# Patient Record
Sex: Male | Born: 1960 | Race: White | Hispanic: No | Marital: Married | Smoking: Never smoker
Health system: Southern US, Community
[De-identification: ages and names within clinical notes are randomized; demographics above are authoritative.]

## PROBLEM LIST (undated history)

## (undated) DIAGNOSIS — E782 Mixed hyperlipidemia: Secondary | ICD-10-CM

## (undated) DIAGNOSIS — K5792 Diverticulitis of intestine, part unspecified, without perforation or abscess without bleeding: Secondary | ICD-10-CM

## (undated) DIAGNOSIS — I2510 Atherosclerotic heart disease of native coronary artery without angina pectoris: Secondary | ICD-10-CM

## (undated) DIAGNOSIS — D171 Benign lipomatous neoplasm of skin and subcutaneous tissue of trunk: Secondary | ICD-10-CM

## (undated) DIAGNOSIS — R0602 Shortness of breath: Secondary | ICD-10-CM

## (undated) DIAGNOSIS — C439 Malignant melanoma of skin, unspecified: Secondary | ICD-10-CM

## (undated) DIAGNOSIS — M1990 Unspecified osteoarthritis, unspecified site: Secondary | ICD-10-CM

## (undated) DIAGNOSIS — I351 Nonrheumatic aortic (valve) insufficiency: Secondary | ICD-10-CM

## (undated) DIAGNOSIS — E785 Hyperlipidemia, unspecified: Secondary | ICD-10-CM

## (undated) DIAGNOSIS — Z8719 Personal history of other diseases of the digestive system: Secondary | ICD-10-CM

## (undated) DIAGNOSIS — I340 Nonrheumatic mitral (valve) insufficiency: Secondary | ICD-10-CM

## (undated) DIAGNOSIS — Z973 Presence of spectacles and contact lenses: Secondary | ICD-10-CM

## (undated) DIAGNOSIS — E119 Type 2 diabetes mellitus without complications: Secondary | ICD-10-CM

## (undated) DIAGNOSIS — N289 Disorder of kidney and ureter, unspecified: Secondary | ICD-10-CM

## (undated) DIAGNOSIS — I10 Essential (primary) hypertension: Secondary | ICD-10-CM

## (undated) DIAGNOSIS — Z85828 Personal history of other malignant neoplasm of skin: Secondary | ICD-10-CM

## (undated) DIAGNOSIS — Z87442 Personal history of urinary calculi: Secondary | ICD-10-CM

## (undated) DIAGNOSIS — C779 Secondary and unspecified malignant neoplasm of lymph node, unspecified: Secondary | ICD-10-CM

## (undated) DIAGNOSIS — K5730 Diverticulosis of large intestine without perforation or abscess without bleeding: Secondary | ICD-10-CM

## (undated) HISTORY — PX: SEPTOPLASTY: SUR1290

## (undated) HISTORY — PX: OTHER SURGICAL HISTORY: SHX169

## (undated) HISTORY — PX: CORONARY ANGIOPLASTY WITH STENT PLACEMENT: SHX49

## (undated) HISTORY — PX: EYE SURGERY: SHX253

## (undated) HISTORY — DX: Malignant melanoma of skin, unspecified: C43.9

## (undated) HISTORY — PX: ULNAR TUNNEL RELEASE: SHX820

## (undated) HISTORY — DX: Atherosclerotic heart disease of native coronary artery without angina pectoris: I25.10

## (undated) HISTORY — DX: Secondary and unspecified malignant neoplasm of lymph node, unspecified: C77.9

## (undated) HISTORY — PX: TONSILLECTOMY: SUR1361

## (undated) HISTORY — DX: Essential (primary) hypertension: I10

## (undated) HISTORY — PX: KNEE ARTHROSCOPY: SUR90

## (undated) HISTORY — DX: Nonrheumatic mitral (valve) insufficiency: I34.0

## (undated) HISTORY — PX: ROTATOR CUFF REPAIR: SHX139

## (undated) HISTORY — DX: Hyperlipidemia, unspecified: E78.5

---

## 1983-07-22 HISTORY — PX: OTHER SURGICAL HISTORY: SHX169

## 2000-12-21 ENCOUNTER — Encounter: Admission: RE | Admit: 2000-12-21 | Discharge: 2001-02-17 | Payer: Self-pay | Admitting: Internal Medicine

## 2001-12-03 ENCOUNTER — Encounter: Payer: Self-pay | Admitting: Emergency Medicine

## 2001-12-03 ENCOUNTER — Observation Stay (HOSPITAL_COMMUNITY): Admission: EM | Admit: 2001-12-03 | Discharge: 2001-12-04 | Payer: Self-pay | Admitting: Emergency Medicine

## 2003-05-26 ENCOUNTER — Encounter: Admission: RE | Admit: 2003-05-26 | Discharge: 2003-05-26 | Payer: Self-pay | Admitting: Internal Medicine

## 2003-06-16 ENCOUNTER — Encounter: Admission: RE | Admit: 2003-06-16 | Discharge: 2003-06-16 | Payer: Self-pay | Admitting: Internal Medicine

## 2003-07-10 ENCOUNTER — Encounter: Admission: RE | Admit: 2003-07-10 | Discharge: 2003-10-08 | Payer: Self-pay | Admitting: Internal Medicine

## 2004-04-26 ENCOUNTER — Emergency Department: Payer: Self-pay | Admitting: Unknown Physician Specialty

## 2004-05-10 ENCOUNTER — Encounter: Admission: RE | Admit: 2004-05-10 | Discharge: 2004-05-10 | Payer: Self-pay | Admitting: Internal Medicine

## 2005-04-09 ENCOUNTER — Emergency Department (HOSPITAL_COMMUNITY): Admission: EM | Admit: 2005-04-09 | Discharge: 2005-04-09 | Payer: Self-pay | Admitting: Emergency Medicine

## 2005-05-15 ENCOUNTER — Encounter: Admission: RE | Admit: 2005-05-15 | Discharge: 2005-05-15 | Payer: Self-pay | Admitting: Family Medicine

## 2005-12-03 ENCOUNTER — Emergency Department (HOSPITAL_COMMUNITY): Admission: EM | Admit: 2005-12-03 | Discharge: 2005-12-04 | Payer: Self-pay | Admitting: Emergency Medicine

## 2006-03-26 ENCOUNTER — Emergency Department (HOSPITAL_COMMUNITY): Admission: EM | Admit: 2006-03-26 | Discharge: 2006-03-26 | Payer: Self-pay | Admitting: Emergency Medicine

## 2006-10-23 ENCOUNTER — Emergency Department (HOSPITAL_COMMUNITY): Admission: EM | Admit: 2006-10-23 | Discharge: 2006-10-23 | Payer: Self-pay | Admitting: Family Medicine

## 2007-06-16 ENCOUNTER — Ambulatory Visit: Payer: Self-pay | Admitting: Oncology

## 2007-07-27 LAB — COMPREHENSIVE METABOLIC PANEL
CO2: 24 mEq/L (ref 19–32)
Calcium: 9.9 mg/dL (ref 8.4–10.5)
Chloride: 104 mEq/L (ref 96–112)
Creatinine, Ser: 1 mg/dL (ref 0.40–1.50)
Glucose, Bld: 90 mg/dL (ref 70–99)
Total Bilirubin: 0.4 mg/dL (ref 0.3–1.2)
Total Protein: 7.6 g/dL (ref 6.0–8.3)

## 2007-07-27 LAB — CBC WITH DIFFERENTIAL/PLATELET
Eosinophils Absolute: 0.1 10*3/uL (ref 0.0–0.5)
HCT: 40.6 % (ref 38.7–49.9)
HGB: 13.9 g/dL (ref 13.0–17.1)
LYMPH%: 29.5 % (ref 14.0–48.0)
MONO#: 0.6 10*3/uL (ref 0.1–0.9)
NEUT#: 2.5 10*3/uL (ref 1.5–6.5)
NEUT%: 53.8 % (ref 40.0–75.0)
Platelets: 150 10*3/uL (ref 145–400)
WBC: 4.6 10*3/uL (ref 4.0–10.0)
lymph#: 1.4 10*3/uL (ref 0.9–3.3)

## 2007-07-27 LAB — LACTATE DEHYDROGENASE: LDH: 159 U/L (ref 94–250)

## 2007-11-25 ENCOUNTER — Encounter: Admission: RE | Admit: 2007-11-25 | Discharge: 2007-11-25 | Payer: Self-pay | Admitting: Family Medicine

## 2007-12-20 ENCOUNTER — Ambulatory Visit: Payer: Self-pay | Admitting: Oncology

## 2008-06-21 ENCOUNTER — Ambulatory Visit: Payer: Self-pay | Admitting: Oncology

## 2008-07-03 ENCOUNTER — Ambulatory Visit (HOSPITAL_COMMUNITY): Admission: RE | Admit: 2008-07-03 | Discharge: 2008-07-03 | Payer: Self-pay | Admitting: Oncology

## 2008-07-10 ENCOUNTER — Encounter (INDEPENDENT_AMBULATORY_CARE_PROVIDER_SITE_OTHER): Payer: Self-pay | Admitting: Diagnostic Radiology

## 2008-07-10 ENCOUNTER — Ambulatory Visit (HOSPITAL_COMMUNITY): Admission: RE | Admit: 2008-07-10 | Discharge: 2008-07-10 | Payer: Self-pay | Admitting: Oncology

## 2008-10-16 ENCOUNTER — Emergency Department (HOSPITAL_COMMUNITY): Admission: EM | Admit: 2008-10-16 | Discharge: 2008-10-16 | Payer: Self-pay | Admitting: Emergency Medicine

## 2008-12-14 ENCOUNTER — Ambulatory Visit: Payer: Self-pay | Admitting: Oncology

## 2008-12-19 LAB — COMPREHENSIVE METABOLIC PANEL
Alkaline Phosphatase: 52 U/L (ref 39–117)
BUN: 10 mg/dL (ref 6–23)
Creatinine, Ser: 0.95 mg/dL (ref 0.40–1.50)
Glucose, Bld: 149 mg/dL — ABNORMAL HIGH (ref 70–99)
Sodium: 140 mEq/L (ref 135–145)
Total Bilirubin: 0.7 mg/dL (ref 0.3–1.2)
Total Protein: 7 g/dL (ref 6.0–8.3)

## 2008-12-19 LAB — CBC WITH DIFFERENTIAL/PLATELET
Eosinophils Absolute: 0.1 10*3/uL (ref 0.0–0.5)
LYMPH%: 34 % (ref 14.0–49.0)
MCV: 88.7 fL (ref 79.3–98.0)
MONO%: 12.2 % (ref 0.0–14.0)
NEUT#: 2 10*3/uL (ref 1.5–6.5)
NEUT%: 50.4 % (ref 39.0–75.0)
Platelets: 115 10*3/uL — ABNORMAL LOW (ref 140–400)
RBC: 4.15 10*6/uL — ABNORMAL LOW (ref 4.20–5.82)

## 2009-02-12 ENCOUNTER — Encounter: Admission: RE | Admit: 2009-02-12 | Discharge: 2009-02-12 | Payer: Self-pay | Admitting: Podiatry

## 2009-06-19 ENCOUNTER — Ambulatory Visit: Payer: Self-pay | Admitting: Oncology

## 2009-06-21 LAB — COMPREHENSIVE METABOLIC PANEL
Albumin: 4.4 g/dL (ref 3.5–5.2)
BUN: 18 mg/dL (ref 6–23)
CO2: 31 mEq/L (ref 19–32)
Glucose, Bld: 188 mg/dL — ABNORMAL HIGH (ref 70–99)
Sodium: 138 mEq/L (ref 135–145)
Total Bilirubin: 0.7 mg/dL (ref 0.3–1.2)
Total Protein: 7.6 g/dL (ref 6.0–8.3)

## 2009-06-21 LAB — CBC WITH DIFFERENTIAL/PLATELET
Basophils Absolute: 0 10*3/uL (ref 0.0–0.1)
Eosinophils Absolute: 0.1 10*3/uL (ref 0.0–0.5)
HCT: 42 % (ref 38.4–49.9)
HGB: 14.6 g/dL (ref 13.0–17.1)
LYMPH%: 29.3 % (ref 14.0–49.0)
MCV: 87.5 fL (ref 79.3–98.0)
MONO#: 0.5 10*3/uL (ref 0.1–0.9)
MONO%: 9.8 % (ref 0.0–14.0)
NEUT#: 3 10*3/uL (ref 1.5–6.5)
Platelets: 122 10*3/uL — ABNORMAL LOW (ref 140–400)
RBC: 4.8 10*6/uL (ref 4.20–5.82)
WBC: 5.2 10*3/uL (ref 4.0–10.3)

## 2009-06-21 LAB — LACTATE DEHYDROGENASE: LDH: 149 U/L (ref 94–250)

## 2009-12-21 ENCOUNTER — Ambulatory Visit: Payer: Self-pay | Admitting: Oncology

## 2009-12-25 LAB — CBC WITH DIFFERENTIAL/PLATELET
Basophils Absolute: 0 10*3/uL (ref 0.0–0.1)
EOS%: 2.1 % (ref 0.0–7.0)
Eosinophils Absolute: 0.1 10*3/uL (ref 0.0–0.5)
HGB: 13.9 g/dL (ref 13.0–17.1)
NEUT#: 2.4 10*3/uL (ref 1.5–6.5)
RBC: 4.55 10*6/uL (ref 4.20–5.82)
RDW: 12.9 % (ref 11.0–14.6)
lymph#: 1.3 10*3/uL (ref 0.9–3.3)

## 2009-12-25 LAB — COMPREHENSIVE METABOLIC PANEL
AST: 24 U/L (ref 0–37)
Albumin: 4.6 g/dL (ref 3.5–5.2)
Alkaline Phosphatase: 70 U/L (ref 39–117)
BUN: 14 mg/dL (ref 6–23)
Calcium: 9.4 mg/dL (ref 8.4–10.5)
Chloride: 102 mEq/L (ref 96–112)
Glucose, Bld: 367 mg/dL — ABNORMAL HIGH (ref 70–99)
Potassium: 4.1 mEq/L (ref 3.5–5.3)
Sodium: 136 mEq/L (ref 135–145)
Total Protein: 7.5 g/dL (ref 6.0–8.3)

## 2009-12-26 ENCOUNTER — Emergency Department (HOSPITAL_COMMUNITY): Admission: EM | Admit: 2009-12-26 | Discharge: 2009-12-26 | Payer: Self-pay | Admitting: Emergency Medicine

## 2009-12-27 ENCOUNTER — Emergency Department (HOSPITAL_COMMUNITY): Admission: EM | Admit: 2009-12-27 | Discharge: 2009-12-27 | Payer: Self-pay | Admitting: Emergency Medicine

## 2010-02-20 ENCOUNTER — Encounter: Admission: RE | Admit: 2010-02-20 | Discharge: 2010-02-20 | Payer: Self-pay | Admitting: Orthopedic Surgery

## 2010-04-13 ENCOUNTER — Emergency Department (HOSPITAL_COMMUNITY): Admission: EM | Admit: 2010-04-13 | Discharge: 2010-04-13 | Payer: Self-pay | Admitting: Emergency Medicine

## 2010-05-08 ENCOUNTER — Encounter: Admission: RE | Admit: 2010-05-08 | Discharge: 2010-05-08 | Payer: Self-pay | Admitting: Family Medicine

## 2010-07-03 ENCOUNTER — Ambulatory Visit: Payer: Self-pay | Admitting: Oncology

## 2010-07-05 LAB — CBC WITH DIFFERENTIAL/PLATELET
BASO%: 0.4 % (ref 0.0–2.0)
EOS%: 2.3 % (ref 0.0–7.0)
Eosinophils Absolute: 0.1 10*3/uL (ref 0.0–0.5)
LYMPH%: 31.2 % (ref 14.0–49.0)
MCH: 30.7 pg (ref 27.2–33.4)
MCHC: 34.5 g/dL (ref 32.0–36.0)
MCV: 89 fL (ref 79.3–98.0)
MONO%: 11.8 % (ref 0.0–14.0)
NEUT#: 2.3 10*3/uL (ref 1.5–6.5)
Platelets: 110 10*3/uL — ABNORMAL LOW (ref 140–400)
RBC: 4.33 10*6/uL (ref 4.20–5.82)
RDW: 13.3 % (ref 11.0–14.6)

## 2010-07-05 LAB — COMPREHENSIVE METABOLIC PANEL
ALT: 41 U/L (ref 0–53)
AST: 25 U/L (ref 0–37)
Albumin: 4.1 g/dL (ref 3.5–5.2)
Alkaline Phosphatase: 57 U/L (ref 39–117)
Glucose, Bld: 291 mg/dL — ABNORMAL HIGH (ref 70–99)
Potassium: 4.4 mEq/L (ref 3.5–5.3)
Sodium: 138 mEq/L (ref 135–145)
Total Bilirubin: 0.5 mg/dL (ref 0.3–1.2)
Total Protein: 7.1 g/dL (ref 6.0–8.3)

## 2010-08-11 ENCOUNTER — Encounter: Payer: Self-pay | Admitting: Oncology

## 2010-08-12 ENCOUNTER — Encounter: Payer: Self-pay | Admitting: Oncology

## 2010-09-18 ENCOUNTER — Emergency Department (HOSPITAL_COMMUNITY): Payer: BC Managed Care – PPO

## 2010-09-18 ENCOUNTER — Emergency Department (HOSPITAL_COMMUNITY)
Admission: EM | Admit: 2010-09-18 | Discharge: 2010-09-18 | Disposition: A | Discharge status: Home / Self Care | Payer: BC Managed Care – PPO | Attending: Emergency Medicine | Admitting: Emergency Medicine

## 2010-09-18 DIAGNOSIS — E119 Type 2 diabetes mellitus without complications: Secondary | ICD-10-CM | POA: Insufficient documentation

## 2010-09-18 DIAGNOSIS — N201 Calculus of ureter: Secondary | ICD-10-CM | POA: Insufficient documentation

## 2010-09-18 DIAGNOSIS — N1330 Unspecified hydronephrosis: Secondary | ICD-10-CM | POA: Insufficient documentation

## 2010-09-18 DIAGNOSIS — R109 Unspecified abdominal pain: Secondary | ICD-10-CM | POA: Insufficient documentation

## 2010-09-18 DIAGNOSIS — E7800 Pure hypercholesterolemia, unspecified: Secondary | ICD-10-CM | POA: Insufficient documentation

## 2010-09-18 DIAGNOSIS — M549 Dorsalgia, unspecified: Secondary | ICD-10-CM | POA: Insufficient documentation

## 2010-09-18 DIAGNOSIS — Z79899 Other long term (current) drug therapy: Secondary | ICD-10-CM | POA: Insufficient documentation

## 2010-09-18 LAB — URINALYSIS, ROUTINE W REFLEX MICROSCOPIC
Bilirubin Urine: NEGATIVE
Protein, ur: NEGATIVE mg/dL
Urine Glucose, Fasting: NEGATIVE mg/dL
Urobilinogen, UA: 0.2 mg/dL (ref 0.0–1.0)

## 2010-09-18 LAB — POCT I-STAT, CHEM 8
Calcium, Ion: 1.17 mmol/L (ref 1.12–1.32)
Glucose, Bld: 128 mg/dL — ABNORMAL HIGH (ref 70–99)
HCT: 43 % (ref 39.0–52.0)
Hemoglobin: 14.6 g/dL (ref 13.0–17.0)

## 2010-09-18 LAB — URINE MICROSCOPIC-ADD ON

## 2010-09-19 HISTORY — PX: EXTRACORPOREAL SHOCK WAVE LITHOTRIPSY: SHX1557

## 2010-09-26 ENCOUNTER — Ambulatory Visit (HOSPITAL_COMMUNITY)
Admission: RE | Admit: 2010-09-26 | Discharge: 2010-09-26 | Disposition: A | Discharge status: Home / Self Care | Payer: BC Managed Care – PPO | Source: Ambulatory Visit | Attending: Urology | Admitting: Urology

## 2010-09-26 DIAGNOSIS — E119 Type 2 diabetes mellitus without complications: Secondary | ICD-10-CM | POA: Insufficient documentation

## 2010-09-26 DIAGNOSIS — N201 Calculus of ureter: Secondary | ICD-10-CM | POA: Insufficient documentation

## 2010-09-26 LAB — GLUCOSE, CAPILLARY: Glucose-Capillary: 122 mg/dL — ABNORMAL HIGH (ref 70–99)

## 2010-10-03 LAB — URINALYSIS, ROUTINE W REFLEX MICROSCOPIC
Bilirubin Urine: NEGATIVE
Protein, ur: NEGATIVE mg/dL
Specific Gravity, Urine: 1.022 (ref 1.005–1.030)
Urobilinogen, UA: 0.2 mg/dL (ref 0.0–1.0)

## 2010-10-03 LAB — URINE CULTURE: Culture  Setup Time: 201109241749

## 2010-10-03 LAB — POCT I-STAT, CHEM 8
Creatinine, Ser: 1.1 mg/dL (ref 0.4–1.5)
Glucose, Bld: 179 mg/dL — ABNORMAL HIGH (ref 70–99)
Hemoglobin: 15.3 g/dL (ref 13.0–17.0)
TCO2: 30 mmol/L (ref 0–100)

## 2010-10-03 LAB — URINE MICROSCOPIC-ADD ON

## 2010-10-07 LAB — URINALYSIS, ROUTINE W REFLEX MICROSCOPIC
Bilirubin Urine: NEGATIVE
Glucose, UA: 250 mg/dL — AB
Glucose, UA: 500 mg/dL — AB
Hgb urine dipstick: NEGATIVE
Ketones, ur: NEGATIVE mg/dL
Leukocytes, UA: NEGATIVE
Nitrite: NEGATIVE
Nitrite: NEGATIVE
Specific Gravity, Urine: 1.026 (ref 1.005–1.030)
pH: 5 (ref 5.0–8.0)
pH: 5 (ref 5.0–8.0)

## 2010-10-07 LAB — URINE MICROSCOPIC-ADD ON

## 2010-10-07 LAB — POCT I-STAT, CHEM 8
BUN: 15 mg/dL (ref 6–23)
Chloride: 106 mEq/L (ref 96–112)
HCT: 42 % (ref 39.0–52.0)
Potassium: 4.6 mEq/L (ref 3.5–5.1)
Sodium: 138 mEq/L (ref 135–145)

## 2010-10-07 LAB — GLUCOSE, CAPILLARY

## 2010-10-31 LAB — POCT I-STAT, CHEM 8
BUN: 16 mg/dL (ref 6–23)
Chloride: 104 mEq/L (ref 96–112)
Creatinine, Ser: 1 mg/dL (ref 0.4–1.5)
Potassium: 3.9 mEq/L (ref 3.5–5.1)
Sodium: 138 mEq/L (ref 135–145)
TCO2: 25 mmol/L (ref 0–100)

## 2010-10-31 LAB — URINALYSIS, ROUTINE W REFLEX MICROSCOPIC
Bilirubin Urine: NEGATIVE
Hgb urine dipstick: NEGATIVE
Ketones, ur: 15 mg/dL — AB
Nitrite: NEGATIVE
Urobilinogen, UA: 1 mg/dL (ref 0.0–1.0)

## 2010-10-31 LAB — COMPREHENSIVE METABOLIC PANEL
ALT: 26 U/L (ref 0–53)
CO2: 27 mEq/L (ref 19–32)
Calcium: 9.3 mg/dL (ref 8.4–10.5)
Creatinine, Ser: 1 mg/dL (ref 0.4–1.5)
GFR calc Af Amer: >60 mL/min (ref >60)
GFR calc non Af Amer: >60 mL/min (ref >60)
Glucose, Bld: 233 mg/dL — ABNORMAL HIGH (ref 70–99)
Sodium: 138 mEq/L (ref 135–145)
Total Protein: 7 g/dL (ref 6.0–8.3)

## 2010-10-31 LAB — DIFFERENTIAL
Eosinophils Absolute: 0.2 10*3/uL (ref 0.0–0.7)
Lymphocytes Relative: 35 % (ref 12–46)
Lymphs Abs: 1.8 10*3/uL (ref 0.7–4.0)
Monocytes Relative: 10 % (ref 3–12)
Neutro Abs: 2.6 10*3/uL (ref 1.7–7.7)
Neutrophils Relative %: 51 % (ref 43–77)

## 2010-10-31 LAB — CBC
Hemoglobin: 13.8 g/dL (ref 13.0–17.0)
MCHC: 34.2 g/dL (ref 30.0–36.0)
MCV: 90.9 fL (ref 78.0–100.0)
RBC: 4.42 MIL/uL (ref 4.22–5.81)
RDW: 12.9 % (ref 11.5–15.5)

## 2010-10-31 LAB — LIPASE, BLOOD: Lipase: 37 U/L (ref 11–59)

## 2010-12-06 NOTE — H&P (Signed)
[place]  Patient:    Roy [name], Roy [name] Visit Number: [id number] MRN: [id number]          Service Type: MED Location: [phone] Attending Physician:  Marcelino Duster Md Dictated by:   Arturo Morton Riley Kill, M.D. [facility] Admit Date:  12/03/2001 Discharge Date: 12/04/2001   CC:         [facility]   History and Physical  CHIEF COMPLAINT:  Squeezing chest pain.  HISTORY OF PRESENT ILLNESS:  The patient is a 50 year old white male, father of three, with hypercholesterolemia and a positive family history.  The patient was upset at work because of a job change and developed some squeezing chest pain.  He has been exercising on the elliptical bike without any symptoms for 10 to 15 minutes and jumping rope without any difficulty.  This has been occurring for about two to three weeks and it was also brought on by an episode with a coach of his sons little league team.  He now feels fine for the past 36 hours.  He denies exertional chest pain.  This was described as a squeezing in the right parasternal area.  PAST MEDICAL HISTORY:  The patient hashad prior septoplasty.  He has had arm surgery.  He has had hypercholesterolemia.  The patient has had radial keratotomy to both eyes.  FAMILY HISTORY:  Father had PCI at age 1.  Mother has hypertension and lung cancer.  Paternal grandfather had CAD and died of an MI at 10.  He has two brothers who are alive and well.  REVIEW OF SYSTEMS:  Noncontributory.  He does have a history of migraines. His weight has been stable.  ALLERGIES:  ALCOHOL with hives.  CARDIAC RISK FACTORS:  The patient does not smoke.  There is no diabetes or hypertension.  PHYSICAL EXAMINATION:  GENERAL:  The patient is alert and oriented.  VITAL SIGNS:  The blood pressure is 155/92.  The pulse is 60 and regular.  He is afebrile.  Respiratory rate is 14 and unlabored.  NECK:  Carotid upstrokes are brisk.  There are no  bruits.  LUNGS:  Lung fields are clear to auscultation and percussion.  CARDIAC:  Thereis a normal first and second heart sound.  There is questionable S4.  ABDOMEN:  No hepatosplenomegaly.  EXTREMITIES:  No edema.  NEUROLOGIC:  Exam is nonfocal.  LABORATORY AND ACCESSORY DATA:  Review of the serial electrocardiograms demonstrates normal sinus rhythm with no acute changes.  There is borderline nonspecific T wave flattening in the final two EKGs which is nonspecific.  Laboratory data reveals normal CKs and troponins.  IMPRESSION: 1. Chest discomfort, ? cardiac in etiology or anxiety related. 2. Positive family history for coronary artery disease. 3. History of hypercholesterolemia.  PLAN:  I have discussed the plan with Dr. Shirlee Limerick.  I would recommend the patient have an in-hospital Cardiolite study but the patient is not willingto stay and wants to have this done as an outpatient.  He was informed of the potential issues and he understands them.  I would add an aspirin to his daily regimen.  I also think he should have lipid levels measured by his primary care physician, and initiation of statin therapy, if in fact he mets criteria.  I have passed this on to Dr. Letitia Libra to pass for his primary care physician, should he have problems in the ______ .  We will tryto arrange an outpatient Cardiolite as soon as it  can possibly be arranged.  ADDENDUM:  He was informed that arranging the outpatient study from a timing standpoint was uncertain. Dictated by:   Arturo Morton Riley Kill, M.D. [facility] Attending Physician:  Marcelino Duster Md DD:  12/04/01 TD:  12/05/01 Job: [id number]

## 2011-04-25 LAB — CBC
HCT: 38.2 % — ABNORMAL LOW (ref 39.0–52.0)
Hemoglobin: 12.8 g/dL — ABNORMAL LOW (ref 13.0–17.0)
MCV: 91.6 fL (ref 78.0–100.0)
WBC: 3.9 10*3/uL — ABNORMAL LOW (ref 4.0–10.5)

## 2011-04-25 LAB — CREATININE, SERUM: GFR calc non Af Amer: >60 mL/min (ref >60)

## 2011-04-25 LAB — BUN: BUN: 17 mg/dL (ref 6–23)

## 2011-04-25 LAB — GLUCOSE, CAPILLARY
Glucose-Capillary: 155 mg/dL — ABNORMAL HIGH (ref 70–99)
Glucose-Capillary: 197 mg/dL — ABNORMAL HIGH (ref 70–99)

## 2011-07-02 ENCOUNTER — Encounter: Payer: Self-pay | Admitting: *Deleted

## 2011-07-04 ENCOUNTER — Other Ambulatory Visit: Payer: Self-pay | Admitting: Oncology

## 2011-07-04 ENCOUNTER — Ambulatory Visit (HOSPITAL_BASED_OUTPATIENT_CLINIC_OR_DEPARTMENT_OTHER): Payer: BC Managed Care – PPO | Admitting: Oncology

## 2011-07-04 ENCOUNTER — Telehealth: Payer: Self-pay | Admitting: Oncology

## 2011-07-04 ENCOUNTER — Other Ambulatory Visit (HOSPITAL_BASED_OUTPATIENT_CLINIC_OR_DEPARTMENT_OTHER): Payer: BC Managed Care – PPO | Admitting: Lab

## 2011-07-04 VITALS — BP 138/88 | HR 95 | Temp 97.2°F | Ht 69.5 in | Wt 194.6 lb

## 2011-07-04 DIAGNOSIS — C8589 Other specified types of non-Hodgkin lymphoma, extranodal and solid organ sites: Secondary | ICD-10-CM

## 2011-07-04 DIAGNOSIS — C8299 Follicular lymphoma, unspecified, extranodal and solid organ sites: Secondary | ICD-10-CM

## 2011-07-04 DIAGNOSIS — C8590 Non-Hodgkin lymphoma, unspecified, unspecified site: Secondary | ICD-10-CM

## 2011-07-04 LAB — COMPREHENSIVE METABOLIC PANEL
AST: 17 U/L (ref 0–37)
Albumin: 4.3 g/dL (ref 3.5–5.2)
BUN: 10 mg/dL (ref 6–23)
CO2: 30 mEq/L (ref 19–32)
Calcium: 9.9 mg/dL (ref 8.4–10.5)
Chloride: 99 mEq/L (ref 96–112)
Creatinine, Ser: 0.92 mg/dL (ref 0.50–1.35)
Glucose, Bld: 306 mg/dL — ABNORMAL HIGH (ref 70–99)
Potassium: 4.1 mEq/L (ref 3.5–5.3)

## 2011-07-04 LAB — CBC WITH DIFFERENTIAL/PLATELET
Basophils Absolute: 0 10*3/uL (ref 0.0–0.1)
EOS%: 1.4 % (ref 0.0–7.0)
Eosinophils Absolute: 0.1 10*3/uL (ref 0.0–0.5)
HCT: 40.2 % (ref 38.4–49.9)
HGB: 13.9 g/dL (ref 13.0–17.1)
MONO#: 0.4 10*3/uL (ref 0.1–0.9)
NEUT#: 2.3 10*3/uL (ref 1.5–6.5)
NEUT%: 56.3 % (ref 39.0–75.0)
RDW: 12.8 % (ref 11.0–14.6)
WBC: 4 10*3/uL (ref 4.0–10.3)
lymph#: 1.3 10*3/uL (ref 0.9–3.3)

## 2011-07-04 LAB — LACTATE DEHYDROGENASE: LDH: 155 U/L (ref 94–250)

## 2011-07-04 NOTE — Progress Notes (Signed)
Hematology and Oncology Follow Up Visit  Roy [name] [id number] [birth year] 50 y.o. 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  He has been on active surveillance since 2009, due to asymptomatic lymphadenopathy.    Current therapy: Observation and surveillance.   Interim History: Roy [name] presents today for a follow-up visit.  We continued to follow Roy [name] for a low-grade follicular lymphoma that he had since 2007, according to our surveillance, but probably even further that according to what he was telling me that he has had lymphadenopathy dating back for the last 10 years.  He otherwise has not reported any symptoms of abdominal pain or distention.  He has not reported any early satiety.  He did not report any constitutional symptoms of fevers, chills, sweats or difficulty performing activity of daily living.  He has continued to work full time and really be completely asymptomatic at this time. He did have multiple kidney stones since 08/2010. Last CT scan  At that time showed no progression of his lymph nodes.   Medications: I have reviewed the patient's current medications. Current outpatient prescriptions:benazepril (LOTENSIN) 10 MG tablet, Take 10 mg by mouth daily.  , Disp: , Rfl: ;  metFORMIN (GLUCOPHAGE) 500 MG tablet, Take 500 mg by mouth 2 (two) times daily with a meal.  , Disp: , Rfl: ;  rosuvastatin (CRESTOR) 20 MG tablet, Take 10 mg by mouth daily. , Disp: , Rfl: ;  sitaGLIPtin (JANUVIA) 100 MG tablet, Take 100 mg by mouth daily.  , Disp: , Rfl:  glimepiride (AMARYL) 2 MG tablet, Take 2 mg by mouth daily before breakfast.  , Disp: , Rfl:   Allergies: Allergies no known allergies  Past Medical History, Surgical history, Social history, and Family History were reviewed and updated.  Review of  Systems: Constitutional:  Negative for fever, chills, night sweats, anorexia, weight loss, pain. Cardiovascular: no chest pain or dyspnea on exertion Respiratory: no cough, shortness of breath, or wheezing Neurological: no TIA or stroke symptoms Dermatological: negative ENT: negative Skin: Negative. Gastrointestinal: no abdominal pain, change in bowel habits, or black or bloody stools Genito-Urinary: no dysuria, trouble voiding, or hematuria Hematological and Lymphatic: negative Breast: negative Musculoskeletal: negative Remaining ROS negative. Physical Exam: Blood pressure 138/88, pulse 95, temperature 97.2 F (36.2 C), temperature source Oral, height 5' 9.5" (1.765 m), weight 194 lb 9.6 oz (88.27 kg). ECOG: 0 General appearance: alert Head: Normocephalic, without obvious abnormality, atraumatic Neck: no adenopathy, no carotid bruit, no JVD, supple, symmetrical, trachea midline and thyroid not enlarged, symmetric, no tenderness/mass/nodules Lymph nodes: Cervical, supraclavicular, and axillary nodes normal. Heart:regular rate and rhythm, S1, S2 normal, no murmur, click, rub or gallop Lung:chest clear, no wheezing, rales, normal symmetric air entry Abdomin: soft, non-tender, without masses or organomegaly EXT:no erythema, induration, or nodules   Lab Results: Lab Results  Component Value Date   WBC 4.0 07/04/2011   HGB 13.9 07/04/2011   HCT 40.2 07/04/2011   MCV 88.4 07/04/2011   PLT 120* 07/04/2011     Chemistry      Component Value Date/Time   NA 141 09/18/2010 0203   K 3.7 09/18/2010 0203   CL 101 09/18/2010 0203   CO2 30 07/05/2010 1529   BUN 17 09/18/2010 0203   CREATININE 1.2 09/18/2010 0203      Component  Value Date/Time   CALCIUM 9.5 07/05/2010 1529   ALKPHOS 57 07/05/2010 1529   AST 25 07/05/2010 1529   ALT 41 07/05/2010 1529   BILITOT 0.5 07/05/2010 1529         Impression and Plan:   A 50 year old gentleman with the following issues: 1.  [name].  He has continued to be asymptomatic from that standpoint.  I continued to educate Roy [name] about the signs and symptoms of progressive lymphoma, B symptoms and transformation that would include, but not limited to fevers, chills, unexpected weight loss, unexpected fatigue, progressive cytopenias, bleeding and shortness of breath.  All of these were educated and reiterated to Roy [name].  He is to report to me any symptoms between visits. 2.  Diabetes under reasonable control with Roy [name]. 3.  Followup will be in 9 months or sooner if needed to.    [facility], MD 12/14/20124:17 PM

## 2011-07-04 NOTE — Telephone Encounter (Signed)
gve the pt his sept 2013 appt calendar along with the ct scan appt

## 2011-07-22 DIAGNOSIS — I252 Old myocardial infarction: Secondary | ICD-10-CM

## 2011-07-22 HISTORY — DX: Old myocardial infarction: I25.2

## 2011-12-20 ENCOUNTER — Inpatient Hospital Stay: Payer: Self-pay | Admitting: Internal Medicine

## 2011-12-20 LAB — CBC
HGB: 14.2 g/dL (ref 13.0–18.0)
MCHC: 33.7 g/dL (ref 32.0–36.0)
MCV: 88 fL (ref 80–100)
RBC: 4.78 10*6/uL (ref 4.40–5.90)
RDW: 13.6 % (ref 11.5–14.5)
WBC: 4 10*3/uL (ref 3.8–10.6)

## 2011-12-20 LAB — COMPREHENSIVE METABOLIC PANEL
Albumin: 4.4 g/dL (ref 3.4–5.0)
Anion Gap: 10 (ref 7–16)
BUN: 15 mg/dL (ref 7–18)
Bilirubin,Total: 0.5 mg/dL (ref 0.2–1.0)
Calcium, Total: 9.6 mg/dL (ref 8.5–10.1)
Chloride: 102 mmol/L (ref 98–107)
Creatinine: 1.16 mg/dL (ref 0.60–1.30)
EGFR (African American): >60
Osmolality: 290 (ref 275–301)
Potassium: 4.9 mmol/L (ref 3.5–5.1)
SGOT(AST): 20 U/L (ref 15–37)
SGPT (ALT): 34 U/L
Sodium: 139 mmol/L (ref 136–145)
Total Protein: 8.2 g/dL (ref 6.4–8.2)

## 2011-12-20 LAB — PROTIME-INR: INR: 1

## 2011-12-21 DIAGNOSIS — R079 Chest pain, unspecified: Secondary | ICD-10-CM

## 2011-12-21 LAB — CBC WITH DIFFERENTIAL/PLATELET
Basophil #: 0 10*3/uL (ref 0.0–0.1)
Basophil %: 0.4 %
Eosinophil #: 0.1 10*3/uL (ref 0.0–0.7)
Eosinophil %: 2.6 %
HCT: 35.4 % — ABNORMAL LOW (ref 40.0–52.0)
HGB: 12.1 g/dL — ABNORMAL LOW (ref 13.0–18.0)
Lymphocyte #: 1.9 10*3/uL (ref 1.0–3.6)
Lymphocyte %: 44.7 %
MCH: 29.8 pg (ref 26.0–34.0)
MCHC: 34.2 g/dL (ref 32.0–36.0)
MCV: 87 fL (ref 80–100)
Monocyte #: 0.4 x10 3/mm (ref 0.2–1.0)
Monocyte %: 10.5 %
Neutrophil %: 41.8 %
Platelet: 101 10*3/uL — ABNORMAL LOW (ref 150–440)
RBC: 4.06 10*6/uL — ABNORMAL LOW (ref 4.40–5.90)
WBC: 4.2 10*3/uL (ref 3.8–10.6)

## 2011-12-21 LAB — LIPID PANEL
HDL Cholesterol: 37 mg/dL — ABNORMAL LOW (ref 40–60)
Triglycerides: 64 mg/dL (ref 0–200)
VLDL Cholesterol, Calc: 13 mg/dL (ref 5–40)

## 2011-12-21 LAB — BASIC METABOLIC PANEL
Anion Gap: 10 (ref 7–16)
BUN: 11 mg/dL (ref 7–18)
Chloride: 107 mmol/L (ref 98–107)
Co2: 25 mmol/L (ref 21–32)
Creatinine: 0.95 mg/dL (ref 0.60–1.30)
EGFR (African American): >60
EGFR (Non-African Amer.): >60
Glucose: 191 mg/dL — ABNORMAL HIGH (ref 65–99)
Osmolality: 288 (ref 275–301)

## 2011-12-21 LAB — TROPONIN I: Troponin-I: 1.77 ng/mL — ABNORMAL HIGH

## 2011-12-21 LAB — HEMOGLOBIN A1C: Hemoglobin A1C: 10.7 % — ABNORMAL HIGH (ref 4.2–6.3)

## 2011-12-21 LAB — PROTIME-INR
INR: 1
Prothrombin Time: 13.3 secs (ref 11.5–14.7)

## 2011-12-21 LAB — CK TOTAL AND CKMB (NOT AT ARMC)
CK, Total: 146 U/L (ref 35–232)
CK-MB: 7.5 ng/mL — ABNORMAL HIGH (ref 0.5–3.6)
CK-MB: 6.9 ng/mL — ABNORMAL HIGH (ref 0.5–3.6)

## 2011-12-21 LAB — APTT: Activated PTT: 128.3 secs — ABNORMAL HIGH (ref 23.6–35.9)

## 2011-12-22 DIAGNOSIS — I2510 Atherosclerotic heart disease of native coronary artery without angina pectoris: Secondary | ICD-10-CM

## 2011-12-22 HISTORY — PX: CARDIAC CATHETERIZATION: SHX172

## 2011-12-22 LAB — APTT
Activated PTT: >160 secs (ref 23.6–35.9)
Activated PTT: 72.5 secs — ABNORMAL HIGH (ref 23.6–35.9)

## 2011-12-23 ENCOUNTER — Encounter: Payer: Self-pay | Admitting: Cardiovascular Disease

## 2011-12-23 DIAGNOSIS — I214 Non-ST elevation (NSTEMI) myocardial infarction: Secondary | ICD-10-CM

## 2011-12-23 LAB — BASIC METABOLIC PANEL
Anion Gap: 9 (ref 7–16)
BUN: 15 mg/dL (ref 7–18)
Calcium, Total: 8.1 mg/dL — ABNORMAL LOW (ref 8.5–10.1)
Co2: 25 mmol/L (ref 21–32)
Creatinine: 0.94 mg/dL (ref 0.60–1.30)
EGFR (Non-African Amer.): >60
Osmolality: 283 (ref 275–301)
Potassium: 3.9 mmol/L (ref 3.5–5.1)
Sodium: 137 mmol/L (ref 136–145)

## 2011-12-23 LAB — CK TOTAL AND CKMB (NOT AT ARMC): CK, Total: 64 U/L (ref 35–232)

## 2011-12-23 LAB — PLATELET COUNT: Platelet: 130 10*3/uL — ABNORMAL LOW (ref 150–440)

## 2012-01-08 ENCOUNTER — Other Ambulatory Visit: Payer: BC Managed Care – PPO

## 2012-01-08 ENCOUNTER — Ambulatory Visit (INDEPENDENT_AMBULATORY_CARE_PROVIDER_SITE_OTHER): Payer: BC Managed Care – PPO | Admitting: Cardiovascular Disease

## 2012-01-08 ENCOUNTER — Encounter: Payer: Self-pay | Admitting: Cardiovascular Disease

## 2012-01-08 VITALS — BP 130/80 | HR 83 | Ht 69.5 in | Wt 183.0 lb

## 2012-01-08 DIAGNOSIS — R079 Chest pain, unspecified: Secondary | ICD-10-CM

## 2012-01-08 DIAGNOSIS — I2510 Atherosclerotic heart disease of native coronary artery without angina pectoris: Secondary | ICD-10-CM | POA: Insufficient documentation

## 2012-01-08 DIAGNOSIS — I10 Essential (primary) hypertension: Secondary | ICD-10-CM | POA: Insufficient documentation

## 2012-01-08 DIAGNOSIS — E119 Type 2 diabetes mellitus without complications: Secondary | ICD-10-CM

## 2012-01-08 DIAGNOSIS — E785 Hyperlipidemia, unspecified: Secondary | ICD-10-CM | POA: Insufficient documentation

## 2012-01-08 DIAGNOSIS — Z9861 Coronary angioplasty status: Secondary | ICD-10-CM

## 2012-01-08 MED ORDER — ATORVASTATIN CALCIUM 20 MG PO TABS: 20.0000 mg | ORAL_TABLET | Freq: Every day | ORAL | Status: DC

## 2012-01-08 NOTE — Assessment & Plan Note (Signed)
Recent drug-eluting stent to the OM1, 95% lesion. Residual 60-70% mid to distal left circumflex disease. We will discuss the residual disease with interventional cardiology to determine if intervention needed. He is currently asymptomatic.

## 2012-01-08 NOTE — Patient Instructions (Addendum)
You are doing well. When you run out of crestor, please start lipitor one a day (20 mg) Cholesterol check with Dr. Cliffton Asters in three months  At the beginning of Sept. decrease the aspirin to 81 mg x 2 with plavix  Please call us if you have new issues that need to be addressed before your next appt.  Your physician wants you to follow-up in: 6 months.  You will receive a reminder letter in the mail two months in advance. If you don't receive a letter, please call our office to schedule the follow-up appointment.

## 2012-01-08 NOTE — Progress Notes (Signed)
Patient ID: Roy [name], male    DOB: July 23, 1960, 51 y.o.   MRN: [id number]  HPI Comments: Roy [name] is a pleasant 51 year old gentleman with poorly controlled diabetes, hyperlipidemia, hypertension who presented to the emergency room 12/20/2011 with chest pain, elevated cardiac enzymes greater than 2, cardiac catheterization showing severe OM1 disease as well as moderate to severe , 60-70% mid left circumflex disease. Promus stance 3.0 x 8 mm was placed to the OM1. He was discharged with followup today.  Cholesterol in the hospital was 125, LDL 75, HDL 37, hemoglobin A1c 10.7  He reports that his diabetes is slowly improving. He was previously on prednisone for a neck infection. Hemoglobin A1c at that time was 13. Subsequently it has dropped to 10, now 9.8. He is trying to watch his diet. His weight has decreased from 215 pounds to 182 pounds on a diet supplement.  He reports having vague short-lived chest pain on the left and on the right since his discharge. Not associated with exertion. Otherwise he feels well with no complaints. Previous symptoms were jaw pain and left arm pain. This resolved after the catheterization and stent placement.  EKG shows normal sinus rhythm with rate 83 beats per minute with no significant ST or T wave changes   Outpatient Encounter Prescriptions as of 01/08/2012  Medication Sig Dispense Refill  . aspirin 325 MG tablet Take 325 mg by mouth daily.      . benazepril (LOTENSIN) 10 MG tablet Take 10 mg by mouth daily.        . clopidogrel (PLAVIX) 75 MG tablet Take 75 mg by mouth daily.      Marland Kitchen ibuprofen (ADVIL,MOTRIN) 200 MG tablet Take 200 mg by mouth every 6 (six) hours as needed.      . metoprolol succinate (TOPROL-XL) 25 MG 24 hr tablet Take 1 tablet by mouth Daily.      Marland Kitchen NITROSTAT 0.4 MG SL tablet Place 0.4 mg under the tongue every 5 (five) minutes as needed.       . pioglitazone (ACTOS) 15 MG tablet Take 15 mg by mouth daily.      . sitaGLIPtan-metformin  (JANUMET) 50-1000 MG per tablet Take 2 tablets by mouth 2 (two) times daily with a meal.      . tetrahydrozoline-zinc (VISINE-AC) 0.05-0.25 % ophthalmic solution Place 2 drops into both eyes 3 (three) times daily as needed.      .  rosuvastatin (CRESTOR) 20 MG tablet Take 10 mg by mouth daily.        Review of Systems  Constitutional: Negative.   HENT: Negative.   Eyes: Negative.   Respiratory: Negative.   Cardiovascular: Negative.   Gastrointestinal: Negative.   Musculoskeletal: Negative.   Skin: Negative.   Neurological: Negative.   Hematological: Negative.   Psychiatric/Behavioral: Negative.   All other systems reviewed and are negative.    BP 130/80  Pulse 83  Ht 5' 9.5" (1.765 m)  Wt 183 lb (83.008 kg)  BMI 26.64 kg/m2  Physical Exam  Nursing note and vitals reviewed. Constitutional: He is oriented to person, place, and time. He appears well-developed and well-nourished.  HENT:  Head: Normocephalic.  Nose: Nose normal.  Mouth/Throat: Oropharynx is clear and moist.  Eyes: Conjunctivae are normal. Pupils are equal, round, and reactive to light.  Neck: Normal range of motion. Neck supple. No JVD present.  Cardiovascular: Normal rate, regular rhythm, S1 normal, S2 normal, normal heart sounds and intact distal pulses.  Exam reveals no  gallop and no friction rub.   No murmur heard. Pulmonary/Chest: Effort normal and breath sounds normal. No respiratory distress. He has no wheezes. He has no rales. He exhibits no tenderness.  Abdominal: Soft. Bowel sounds are normal. He exhibits no distension. There is no tenderness.  Musculoskeletal: Normal range of motion. He exhibits no edema and no tenderness.  Lymphadenopathy:    He has no cervical adenopathy.  Neurological: He is alert and oriented to person, place, and time. Coordination normal.  Skin: Skin is warm and dry. No rash noted. No erythema.  Psychiatric: He has a normal mood and affect. His behavior is normal. Judgment and  thought content normal.           Assessment and Plan

## 2012-01-08 NOTE — Assessment & Plan Note (Signed)
Blood pressure is well controlled on today's visit. No changes made to the medications. 

## 2012-01-08 NOTE — Assessment & Plan Note (Signed)
He is working on his diabetes as aggressively as he can. We have recommended goal hemoglobin A1c less than 7.

## 2012-01-08 NOTE — Assessment & Plan Note (Signed)
Chest pain is atypical in nature. We have asked him to contact our office if he has any further jaw pain or left arm pain as this was his previous anginal symptoms

## 2012-01-08 NOTE — Assessment & Plan Note (Signed)
He has never tried Lipitor before. An effort to cut the cost of his medications down, we will change him to Lipitor 20 mg daily. Cholesterol should improve with weight loss and better diabetes control. Goal LDL less than 70.

## 2012-02-12 ENCOUNTER — Telehealth: Payer: Self-pay | Admitting: Cardiovascular Disease

## 2012-02-12 NOTE — Telephone Encounter (Signed)
Pt says he has been experiencing dizziness daily for the last few days.  He says it is unrelated to position changes. He has a BP cuff at home but has not been checking this He also says his Blood sugar in mornings, prior to taking PO DM meds, are usually around 100. He says these are much lower than what they used to be d/t weight loss and better diet control I advised him to contact PCP to discuss as well since this may be r/t too much DM meds/may need to be altered. He says he called PCP first and they advised him to call us I told him we could bring him in for BP check today/tomm.  He says he works in [employer] and works long hours.  He would prefer to check BP at home first then call us with results.  He will do this tonight and in am and call me back.  If BP is low, we will address. If it is w/in normal limits, I would suggest he f/u with PCP to discuss diabetes management.  Understanding verb. He will call us tomm with BP readings.

## 2012-02-12 NOTE — Telephone Encounter (Signed)
Pt has concerns about his medication and lightheadness. Thinks that his medications are interfering with each other.

## 2012-02-13 NOTE — Telephone Encounter (Signed)
Pt calling back to speak to nurse concerning meds and BP

## 2012-02-13 NOTE — Telephone Encounter (Signed)
Pt called back to say he checked his BP last night when he got home and it was 118/74 He did not check it this am He still reports occasional dizziness I reassured him this BP reading is good, although we would want more readings than this to see if BP is atying consistent He understands but also says he has lost a large amount of weight and has changed his diet quite a bhit for his Diabetes management. He feels DM meds may be "too much". He has had to come off of the oral DM aganets for 2 years at a time in the past for same reason. I told him I would send note to Dr. Cliffton Asters to make her aware and he will call their office Monday if no response by then.

## 2012-03-08 ENCOUNTER — Telehealth: Payer: Self-pay | Admitting: Oncology

## 2012-03-08 NOTE — Telephone Encounter (Signed)
S/w the pt and he is aware of his lab appt time before the scan in aug

## 2012-03-15 ENCOUNTER — Telehealth: Payer: Self-pay | Admitting: Cardiovascular Disease

## 2012-03-15 ENCOUNTER — Ambulatory Visit (INDEPENDENT_AMBULATORY_CARE_PROVIDER_SITE_OTHER): Payer: BC Managed Care – PPO

## 2012-03-15 VITALS — BP 130/80 | HR 80 | Ht 69.0 in | Wt 183.0 lb

## 2012-03-15 DIAGNOSIS — S40029A Contusion of unspecified upper arm, initial encounter: Secondary | ICD-10-CM

## 2012-03-15 DIAGNOSIS — T148XXA Other injury of unspecified body region, initial encounter: Secondary | ICD-10-CM

## 2012-03-15 NOTE — Telephone Encounter (Signed)
Pt describes bruising and tenderness that covers entire forearm as well as upper arm. He also c/o fingers of same extremity "turning blue". He says arm is tender to touch and does not remember hitting arm on anything.  He confirms compliance with both ASA and Plavix.  He will come in to office today at 4 pm for nurse visit to assess.

## 2012-03-15 NOTE — Progress Notes (Signed)
Pt here for assessment of spontaneous bruise on left arm. There is a very large bruise on inner aspect of left upper extremity associated with tenderness and raised area.  Pt does not recall recent trauma to area.  He is concerned b/c he is taking both ASA and Plavix and has had a recent bleed (last week) for which PCP is managing.  He says he had some rectal bleeding last week and went to PCP. Stool card/sample was provided and he is awaiting results.   CBC was obtained today and we will try to get results from PCP from stool as well. We will call pt with results. He also c/o worsening joint aches and pains since starting atorvastatin and wonders if this is related.  I told him I would let MD know. No other complaints.  Will await CBC results.

## 2012-03-15 NOTE — Patient Instructions (Addendum)
Await CBC results 

## 2012-03-15 NOTE — Telephone Encounter (Signed)
Pt states that he has a huge bruise on his arm. Wonders if it may be coming from his ASA. Also wants to know if he needs to stop plaviz or ASA for a colonoscopy

## 2012-03-16 LAB — CBC WITH DIFFERENTIAL/PLATELET
Basophils Absolute: 0 10*3/uL (ref 0.0–0.2)
Basos: 0 % (ref 0–3)
Eosinophils Absolute: 0.1 10*3/uL (ref 0.0–0.4)
Immature Grans (Abs): 0 10*3/uL (ref 0.0–0.1)
Lymphs: 29 % (ref 14–46)
MCH: 29.7 pg (ref 26.6–33.0)
MCHC: 32.7 g/dL (ref 31.5–35.7)
Monocytes: 12 % (ref 4–13)
Neutrophils Relative %: 55 % (ref 40–74)
RBC: 4.04 x10E6/uL — ABNORMAL LOW (ref 4.14–5.80)

## 2012-03-18 ENCOUNTER — Other Ambulatory Visit: Payer: Self-pay | Admitting: Cardiovascular Disease

## 2012-03-18 ENCOUNTER — Telehealth: Payer: Self-pay | Admitting: Cardiovascular Disease

## 2012-03-18 NOTE — Telephone Encounter (Signed)
See below and advise

## 2012-03-18 NOTE — Telephone Encounter (Signed)
Pt is to have a colonoscopy and they wants to know if pt is ok to hold plavix for a few days to have this or does he need to wait. Phone [phone] fax [phone]

## 2012-03-19 ENCOUNTER — Other Ambulatory Visit: Payer: Self-pay

## 2012-03-19 MED ORDER — CLOPIDOGREL BISULFATE 75 MG PO TABS: 75.0000 mg | ORAL_TABLET | Freq: Every day | ORAL | Status: DC

## 2012-03-19 NOTE — Telephone Encounter (Signed)
Refill sent for plavix  

## 2012-03-22 NOTE — Telephone Encounter (Signed)
DES stent Would not hold plavix until June 2014

## 2012-03-23 NOTE — Telephone Encounter (Signed)
Error

## 2012-03-23 NOTE — Telephone Encounter (Signed)
Roy [name] w/[facility] made aware.  Understanding verb.  She will inform pt.

## 2012-03-24 ENCOUNTER — Other Ambulatory Visit: Payer: Self-pay

## 2012-03-24 DIAGNOSIS — I25810 Atherosclerosis of coronary artery bypass graft(s) without angina pectoris: Secondary | ICD-10-CM

## 2012-03-25 ENCOUNTER — Ambulatory Visit (INDEPENDENT_AMBULATORY_CARE_PROVIDER_SITE_OTHER): Payer: BC Managed Care – PPO

## 2012-03-25 DIAGNOSIS — C8590 Non-Hodgkin lymphoma, unspecified, unspecified site: Secondary | ICD-10-CM

## 2012-03-25 DIAGNOSIS — I25810 Atherosclerosis of coronary artery bypass graft(s) without angina pectoris: Secondary | ICD-10-CM

## 2012-03-26 LAB — CBC WITH DIFFERENTIAL
Basos: 0 % (ref 0–3)
Eos: 2 % (ref 0–7)
Hemoglobin: 12.8 g/dL (ref 12.6–17.7)
Immature Grans (Abs): 0 10*3/uL (ref 0.0–0.1)
Immature Granulocytes: 0 % (ref 0–2)
Lymphocytes Absolute: 1.2 10*3/uL (ref 0.7–4.5)
MCH: 30.8 pg (ref 26.6–33.0)
MCV: 91 fL (ref 79–97)
Monocytes Absolute: 0.5 10*3/uL (ref 0.1–1.0)
Monocytes: 13 % (ref 4–13)
Neutrophils Relative %: 54 % (ref 40–74)
RBC: 4.15 x10E6/uL (ref 4.14–5.80)
WBC: 3.7 10*3/uL — ABNORMAL LOW (ref 4.0–10.5)

## 2012-03-30 ENCOUNTER — Other Ambulatory Visit: Payer: Self-pay | Admitting: Oncology

## 2012-03-30 ENCOUNTER — Ambulatory Visit (HOSPITAL_BASED_OUTPATIENT_CLINIC_OR_DEPARTMENT_OTHER): Payer: BC Managed Care – PPO | Admitting: Oncology

## 2012-03-30 ENCOUNTER — Ambulatory Visit (HOSPITAL_COMMUNITY)
Admission: RE | Admit: 2012-03-30 | Discharge: 2012-03-30 | Disposition: A | Discharge status: Home / Self Care | Payer: BC Managed Care – PPO | Source: Ambulatory Visit | Attending: Oncology | Admitting: Oncology

## 2012-03-30 ENCOUNTER — Telehealth: Payer: Self-pay | Admitting: Oncology

## 2012-03-30 ENCOUNTER — Other Ambulatory Visit (HOSPITAL_BASED_OUTPATIENT_CLINIC_OR_DEPARTMENT_OTHER): Payer: BC Managed Care – PPO | Admitting: Lab

## 2012-03-30 ENCOUNTER — Ambulatory Visit (HOSPITAL_COMMUNITY): Payer: BC Managed Care – PPO

## 2012-03-30 VITALS — BP 126/85 | HR 100 | Temp 96.2°F | Wt 194.4 lb

## 2012-03-30 DIAGNOSIS — C8589 Other specified types of non-Hodgkin lymphoma, extranodal and solid organ sites: Secondary | ICD-10-CM | POA: Insufficient documentation

## 2012-03-30 DIAGNOSIS — C8590 Non-Hodgkin lymphoma, unspecified, unspecified site: Secondary | ICD-10-CM

## 2012-03-30 DIAGNOSIS — K5730 Diverticulosis of large intestine without perforation or abscess without bleeding: Secondary | ICD-10-CM | POA: Insufficient documentation

## 2012-03-30 DIAGNOSIS — C8299 Follicular lymphoma, unspecified, extranodal and solid organ sites: Secondary | ICD-10-CM

## 2012-03-30 DIAGNOSIS — R911 Solitary pulmonary nodule: Secondary | ICD-10-CM | POA: Insufficient documentation

## 2012-03-30 DIAGNOSIS — I2510 Atherosclerotic heart disease of native coronary artery without angina pectoris: Secondary | ICD-10-CM | POA: Insufficient documentation

## 2012-03-30 DIAGNOSIS — N200 Calculus of kidney: Secondary | ICD-10-CM | POA: Insufficient documentation

## 2012-03-30 DIAGNOSIS — R599 Enlarged lymph nodes, unspecified: Secondary | ICD-10-CM | POA: Insufficient documentation

## 2012-03-30 LAB — CBC WITH DIFFERENTIAL/PLATELET
Basophils Absolute: 0 10*3/uL (ref 0.0–0.1)
EOS%: 2.7 % (ref 0.0–7.0)
Eosinophils Absolute: 0.1 10*3/uL (ref 0.0–0.5)
HCT: 39.1 % (ref 38.4–49.9)
HGB: 13.4 g/dL (ref 13.0–17.1)
MCH: 31.2 pg (ref 27.2–33.4)
NEUT#: 2 10*3/uL (ref 1.5–6.5)
NEUT%: 55 % (ref 39.0–75.0)
RDW: 13.6 % (ref 11.0–14.6)
lymph#: 1.1 10*3/uL (ref 0.9–3.3)

## 2012-03-30 LAB — COMPREHENSIVE METABOLIC PANEL (CC13)
Albumin: 4.1 g/dL (ref 3.5–5.0)
BUN: 16 mg/dL (ref 7.0–26.0)
CO2: 24 mEq/L (ref 22–29)
Calcium: 9.4 mg/dL (ref 8.4–10.4)
Chloride: 105 mEq/L (ref 98–107)
Creatinine: 1.1 mg/dL (ref 0.7–1.3)
Glucose: 116 mg/dl — ABNORMAL HIGH (ref 70–99)
Potassium: 4.5 mEq/L (ref 3.5–5.1)

## 2012-03-30 NOTE — Telephone Encounter (Signed)
Pt came by today and wants to be seen before Friday, talked to Dr. Clelia Croft, pt scheduled for today @ 1:30pm

## 2012-03-30 NOTE — Progress Notes (Signed)
Hematology and Oncology Follow Up Visit  Roy [name] [id number] 1961-03-18 51 y.o. 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  He has been on active surveillance since 2009, due to asymptomatic lymphadenopathy.  Current therapy: Observation and surveillance.  Interim History: Roy [name] presents today for a follow-up visit.  We continued to follow Roy [name] for a low-grade follicular lymphoma that he had since 2007, according to our surveillance, but probably even further that according to what he was telling me that he has had lymphadenopathy dating back for the last 10 years.  He otherwise has not reported any symptoms of abdominal pain or distention.  He has not reported any early satiety.  He did not report any constitutional symptoms of fevers, chills, sweats or difficulty performing activity of daily living.  He has continued to work full time and really be completely asymptomatic at this time. He did have an MI 2 months ago and he is fully recovered.    Medications: I have reviewed the patient's current medications. Current outpatient prescriptions:aspirin 325 MG tablet, Take 325 mg by mouth daily., Disp: , Rfl: ;  atorvastatin (LIPITOR) 20 MG tablet, Take 1 tablet (20 mg total) by mouth daily., Disp: 90 tablet, Rfl: 3;  benazepril (LOTENSIN) 10 MG tablet, Take 10 mg by mouth daily.  , Disp: , Rfl: ;  clopidogrel (PLAVIX) 75 MG tablet, Take 1 tablet (75 mg total) by mouth daily., Disp: 90 tablet, Rfl: 3 ibuprofen (ADVIL,MOTRIN) 200 MG tablet, Take 200 mg by mouth every 6 (six) hours as needed., Disp: , Rfl: ;  metoprolol succinate (TOPROL-XL) 25 MG 24 hr tablet, Take 1 tablet by mouth Daily., Disp: , Rfl: ;  NITROSTAT 0.4 MG SL tablet, Place 0.4 mg under the tongue every 5 (five) minutes as needed. , Disp: , Rfl:  ;  pioglitazone (ACTOS) 15 MG tablet, Take 15 mg by mouth daily., Disp: , Rfl:  sitaGLIPtan-metformin (JANUMET) 50-1000 MG per tablet, Take 2 tablets by mouth 2 (two) times daily with a meal., Disp: , Rfl: ;  tetrahydrozoline-zinc (VISINE-AC) 0.05-0.25 % ophthalmic solution, Place 2 drops into both eyes 3 (three) times daily as needed., Disp: , Rfl:   Allergies:  Allergies  Allergen Reactions  . Contrast Media (Iodinated Diagnostic Agents)     Past Medical History, Surgical history, Social history, and Family History were reviewed and updated.  Review of Systems: Constitutional:  Negative for fever, chills, night sweats, anorexia, weight loss, pain. Cardiovascular: no chest pain or dyspnea on exertion Respiratory: no cough, shortness of breath, or wheezing Neurological: no TIA or stroke symptoms Dermatological: negative ENT: negative Skin: Negative. Gastrointestinal: no abdominal pain, change in bowel habits, or black or bloody stools Genito-Urinary: no dysuria, trouble voiding, or hematuria Hematological and Lymphatic: negative Breast: negative Musculoskeletal: negative Remaining ROS negative. Physical Exam: Blood pressure 126/85, pulse 100, temperature 96.2 F (35.7 C), temperature source Oral, weight 194 lb 7 oz (88.196 kg). ECOG: 0 General appearance: alert Head: Normocephalic, without obvious abnormality, atraumatic Neck: no adenopathy, no carotid bruit, no JVD, supple, symmetrical, trachea midline and thyroid not enlarged, symmetric, no tenderness/mass/nodules Lymph nodes: Cervical, supraclavicular, and axillary nodes normal. Heart:regular rate and rhythm, S1, S2 normal, no murmur, click, rub or gallop Lung:chest clear, no wheezing, rales, normal symmetric air entry Abdomin: soft, non-tender, without masses or organomegaly  EXT:no erythema, induration, or nodules   Lab Results: Lab Results  Component Value Date   WBC 3.7* 03/30/2012   HGB 13.4 03/30/2012   HCT 39.1  03/30/2012   MCV 90.6 03/30/2012   PLT 100* 03/30/2012     Chemistry      Component Value Date/Time   NA 139 03/30/2012 0742   NA 136 07/04/2011 1531   K 4.5 03/30/2012 0742   K 4.1 07/04/2011 1531   CL 105 03/30/2012 0742   CL 99 07/04/2011 1531   CO2 24 03/30/2012 0742   CO2 30 07/04/2011 1531   BUN 16.0 03/30/2012 0742   BUN 10 07/04/2011 1531   CREATININE 1.1 03/30/2012 0742   CREATININE 0.92 07/04/2011 1531      Component Value Date/Time   CALCIUM 9.4 03/30/2012 0742   CALCIUM 9.9 07/04/2011 1531   ALKPHOS 57 03/30/2012 0742   ALKPHOS 72 07/04/2011 1531   AST 19 03/30/2012 0742   AST 17 07/04/2011 1531   ALT 19 03/30/2012 0742   ALT 32 07/04/2011 1531   BILITOT 0.60 03/30/2012 0742   BILITOT 0.3 07/04/2011 1531     CT CHEST, ABDOMEN AND PELVIS WITHOUT CONTRAST  Technique: Contiguous axial images of the chest abdomen and pelvis  were obtained without IV contrast administration.  Comparison: [facility] study of 09/18/2010. Most recent chest CT of  07/03/2008.  CT CHEST  Findings: Lung windows demonstrate a lingular nodule which measures  1.1 cm on image 30 and is unchanged back to 2009, consistent with a  benign etiology.  No new or enlarging nodules.  Soft tissue windows demonstrate mildly prominent left  supraclavicular nodes. Index node measures 6 mm on image 8 versus  8 mm on 07/03/2008.  No axillary adenopathy. A bovine arch. Normal heart size without  pericardial or pleural effusion. There is left circumflex coronary  artery stent. Age advanced LAD coronary artery atherosclerosis. 7  mm right paratracheal node on image 12 is unchanged.  A subcarinal node measures 1.1 cm on image 25 and is unchanged  (when remeasured).  Hilar regions are poorly evaluated secondary to unenhanced  technique.  IMPRESSION:  1. Prominent left supraclavicular and mediastinal nodes. These  are similar to decreased in size since 2009. Technically  indeterminate. Favored to be reactive.  2. A  lingular nodule which is similar to 2009, consistent with  benignity.  3. Age advanced coronary artery atherosclerosis with a left  circumflex coronary artery stent.  CT ABDOMEN AND PELVIS  Findings: Normal uninfused appearance of the liver. A splenule.  Normal stomach, pancreas, gallbladder, biliary tract, adrenal  glands. Numerous bilateral renal collecting system calculi. No  hydroureter or ureteric calculi.  Increased number and size of retroperitoneal nodes. An index low  left periaortic node measures 1.6 x 1.1 cm on image 82 versus 2.1 x  1.4 cm on the prior. A node positioned just inferior the left renal  vein measures 8 mm on image 66 versus 1.2 cm on the prior.  Scattered colonic diverticula. Normal terminal ileum. Normal small  bowel without abdominal ascites.  Improvement in mesenteric adenopathy. Index node measures 1.3 x  1.1 cm on image 77 versus 1.9 x 1.2 cm on 09/18/2010.  Mild extension of adenopathy along the left common iliac station.  1.3 cm node on image 88 measured 1.4 cm on the prior. Normal  urinary bladder and prostate. No significant free fluid. Partial  degenerative fusion of the right sacroiliac joint. Right rotator  cuff repair.  IMPRESSION:  1. Since 09/18/2010, improvement in abdominal pelvic adenopathy.  2. Renal calculi without urinary tract obstruction.     Impression and Plan:   A 51 year old gentleman with the following issues: 1.  Low-grade follicular lymphoma.  He has continued to be asymptomatic from that standpoint. CT scan showed improvement for the most part.  I continued to educate Mr. Gude about the signs and symptoms of progressive lymphoma, B symptoms and transformation that would include, but not limited to fevers, chills, unexpected weight loss, unexpected fatigue, progressive cytopenias, bleeding and shortness of breath.  All of these were educated and reiterated to Mr. Santa.  He is to report to me any symptoms between visits. 2.   Diabetes under reasonable control with Dr. Clarene Duke. 3.  Followup will be in 6 months or sooner if needed to.    Eli Hose, MD 9/10/20131:54 PM

## 2012-03-30 NOTE — Telephone Encounter (Signed)
S/w pt re appt for 09/28/2012.

## 2012-04-02 ENCOUNTER — Ambulatory Visit: Payer: BC Managed Care – PPO | Admitting: Oncology

## 2012-05-17 ENCOUNTER — Other Ambulatory Visit: Payer: Self-pay | Admitting: Orthopedic Surgery

## 2012-05-17 DIAGNOSIS — M25562 Pain in left knee: Secondary | ICD-10-CM

## 2012-05-19 ENCOUNTER — Ambulatory Visit
Admission: RE | Admit: 2012-05-19 | Discharge: 2012-05-19 | Disposition: A | Discharge status: Home / Self Care | Payer: BC Managed Care – PPO | Source: Ambulatory Visit | Attending: Orthopedic Surgery | Admitting: Orthopedic Surgery

## 2012-05-19 DIAGNOSIS — M25562 Pain in left knee: Secondary | ICD-10-CM

## 2012-05-20 ENCOUNTER — Other Ambulatory Visit: Payer: BC Managed Care – PPO

## 2012-05-31 ENCOUNTER — Ambulatory Visit: Payer: BC Managed Care – PPO | Admitting: Cardiovascular Disease

## 2012-06-01 ENCOUNTER — Telehealth: Payer: Self-pay | Admitting: Cardiovascular Disease

## 2012-06-01 NOTE — Telephone Encounter (Signed)
I spoke with Dr. Mariah Milling who says he did speak with anesthesia yesterday about pt. He explained pt should remain on plavix/asa for at least 1 year post stent before holding med for any procedure.  For this reason, surgery that was scheduled for today was cancelled. Pt is very upset. 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 He is unable to do these things d/t knee pain/issues. He also has issue of his insurance deductible./ Will have to wait a whole other year to have anything done b/c of $/insurance deductible I explained the reason behind Dr. Windell Hummingbird decision, that he will be at increased risk for stent restenosis/clot formation if we hold this sooner than 1 year of being on Plavix.  He understands this but needs this procedure done ASAP I told him I could discuss further with Dr. Mariah Milling to see if there are any alternatives (i.e. Heparinization, etc). We will also contact Dr. Valentina Gu (orthopedic MD) if needed.  I will call pt back once I speak with Dr. Mariah Milling.  Understanding verb.

## 2012-06-01 NOTE — Telephone Encounter (Signed)
Pt states he was supposed to have a knee surgery tomorrow.  However, pt states Dr. Mariah Milling had told the anesthesiologist not to do the procedure since he had a stint put in back in June.  Pt states he really needs to have this procedure for work and also for insurance reasons.  Please call pt back to discuss options.

## 2012-06-01 NOTE — Telephone Encounter (Signed)
Recent data presented at TCT showed that stopping dual antiplatelet therapy 6 months after third generation drug-eluting stents seems to be safe. However, the official FDA recommendation is still to continue 12 months but that might change in the near future.  The patient had a Promus Drug-eluting stent placed on 12/22/2011.  If the surgery is absolutely needed, Plavix can be stopped after December 3 of 2013. Aspirin 81 mg once daily should be continued. Plavix should be resumed after the surgery.

## 2012-06-01 NOTE — Telephone Encounter (Signed)
Discussed with Dr. Mariah Milling who suggests to have he and Dr. Kirke Corin review closer to evaluate risk post DES and holding Plavix for this surgery that is "absolutely necessary" (per pt) Stent was placed by Dr. Juliann Pares Can we hold 6 months post DES?

## 2012-06-02 NOTE — Telephone Encounter (Signed)
Pt informed. Understanding verb I will call Dr. Rogue Jury office to get fax number and will fax this info to them Pt will await their call

## 2012-06-02 NOTE — Telephone Encounter (Signed)
lmtcb

## 2012-06-11 ENCOUNTER — Encounter: Payer: Self-pay | Admitting: Cardiovascular Disease

## 2012-06-20 DIAGNOSIS — I5030 Unspecified diastolic (congestive) heart failure: Secondary | ICD-10-CM

## 2012-06-20 HISTORY — DX: Unspecified diastolic (congestive) heart failure: I50.30

## 2012-06-21 ENCOUNTER — Encounter (HOSPITAL_COMMUNITY): Payer: Self-pay

## 2012-06-22 ENCOUNTER — Other Ambulatory Visit: Payer: Self-pay | Admitting: Orthopedic Surgery

## 2012-06-23 ENCOUNTER — Encounter (HOSPITAL_COMMUNITY): Payer: Self-pay

## 2012-06-23 ENCOUNTER — Encounter (HOSPITAL_COMMUNITY)
Admission: RE | Admit: 2012-06-23 | Discharge: 2012-06-23 | Disposition: A | Discharge status: Home / Self Care | Payer: BC Managed Care – PPO | Source: Ambulatory Visit | Attending: Anesthesiology | Admitting: Anesthesiology

## 2012-06-23 ENCOUNTER — Encounter (HOSPITAL_COMMUNITY)
Admission: RE | Admit: 2012-06-23 | Discharge: 2012-06-23 | Disposition: A | Discharge status: Home / Self Care | Payer: BC Managed Care – PPO | Source: Ambulatory Visit | Attending: Orthopedic Surgery | Admitting: Orthopedic Surgery

## 2012-06-23 LAB — BASIC METABOLIC PANEL
BUN: 13 mg/dL (ref 6–23)
CO2: 25 mEq/L (ref 19–32)
Chloride: 102 mEq/L (ref 96–112)
Creatinine, Ser: 0.91 mg/dL (ref 0.50–1.35)

## 2012-06-23 LAB — CBC
HCT: 38.3 % — ABNORMAL LOW (ref 39.0–52.0)
Hemoglobin: 13.4 g/dL (ref 13.0–17.0)
MCHC: 35 g/dL (ref 30.0–36.0)
MCV: 86.5 fL (ref 78.0–100.0)
RDW: 12.7 % (ref 11.5–15.5)

## 2012-06-23 NOTE — Pre-Procedure Instructions (Signed)
21 Roy [name]  06/23/2012   Your procedure is scheduled on:  December 9  Report to [facility] at 08:30 AM.  Call this number if you have problems the morning of surgery: [phone]   Remember:   Do not eat or drink:After Midnight.  Take these medicines the morning of surgery with A SIP OF WATER: Metoprolol, Eye drops, Follow instructions from cardiology regarding Aspirin and Plavix   Do not wear jewelry, make-up or nail polish.  Do not wear lotions, powders, or perfumes. You may wear deodorant.  Do not shave 48 hours prior to surgery. Men may shave face and neck.  Do not bring valuables to the hospital.  Contacts, dentures or bridgework may not be worn into surgery.  Leave suitcase in the car. After surgery it may be brought to your room.  For patients admitted to the hospital, checkout time is 11:00 AM the day of discharge.   Patients discharged the day of surgery will not be allowed to drive home.  Name and phone number of your driver: Spouse  Special Instructions: Incentive Spirometry - Practice and bring it with you on the day of surgery. Shower using CHG 2 nights before surgery and the night before surgery.  If you shower the day of surgery use CHG.  Use special wash - you have one bottle of CHG for all showers.  You should use approximately 1/3 of the bottle for each shower.   Please read over the following fact sheets that you were given: Pain Booklet, Coughing and Deep Breathing and Surgical Site Infection Prevention

## 2012-06-23 NOTE — Progress Notes (Signed)
Requested Last OV and any cardiac test from [facility]

## 2012-06-24 ENCOUNTER — Encounter (HOSPITAL_COMMUNITY): Payer: Self-pay | Admitting: Vascular Surgery

## 2012-06-24 NOTE — Consult Note (Signed)
Anesthesia chart review: Patient is a 51 year old male scheduled for left knee arthroscopy for a medial meniscal tear on 06/28/2012 by Dr. Valentina Gu. History includes nonsmoker, diabetes mellitus type 2, hyperlipidemia, hypertension, CAD/NSTEMI 12/2011 s/p OM1 Promus stent, melanoma.    Cardiologist Dr. Lorine Bears was notified of planned procedure.  His recommendations included, "Recent data presented at TCT showed that stopping dual antiplatelet therapy 6 months after third-generation drug-eluting stents seems to be safe. However, the official FDA recommendation is still to continue 12 months but that might change in the near future. The patient had a Promus Drug-eluting stent placed on 12/22/2011. If the surgery is absolutely needed, Plavix can be stopped after December 3 of  2013. Aspirin 81 mg once daily should be continued. Plavix should be resumed after the surgery. "  EKG on 01/08/2012 showed normal sinus rhythm.  Cardiac cath at [facility] on 12/22/11 showed severe OM1 disease, moderate mid to distal left circumflex disease, mild mid LAD disease, normal ejection fraction, estimated at greater than 55%. No significant MR or AS.  Successful PCI stent to OM1 3.0-8 mm Promus stent.  Chest x-ray on 06/23/2012 showed normal heart size, lungs clear, no pleural effusion or pneumothorax, stable mild asymmetric prominence of the right first costal junction. No active cardiopulmonary process.  Preoperative labs noted.  If Dr. Sherlean Foot feels this procedure is "absolutely needed" and patient has no acute CV symptoms then would anticipate he can proceed as planned.  Shonna Chock, PA-C 06/24/12 1200

## 2012-06-28 ENCOUNTER — Encounter (HOSPITAL_COMMUNITY): Payer: Self-pay | Admitting: *Deleted

## 2012-06-28 ENCOUNTER — Ambulatory Visit (HOSPITAL_COMMUNITY)
Admission: RE | Admit: 2012-06-28 | Discharge: 2012-06-28 | Disposition: A | Discharge status: Home / Self Care | Payer: BC Managed Care – PPO | Source: Ambulatory Visit | Attending: Orthopedic Surgery | Admitting: Orthopedic Surgery

## 2012-06-28 ENCOUNTER — Ambulatory Visit (HOSPITAL_COMMUNITY): Payer: BC Managed Care – PPO | Admitting: Vascular Surgery

## 2012-06-28 ENCOUNTER — Encounter (HOSPITAL_COMMUNITY): Payer: Self-pay | Admitting: Vascular Surgery

## 2012-06-28 ENCOUNTER — Encounter (HOSPITAL_COMMUNITY)
Admission: RE | Disposition: A | Discharge status: Home / Self Care | Payer: Self-pay | Source: Ambulatory Visit | Attending: Orthopedic Surgery

## 2012-06-28 ENCOUNTER — Encounter (HOSPITAL_COMMUNITY): Payer: Self-pay | Admitting: Anesthesiology

## 2012-06-28 DIAGNOSIS — Z85820 Personal history of malignant melanoma of skin: Secondary | ICD-10-CM | POA: Insufficient documentation

## 2012-06-28 DIAGNOSIS — I252 Old myocardial infarction: Secondary | ICD-10-CM | POA: Insufficient documentation

## 2012-06-28 DIAGNOSIS — Z808 Family history of malignant neoplasm of other organs or systems: Secondary | ICD-10-CM | POA: Insufficient documentation

## 2012-06-28 DIAGNOSIS — Z801 Family history of malignant neoplasm of trachea, bronchus and lung: Secondary | ICD-10-CM | POA: Insufficient documentation

## 2012-06-28 DIAGNOSIS — S83242A Other tear of medial meniscus, current injury, left knee, initial encounter: Secondary | ICD-10-CM

## 2012-06-28 DIAGNOSIS — I2510 Atherosclerotic heart disease of native coronary artery without angina pectoris: Secondary | ICD-10-CM | POA: Insufficient documentation

## 2012-06-28 DIAGNOSIS — M2240 Chondromalacia patellae, unspecified knee: Secondary | ICD-10-CM | POA: Insufficient documentation

## 2012-06-28 DIAGNOSIS — M23305 Other meniscus derangements, unspecified medial meniscus, unspecified knee: Secondary | ICD-10-CM | POA: Insufficient documentation

## 2012-06-28 DIAGNOSIS — E785 Hyperlipidemia, unspecified: Secondary | ICD-10-CM | POA: Insufficient documentation

## 2012-06-28 DIAGNOSIS — Z01812 Encounter for preprocedural laboratory examination: Secondary | ICD-10-CM | POA: Insufficient documentation

## 2012-06-28 DIAGNOSIS — E119 Type 2 diabetes mellitus without complications: Secondary | ICD-10-CM | POA: Insufficient documentation

## 2012-06-28 DIAGNOSIS — Z8249 Family history of ischemic heart disease and other diseases of the circulatory system: Secondary | ICD-10-CM | POA: Insufficient documentation

## 2012-06-28 DIAGNOSIS — I10 Essential (primary) hypertension: Secondary | ICD-10-CM | POA: Insufficient documentation

## 2012-06-28 DIAGNOSIS — Z01818 Encounter for other preprocedural examination: Secondary | ICD-10-CM | POA: Insufficient documentation

## 2012-06-28 HISTORY — PX: KNEE ARTHROSCOPY: SHX127

## 2012-06-28 LAB — GLUCOSE, CAPILLARY: Glucose-Capillary: 146 mg/dL — ABNORMAL HIGH (ref 70–99)

## 2012-06-28 SURGERY — ARTHROSCOPY, KNEE
Anesthesia: General | Site: Knee | Laterality: Left | Wound class: Clean

## 2012-06-28 MED ORDER — ACETAMINOPHEN 10 MG/ML IV SOLN
1000.0000 mg | Freq: Four times a day (QID) | INTRAVENOUS | Status: DC
  Administered 2012-06-28: 1000 mg via INTRAVENOUS

## 2012-06-28 MED ORDER — CEFAZOLIN SODIUM-DEXTROSE 2-3 GM-% IV SOLR
INTRAVENOUS | Status: AC
  Filled 2012-06-28: qty 50

## 2012-06-28 MED ORDER — LACTATED RINGERS IV SOLN
INTRAVENOUS | Status: DC | PRN
  Administered 2012-06-28: 11:00:00 via INTRAVENOUS

## 2012-06-28 MED ORDER — OXYCODONE HCL 5 MG PO TABS: 5.0000 mg | ORAL_TABLET | Freq: Once | ORAL | Status: DC | PRN

## 2012-06-28 MED ORDER — BUPIVACAINE-EPINEPHRINE (PF) 0.5% -1:200000 IJ SOLN
INTRAMUSCULAR | Status: AC
  Filled 2012-06-28: qty 10

## 2012-06-28 MED ORDER — LACTATED RINGERS IV SOLN
INTRAVENOUS | Status: DC
  Administered 2012-06-28: 10:00:00 via INTRAVENOUS

## 2012-06-28 MED ORDER — SODIUM CHLORIDE 0.9 % IR SOLN
Status: DC | PRN
  Administered 2012-06-28 (×2): 3000 mL

## 2012-06-28 MED ORDER — HYDROCODONE-ACETAMINOPHEN 5-325 MG PO TABS: 1.0000 | ORAL_TABLET | ORAL | Status: DC | PRN

## 2012-06-28 MED ORDER — LIDOCAINE HCL (CARDIAC) 20 MG/ML IV SOLN
INTRAVENOUS | Status: DC | PRN
  Administered 2012-06-28: 100 mg via INTRAVENOUS

## 2012-06-28 MED ORDER — BUPIVACAINE-EPINEPHRINE 0.5% -1:200000 IJ SOLN
INTRAMUSCULAR | Status: DC | PRN
  Administered 2012-06-28: 10 mL

## 2012-06-28 MED ORDER — MIDAZOLAM HCL 5 MG/5ML IJ SOLN
INTRAMUSCULAR | Status: DC | PRN
  Administered 2012-06-28: 2 mg via INTRAVENOUS

## 2012-06-28 MED ORDER — OXYCODONE HCL 5 MG/5ML PO SOLN: 5.0000 mg | Freq: Once | ORAL | Status: DC | PRN

## 2012-06-28 MED ORDER — ONDANSETRON HCL 4 MG/2ML IJ SOLN
INTRAMUSCULAR | Status: DC | PRN
  Administered 2012-06-28: 4 mg via INTRAVENOUS

## 2012-06-28 MED ORDER — ONDANSETRON HCL 4 MG/2ML IJ SOLN: 4.0000 mg | Freq: Once | INTRAMUSCULAR | Status: DC | PRN

## 2012-06-28 MED ORDER — FENTANYL CITRATE 0.05 MG/ML IJ SOLN
INTRAMUSCULAR | Status: DC | PRN
  Administered 2012-06-28 (×2): 25 ug via INTRAVENOUS
  Administered 2012-06-28: 100 ug via INTRAVENOUS
  Administered 2012-06-28 (×2): 50 ug via INTRAVENOUS

## 2012-06-28 MED ORDER — HYDROMORPHONE HCL PF 1 MG/ML IJ SOLN: 0.2500 mg | INTRAMUSCULAR | Status: DC | PRN

## 2012-06-28 MED ORDER — ARTIFICIAL TEARS OP OINT
TOPICAL_OINTMENT | OPHTHALMIC | Status: DC | PRN
  Administered 2012-06-28: 1 via OPHTHALMIC

## 2012-06-28 MED ORDER — CHLORHEXIDINE GLUCONATE 4 % EX LIQD: 60.0000 mL | Freq: Once | CUTANEOUS | Status: DC

## 2012-06-28 MED ORDER — PROPOFOL 10 MG/ML IV BOLUS
INTRAVENOUS | Status: DC | PRN
  Administered 2012-06-28: 150 mg via INTRAVENOUS

## 2012-06-28 MED ORDER — MEPERIDINE HCL 25 MG/ML IJ SOLN: 6.2500 mg | INTRAMUSCULAR | Status: DC | PRN

## 2012-06-28 MED ORDER — CEFAZOLIN SODIUM-DEXTROSE 2-3 GM-% IV SOLR
INTRAVENOUS | Status: DC | PRN
  Administered 2012-06-28: 2 g via INTRAVENOUS

## 2012-06-28 MED ORDER — ACETAMINOPHEN 10 MG/ML IV SOLN
INTRAVENOUS | Status: AC
  Filled 2012-06-28: qty 100

## 2012-06-28 SURGICAL SUPPLY — 50 items
BANDAGE ELASTIC 6 VELCRO ST LF (GAUZE/BANDAGES/DRESSINGS) ×2 IMPLANT
BANDAGE ESMARK 6X9 LF (GAUZE/BANDAGES/DRESSINGS) IMPLANT
BLADE CUDA 5.5 (BLADE) IMPLANT
BLADE CUTTER GATOR 3.5 (BLADE) ×2 IMPLANT
BLADE GREAT WHITE 4.2 (BLADE) ×2 IMPLANT
BNDG CMPR 9X6 STRL LF SNTH (GAUZE/BANDAGES/DRESSINGS)
BNDG ESMARK 6X9 LF (GAUZE/BANDAGES/DRESSINGS)
BUR OVAL 6.0 (BURR) IMPLANT
CLOTH BEACON ORANGE TIMEOUT ST (SAFETY) ×2 IMPLANT
CUFF TOURNIQUET SINGLE 34IN LL (TOURNIQUET CUFF) ×2 IMPLANT
DRAPE ARTHROSCOPY W/POUCH 114 (DRAPES) ×2 IMPLANT
DRAPE U-SHAPE 47X51 STRL (DRAPES) ×2 IMPLANT
DRSG PAD ABDOMINAL 8X10 ST (GAUZE/BANDAGES/DRESSINGS) ×4 IMPLANT
ELECT CAUTERY BLADE 6.4 (BLADE) IMPLANT
ELECT MENISCUS 165MM 90D (ELECTRODE) IMPLANT
ELECT REM PT RETURN 9FT ADLT (ELECTROSURGICAL) ×2
ELECTRODE REM PT RTRN 9FT ADLT (ELECTROSURGICAL) ×1 IMPLANT
GAUZE XEROFORM 1X8 LF (GAUZE/BANDAGES/DRESSINGS) ×2 IMPLANT
GLOVE BIOGEL PI IND STRL 7.5 (GLOVE) IMPLANT
GLOVE BIOGEL PI IND STRL 8 (GLOVE) IMPLANT
GLOVE BIOGEL PI IND STRL 8.5 (GLOVE) ×3 IMPLANT
GLOVE BIOGEL PI INDICATOR 7.5 (GLOVE)
GLOVE BIOGEL PI INDICATOR 8 (GLOVE)
GLOVE BIOGEL PI INDICATOR 8.5 (GLOVE) ×3
GLOVE SURG ORTHO 7.0 STRL STRW (GLOVE) IMPLANT
GLOVE SURG ORTHO 8.0 STRL STRW (GLOVE) ×4 IMPLANT
GLOVE SURG SS PI 8.0 STRL IVOR (GLOVE) ×2 IMPLANT
GLOVE SURG SS PI 8.5 STRL IVOR (GLOVE) ×1
GLOVE SURG SS PI 8.5 STRL STRW (GLOVE) ×1 IMPLANT
GOWN BRE IMP SLV AUR XL STRL (GOWN DISPOSABLE) ×4 IMPLANT
GOWN STRL NON-REIN LRG LVL3 (GOWN DISPOSABLE) IMPLANT
GOWN STRL REIN 2XL XLG LVL4 (GOWN DISPOSABLE) ×4 IMPLANT
KIT ROOM TURNOVER OR (KITS) ×2 IMPLANT
MANIFOLD NEPTUNE II (INSTRUMENTS) ×2 IMPLANT
NEEDLE 18GX1X1/2 (RX/OR ONLY) (NEEDLE) IMPLANT
PACK ARTHROSCOPY DSU (CUSTOM PROCEDURE TRAY) ×2 IMPLANT
PAD ARMBOARD 7.5X6 YLW CONV (MISCELLANEOUS) ×2 IMPLANT
PENCIL BUTTON HOLSTER BLD 10FT (ELECTRODE) IMPLANT
SET ARTHROSCOPY TUBING (MISCELLANEOUS) ×2
SET ARTHROSCOPY TUBING LN (MISCELLANEOUS) ×1 IMPLANT
SPONGE GAUZE 4X4 12PLY (GAUZE/BANDAGES/DRESSINGS) ×2 IMPLANT
SPONGE LAP 4X18 X RAY DECT (DISPOSABLE) ×2 IMPLANT
SUT ETHILON 4 0 PS 2 18 (SUTURE) ×2 IMPLANT
SYR 30ML LL (SYRINGE) IMPLANT
SYR CONTROL 10ML LL (SYRINGE) ×2 IMPLANT
TOWEL OR 17X24 6PK STRL BLUE (TOWEL DISPOSABLE) ×2 IMPLANT
TOWEL OR 17X26 10 PK STRL BLUE (TOWEL DISPOSABLE) ×2 IMPLANT
TUBE CONNECTING 12X1/4 (SUCTIONS) IMPLANT
WAND 90 DEG TURBOVAC W/CORD (SURGICAL WAND) IMPLANT
WATER STERILE IRR 1000ML POUR (IV SOLUTION) ×2 IMPLANT

## 2012-06-28 NOTE — Anesthesia Postprocedure Evaluation (Signed)
Anesthesia Post Note  Patient: Roy [name]  Procedure(s) Performed: Procedure(s) (LRB): ARTHROSCOPY KNEE (Left)  Anesthesia type: general  Patient location: PACU  Post pain: Pain level controlled  Post assessment: Patient's Cardiovascular Status Stable  Last Vitals:  Filed Vitals:   06/28/12 1430  BP:   Pulse: 67  Temp: 36.6 C  Resp: 15    Post vital signs: Reviewed and stable  Level of consciousness: sedated  Complications: No apparent anesthesia complications

## 2012-06-28 NOTE — Transfer of Care (Signed)
Immediate Anesthesia Transfer of Care Note  Patient: Roy [name]  Procedure(s) Performed: Procedure(s) (LRB) with comments: ARTHROSCOPY KNEE (Left)  Patient Location: PACU  Anesthesia Type:General  Level of Consciousness: awake, alert  and oriented  Airway & Oxygen Therapy: Patient Spontanous Breathing and Patient connected to nasal cannula oxygen  Post-op Assessment: Report given to PACU RN  Post vital signs: Reviewed and stable  Complications: No apparent anesthesia complications

## 2012-06-28 NOTE — H&P (Signed)
  Roy [name] MRN:  [id number] DOB/SEX:  Jan 26, 1961/male  CHIEF COMPLAINT:  Painful left Knee  HISTORY: Patient is a 51 y.o. male presented with a history of pain in the left knee. Onset of symptoms was abrupt starting several months ago with gradually worsening course since that time. The patient noted no past surgery on the left knee. Prior procedures on the knee include none. Patient has been treated conservatively with over-the-counter NSAIDs and activity modification. Patient currently rates pain in the knee at 10 out of 10 with activity. There is pain at night.  PAST MEDICAL HISTORY: Patient Active Problem List   Diagnosis Date Noted  . HTN (hypertension) 01/08/2012  . Chest pain 01/08/2012  . CAD S/P percutaneous coronary angioplasty 01/08/2012  . Diabetes mellitus 01/08/2012  . Hyperlipidemia 01/08/2012   Past Medical History  Diagnosis Date  . 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 Diabetes mellitus     type II  . Hyperlipidemia   . Hypertension   . Melanoma   . Heart attack 12-2011    [facility]   Past Surgical History  Procedure Date  . Rotator cuff repair     RIGHT  . Knee arthoscopy     LEFT KNEE  . Ulnar tunnel release     RIGHT  . Keratotomies     BILATERAL  . Septoplasty   . Tonsillectomy   . Cardiac catheterization 12-22-11    with OM1 Promus stent 12/22/11     MEDICATIONS:   No prescriptions prior to admission    ALLERGIES:   Allergies  Allergen Reactions  . Contrast Media (Iodinated Diagnostic Agents) Other (See Comments)    Pt'shad streaks up and down his arms    REVIEW OF SYSTEMS:  Pertinent items are noted in HPI.   FAMILY HISTORY:   Family History  Problem Relation Age of Onset  . Coronary artery disease Mother   . Heart disease Father   . Heart attack Paternal Grandfather   . Lung cancer Mother   . Diabetes Paternal Grandmother   . Hyperlipidemia Brother   .  Hyperlipidemia      father's side  . Melanoma      fh    SOCIAL HISTORY:   History  Substance Use Topics  . Smoking status: Never Smoker   . Smokeless tobacco: Never Used  . Alcohol Use: No     EXAMINATION:  Vital signs in last 24 hours:    General appearance: alert, cooperative and no distress Lungs: clear to auscultation bilaterally Heart: regular rate and rhythm, S1, S2 normal, no murmur, click, rub or gallop Abdomen: soft, non-tender; bowel sounds normal; no masses,  no organomegaly Extremities: extremities normal, atraumatic, no cyanosis or edema and Homans sign is negative, no sign of DVT Pulses: 2+ and symmetric Skin: Skin color, texture, turgor normal. No rashes or lesions Neurologic: Alert and oriented X 3, normal strength and tone. Normal symmetric reflexes. 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 The overall alignment is neutral. The bone quality appears to be excellent for age and reported activity level.  MRI: medial mensical tear  Assessment/Plan: Mensical tear, left knee   Left knee scope  Sargent Mankey 06/28/2012, 7:26 AM

## 2012-06-28 NOTE — Anesthesia Preprocedure Evaluation (Signed)
Anesthesia Evaluation  Patient identified by MRN, date of birth, ID band Patient awake    Reviewed: Allergy & Precautions, H&P , NPO status , Patient's Chart, lab work & pertinent test results  Airway Mallampati: I TM Distance: >3 FB Neck ROM: Full    Dental   Pulmonary          Cardiovascular hypertension, Pt. on medications + CAD and + Past MI     Neuro/Psych    GI/Hepatic   Endo/Other  diabetes, Poorly Controlled, Type 2, Oral Hypoglycemic Agents  Renal/GU      Musculoskeletal   Abdominal   Peds  Hematology  (+) Blood dyscrasia, ,   Anesthesia Other Findings   Reproductive/Obstetrics                           Anesthesia Physical Anesthesia Plan  ASA: III  Anesthesia Plan: General   Post-op Pain Management:    Induction: Intravenous  Airway Management Planned: LMA  Additional Equipment:   Intra-op Plan:   Post-operative Plan: Extubation in OR  Informed Consent: I have reviewed the patients History and Physical, chart, labs and discussed the procedure including the risks, benefits and alternatives for the proposed anesthesia with the patient or authorized representative who has indicated his/her understanding and acceptance.     Plan Discussed with: CRNA and Surgeon  Anesthesia Plan Comments:         Anesthesia Quick Evaluation

## 2012-06-28 NOTE — Preoperative (Signed)
Beta Blockers   Reason not to administer Beta Blockers:B blocker taken 06/28/12 in am

## 2012-06-29 ENCOUNTER — Encounter (HOSPITAL_COMMUNITY): Payer: Self-pay | Admitting: Orthopedic Surgery

## 2012-06-29 LAB — GLUCOSE, CAPILLARY: Glucose-Capillary: 102 mg/dL — ABNORMAL HIGH (ref 70–99)

## 2012-06-29 NOTE — Op Note (Signed)
Dictation Number: [id number]

## 2012-06-30 ENCOUNTER — Telehealth: Payer: Self-pay | Admitting: Cardiovascular Disease

## 2012-06-30 ENCOUNTER — Inpatient Hospital Stay: Payer: Self-pay | Admitting: Internal Medicine

## 2012-06-30 LAB — CBC
HCT: 39.7 % — ABNORMAL LOW (ref 40.0–52.0)
HGB: 13.5 g/dL (ref 13.0–18.0)
MCH: 30.1 pg (ref 26.0–34.0)
MCHC: 33.9 g/dL (ref 32.0–36.0)
MCV: 89 fL (ref 80–100)
RDW: 13 % (ref 11.5–14.5)

## 2012-06-30 LAB — BASIC METABOLIC PANEL
Anion Gap: 9 (ref 7–16)
Calcium, Total: 8.9 mg/dL (ref 8.5–10.1)
Chloride: 107 mmol/L (ref 98–107)
Co2: 23 mmol/L (ref 21–32)
Glucose: 263 mg/dL — ABNORMAL HIGH (ref 65–99)
Osmolality: 286 (ref 275–301)
Sodium: 139 mmol/L (ref 136–145)

## 2012-06-30 LAB — TROPONIN I: Troponin-I: 4.97 ng/mL — ABNORMAL HIGH

## 2012-06-30 LAB — CK TOTAL AND CKMB (NOT AT ARMC): CK-MB: 15.2 ng/mL — ABNORMAL HIGH (ref 0.5–3.6)

## 2012-06-30 NOTE — Telephone Encounter (Signed)
Pt says he had knee replacement surg. 06/28/12 He had been holding plavix prior to surg He has developed left arm numbness and chest heaviness and says he is "very uncomfortable" He asks if he should come in or go to ER I advised, because of most recent surgery, holding plavix and hx of MI, to go to ER now Understanding verb He has appt with [place] 07/02/12 He will keep this appt to f/u

## 2012-06-30 NOTE — Op Note (Signed)
NAMEBELMONT, VALLI                ACCOUNT NO.:  [id number]  MEDICAL RECORD NO.:  [id number]  LOCATION:  [facility]                         FACILITY:  [facility]  PHYSICIAN:  Mila Homer. Sherlean Foot, M.D. DATE OF BIRTH:  April 11, 1961  DATE OF PROCEDURE: DATE OF DISCHARGE:  06/28/2012                              OPERATIVE REPORT   SURGEON:  Mila Homer. Sherlean Foot, M.D.  ASSISTANT:  __________.  ANESTHESIA:  General.  PREOPERATIVE DIAGNOSIS:  Left knee medial meniscus tear.  POSTOPERATIVE DIAGNOSIS:  Left knee medial meniscus tear.  PROCEDURE:  Left knee arthroscopy with partial medial meniscectomy.  INDICATION FOR PROCEDURE:  The patient is a 51 year old white male with mechanical symptoms and MRI evidence of meniscus tearing.  Informed consent was obtained.  DESCRIPTION OF PROCEDURE:  The patient was laid supine and administered general anesthesia.  The left leg was prepped and draped in usual sterile fashion.  Inferolateral and inferomedial portals were created with #11 blade, blunt trocar and cannula.  Diagnostic arthroscopy revealed minimal chondromalacia in the __________ chondromalacia __________ carried out with straight basket forceps and great White shaver used for partial medial meniscectomy.  The ACL and PCL were normal.  __________ Cassandria Santee with Xeroform, dressing sponges, sterile Webril, and Ace wrap.  COMPLICATIONS:  None.  DRAINS:  None.          ______________________________ Mila Homer. Sherlean Foot, M.D.     SDL/MEDQ  D:  06/29/2012  T:  06/30/2012  Job:  [id number]

## 2012-06-30 NOTE — Telephone Encounter (Signed)
Patient has a question for the RN before his appointment on Friday to see Dr. Mariah Milling.

## 2012-06-30 NOTE — Telephone Encounter (Signed)
Pt called back stating that he is having a lot of CP and wants to know if the medication is causing it.

## 2012-07-01 DIAGNOSIS — I214 Non-ST elevation (NSTEMI) myocardial infarction: Secondary | ICD-10-CM

## 2012-07-01 DIAGNOSIS — I2510 Atherosclerotic heart disease of native coronary artery without angina pectoris: Secondary | ICD-10-CM

## 2012-07-01 DIAGNOSIS — I059 Rheumatic mitral valve disease, unspecified: Secondary | ICD-10-CM

## 2012-07-01 LAB — CK TOTAL AND CKMB (NOT AT ARMC)
CK, Total: 353 U/L — ABNORMAL HIGH (ref 35–232)
CK, Total: 374 U/L — ABNORMAL HIGH (ref 35–232)
CK, Total: 430 U/L — ABNORMAL HIGH (ref 35–232)
CK-MB: 13.1 ng/mL — ABNORMAL HIGH (ref 0.5–3.6)
CK-MB: 12.5 ng/mL — ABNORMAL HIGH (ref 0.5–3.6)

## 2012-07-01 LAB — CBC WITH DIFFERENTIAL/PLATELET
Basophil #: 0 10*3/uL (ref 0.0–0.1)
Eosinophil #: 0.1 10*3/uL (ref 0.0–0.7)
Eosinophil %: 1.7 %
HCT: 34.3 % — ABNORMAL LOW (ref 40.0–52.0)
HGB: 11.8 g/dL — ABNORMAL LOW (ref 13.0–18.0)
Lymphocyte %: 25.6 %
MCH: 30.1 pg (ref 26.0–34.0)
MCHC: 34.5 g/dL (ref 32.0–36.0)
Monocyte %: 13.1 %
Neutrophil #: 3.8 10*3/uL (ref 1.4–6.5)
Platelet: 120 10*3/uL — ABNORMAL LOW (ref 150–440)

## 2012-07-01 LAB — APTT
Activated PTT: 121.1 secs — ABNORMAL HIGH (ref 23.6–35.9)
Activated PTT: 67.2 secs — ABNORMAL HIGH (ref 23.6–35.9)

## 2012-07-01 LAB — LIPID PANEL
Cholesterol: 105 mg/dL (ref 0–200)
HDL Cholesterol: 35 mg/dL — ABNORMAL LOW (ref 40–60)
Ldl Cholesterol, Calc: 61 mg/dL (ref 0–100)
Triglycerides: 47 mg/dL (ref 0–200)
VLDL Cholesterol, Calc: 9 mg/dL (ref 5–40)

## 2012-07-01 LAB — TROPONIN I
Troponin-I: 14 ng/mL — ABNORMAL HIGH
Troponin-I: 9.6 ng/mL — ABNORMAL HIGH
Troponin-I: 17.72 ng/mL — ABNORMAL HIGH

## 2012-07-01 LAB — BASIC METABOLIC PANEL
BUN: 11 mg/dL (ref 7–18)
Creatinine: 0.96 mg/dL (ref 0.60–1.30)
EGFR (African American): >60
EGFR (Non-African Amer.): >60
Glucose: 151 mg/dL — ABNORMAL HIGH (ref 65–99)
Potassium: 3.9 mmol/L (ref 3.5–5.1)
Sodium: 140 mmol/L (ref 136–145)

## 2012-07-01 LAB — HEMOGLOBIN A1C: Hemoglobin A1C: 8.2 % — ABNORMAL HIGH (ref 4.2–6.3)

## 2012-07-02 ENCOUNTER — Ambulatory Visit: Payer: BC Managed Care – PPO | Admitting: Cardiovascular Disease

## 2012-07-02 ENCOUNTER — Encounter: Payer: Self-pay | Admitting: Cardiovascular Disease

## 2012-07-02 ENCOUNTER — Other Ambulatory Visit: Payer: Self-pay | Admitting: Cardiovascular Disease

## 2012-07-02 DIAGNOSIS — I214 Non-ST elevation (NSTEMI) myocardial infarction: Secondary | ICD-10-CM

## 2012-07-02 LAB — CK TOTAL AND CKMB (NOT AT ARMC)
CK, Total: 460 U/L — ABNORMAL HIGH (ref 35–232)
CK-MB: 12.1 ng/mL — ABNORMAL HIGH (ref 0.5–3.6)
CK-MB: 14.1 ng/mL — ABNORMAL HIGH (ref 0.5–3.6)

## 2012-07-02 LAB — CBC WITH DIFFERENTIAL/PLATELET
Basophil %: 0.1 %
Eosinophil #: 0 10*3/uL (ref 0.0–0.7)
Eosinophil %: 0 %
HCT: 34.8 % — ABNORMAL LOW (ref 40.0–52.0)
Lymphocyte #: 0.6 10*3/uL — ABNORMAL LOW (ref 1.0–3.6)
Lymphocyte %: 6.1 %
MCH: 30.8 pg (ref 26.0–34.0)
MCHC: 35.3 g/dL (ref 32.0–36.0)
Monocyte #: 0.8 x10 3/mm (ref 0.2–1.0)
Monocyte %: 8.1 %
Neutrophil #: 8.6 10*3/uL — ABNORMAL HIGH (ref 1.4–6.5)
Neutrophil %: 85.7 %
Platelet: 131 10*3/uL — ABNORMAL LOW (ref 150–440)
RDW: 12.9 % (ref 11.5–14.5)
WBC: 10.1 10*3/uL (ref 3.8–10.6)

## 2012-07-02 LAB — BASIC METABOLIC PANEL
BUN: 30 mg/dL — ABNORMAL HIGH (ref 7–18)
Chloride: 103 mmol/L (ref 98–107)
Creatinine: 1.46 mg/dL — ABNORMAL HIGH (ref 0.60–1.30)
EGFR (Non-African Amer.): 55 — ABNORMAL LOW
Glucose: 378 mg/dL — ABNORMAL HIGH (ref 65–99)
Osmolality: 294 (ref 275–301)
Potassium: 4.3 mmol/L (ref 3.5–5.1)
Sodium: 136 mmol/L (ref 136–145)

## 2012-07-02 LAB — TROPONIN I: Troponin-I: 12 ng/mL — ABNORMAL HIGH

## 2012-07-03 LAB — BASIC METABOLIC PANEL
Anion Gap: 8 (ref 7–16)
Calcium, Total: 8.5 mg/dL (ref 8.5–10.1)
Co2: 25 mmol/L (ref 21–32)
Creatinine: 1.3 mg/dL (ref 0.60–1.30)
EGFR (African American): >60
Potassium: 4.3 mmol/L (ref 3.5–5.1)
Sodium: 140 mmol/L (ref 136–145)

## 2012-07-05 ENCOUNTER — Other Ambulatory Visit: Payer: Self-pay

## 2012-07-05 ENCOUNTER — Telehealth: Payer: Self-pay

## 2012-07-05 MED ORDER — ALBUTEROL SULFATE HFA 108 (90 BASE) MCG/ACT IN AERS: 2.0000 | INHALATION_SPRAY | RESPIRATORY_TRACT | Status: DC | PRN

## 2012-07-05 NOTE — Telephone Encounter (Signed)
Discussed with Dr. Mariah Milling, who suggests pt continue taking lasix BID as prescribed. He als gave orders for "albuterol 1-2 puffs Q4 hours PRN sob" VO Dr. Alvis Lemmings, RN Dr. Mariah Milling suggest we try prednisone if albuterol does not help symptoms.  We will have pt call back in a few days to report.

## 2012-07-05 NOTE — Telephone Encounter (Signed)
Patient needs a letter stating he should remain out of work until his follow up with Dr. Mariah Milling on Dec. 26, 2013. The patient has started on the effient and is taking an aspirin 81 mg with having nosebleeds and difficulty breathing. Please contact patient and advise what to do.

## 2012-07-05 NOTE — Telephone Encounter (Signed)
TCM call Pt says he was d/c from hospital Saturday night s/p NSTEMI and DES to circumflex Denies chest pressure or left arm discomfort since d/c Main complaint is orthopnea Says the only way he can rest is to "sit up" Also c/o nosebleeds since starting ASA and Effient Denies edema Confirms compliance with lasix. Says he took this yesterday I told him I would discuss with Dr. Mariah Milling and call him back Understanding verb Has appt with [place] next week 07/15/12 I will place work note at [facility] for him to pick up at his convenience

## 2012-07-05 NOTE — Telephone Encounter (Signed)
Pt informed Understanding verb I will call him back in 2 days to reassess New RX sent to pharm

## 2012-07-08 LAB — CULTURE, BLOOD (SINGLE)

## 2012-07-15 ENCOUNTER — Telehealth: Payer: Self-pay

## 2012-07-15 ENCOUNTER — Ambulatory Visit (INDEPENDENT_AMBULATORY_CARE_PROVIDER_SITE_OTHER): Payer: BC Managed Care – PPO | Admitting: Cardiovascular Disease

## 2012-07-15 ENCOUNTER — Encounter: Payer: Self-pay | Admitting: Cardiovascular Disease

## 2012-07-15 VITALS — BP 108/58 | HR 83 | Ht 69.0 in | Wt 200.0 lb

## 2012-07-15 DIAGNOSIS — Z9861 Coronary angioplasty status: Secondary | ICD-10-CM

## 2012-07-15 DIAGNOSIS — I2510 Atherosclerotic heart disease of native coronary artery without angina pectoris: Secondary | ICD-10-CM

## 2012-07-15 DIAGNOSIS — R05 Cough: Secondary | ICD-10-CM | POA: Insufficient documentation

## 2012-07-15 DIAGNOSIS — E119 Type 2 diabetes mellitus without complications: Secondary | ICD-10-CM

## 2012-07-15 DIAGNOSIS — I10 Essential (primary) hypertension: Secondary | ICD-10-CM

## 2012-07-15 DIAGNOSIS — E785 Hyperlipidemia, unspecified: Secondary | ICD-10-CM

## 2012-07-15 MED ORDER — LEVOFLOXACIN 500 MG PO TABS: 500.0000 mg | ORAL_TABLET | Freq: Every day | ORAL | Status: DC

## 2012-07-15 MED ORDER — ROSUVASTATIN CALCIUM 10 MG PO TABS: 10.0000 mg | ORAL_TABLET | Freq: Every day | ORAL | Status: DC

## 2012-07-15 MED ORDER — ROSUVASTATIN CALCIUM 20 MG PO TABS: 20.0000 mg | ORAL_TABLET | Freq: Every day | ORAL | Status: DC

## 2012-07-15 MED ORDER — METOPROLOL SUCCINATE ER 25 MG PO TB24: 50.0000 mg | ORAL_TABLET | Freq: Every day | ORAL | Status: DC

## 2012-07-15 MED ORDER — METOPROLOL SUCCINATE ER 50 MG PO TB24: 50.0000 mg | ORAL_TABLET | Freq: Three times a day (TID) | ORAL | Status: DC

## 2012-07-15 MED ORDER — FUROSEMIDE 20 MG PO TABS: 20.0000 mg | ORAL_TABLET | Freq: Two times a day (BID) | ORAL | Status: DC

## 2012-07-15 MED ORDER — PRASUGREL HCL 10 MG PO TABS: 10.0000 mg | ORAL_TABLET | Freq: Every day | ORAL | Status: DC

## 2012-07-15 NOTE — Progress Notes (Signed)
Patient ID: Roy [name], male    DOB: [birth year], 51 y.o.   MRN: [id number]  HPI Comments: 51 year old gentleman with history of coronary artery disease, stent in the past to the OM vessel in June 2013, also history of hypertension, diabetes, admitted for non-STEMI December 14 with chest pain.  He had a significant decline in his cardiac enzymes with peak troponin of 14 bare-metal stent x2 placed to his mid circumflex which was occluded with large thrombus. He had thrombectomy and placement of the stents. He was started on effient, Plavix was held, aspirin was continued. During the case, he had profound shortness of breath and coughing. He was treated with aggressive diuretics for suspected flash pulmonary edema. He had mild improvement of his symptoms though this took 2 days' time, requiring nebulizer treatments and supplemental oxygen. Chest x-ray concerning for possible infiltrate and he was treated with a Z-Pak.   He presents today and reports having continued cough particularly when he lies flat at nighttime. He has not been taking Lasix daily. He has had significant by mouth fluid intake over the past 2 weeks. He denies any edema, abdominal swelling, no productive sputum. He is relatively asymptomatic when upright, very symptomatic when flat. Last night he reports he is unable to sleep very well. He took several Lasix pills, had frequent urination for several hours and then felt better and was able to sleep. His wife did notice that he was clammy and sweaty last night.  Of note, aggressive diuresis in the hospital following his stent placement did not provide rapid improvement of his respiratory status.  Most recent cholesterol September 2013 shows total cholesterol 151, LDL 93  EKG shows normal sinus rhythm with rate 83 beats a minute, T wave abnormality in lead 3, 2, aVF   Outpatient Encounter Prescriptions as of 07/15/2012  Medication Sig Dispense Refill  . acetaminophen (TYLENOL) 500 MG  tablet Take 500 mg by mouth every 6 (six) hours as needed. For pain      . albuterol (PROVENTIL HFA;VENTOLIN HFA) 108 (90 BASE) MCG/ACT inhaler Inhale 2 puffs into the lungs every 4 (four) hours as needed for wheezing.  1 Inhaler  3  . aspirin 81 MG tablet Take 81 mg by mouth daily.      . clopidogrel (PLAVIX) 75 MG tablet Take 1 tablet (75 mg total) by mouth daily.  90 tablet  3  . CRESTOR 20 MG tablet Take 20 mg by mouth daily.       . furosemide (LASIX) 20 MG tablet Take 20 mg by mouth 2 (two) times daily.      . metoprolol succinate (TOPROL-XL) 25 MG 24 hr tablet Take 1 tablet by mouth 3 (three) times daily.       Marland Kitchen NITROSTAT 0.4 MG SL tablet Place 0.4 mg under the tongue every 5 (five) minutes as needed.       . pioglitazone (ACTOS) 15 MG tablet Take 15 mg by mouth daily.      . sitaGLIPtan-metformin (JANUMET) 50-1000 MG per tablet Take 1 tablet by mouth daily.       . [DISCONTINUED] aspirin 325 MG tablet Take 325 mg by mouth daily.      . [DISCONTINUED] benazepril (LOTENSIN) 10 MG tablet Take 5 mg by mouth daily.       . [DISCONTINUED] HYDROcodone-acetaminophen (NORCO) 5-325 MG per tablet Take 1-2 tablets by mouth every 4 (four) hours as needed for pain.  60 tablet  0  . [DISCONTINUED] tetrahydrozoline-zinc (  VISINE-AC) 0.05-0.25 % ophthalmic solution Place 2 drops into both eyes 3 (three) times daily as needed.         Review of Systems  Constitutional: Negative.   HENT: Negative.   Eyes: Negative.   Respiratory: Positive for cough and shortness of breath.   Cardiovascular: Negative.   Gastrointestinal: Negative.   Musculoskeletal: Negative.   Skin: Negative.   Neurological: Negative.   Hematological: Negative.   Psychiatric/Behavioral: Negative.   All other systems reviewed and are negative.    BP 108/58  Pulse 83  Ht 5\' 9"  (1.753 m)  Wt 200 lb (90.719 kg)  BMI 29.53 kg/m2  Physical Exam  Nursing note and vitals reviewed. Constitutional: He is oriented to person,  place, and time. He appears well-developed and well-nourished.  HENT:  Head: Normocephalic.  Nose: Nose normal.  Mouth/Throat: Oropharynx is clear and moist.  Eyes: Conjunctivae normal are normal. Pupils are equal, round, and reactive to light.  Neck: Normal range of motion. Neck supple. No JVD present.  Cardiovascular: Normal rate, regular rhythm, S1 normal, S2 normal, normal heart sounds and intact distal pulses.  Exam reveals no gallop and no friction rub.   No murmur heard. Pulmonary/Chest: Effort normal and breath sounds normal. No respiratory distress. He has no wheezes. He has no rales. He exhibits no tenderness.  Abdominal: Soft. Bowel sounds are normal. He exhibits no distension. There is no tenderness.  Musculoskeletal: Normal range of motion. He exhibits no edema and no tenderness.  Lymphadenopathy:    He has no cervical adenopathy.  Neurological: He is alert and oriented to person, place, and time. Coordination normal.  Skin: Skin is warm and dry. No rash noted. No erythema.  Psychiatric: He has a normal mood and affect. His behavior is normal. Judgment and thought content normal.           Assessment and Plan

## 2012-07-15 NOTE — Assessment & Plan Note (Addendum)
ACE inhibitor held previously for cough. Continued cough since discharge. Minimal improvement on diuretics in the hospital, better improvement on nebulizer treatments. Symptoms concerning for infection the symptoms were severe last night. He has completed Z-Pak already after leaving the hospital. Possible improvement after Lasix last night. We have suggested he continue on Lasix 20 mg daily with extra Lasix in the afternoon for any coughing or shortness of breath. If symptoms get worse, we have suggested he start Levaquin. If no improvement, he may need referral to pulmonary. He does have an inhaler and has minimal improvement on albuterol. Uncertain if he needs a steroid inhaler. We have suggested he try to decrease his fluid intake. He has been drinking significant water and iced tea.

## 2012-07-15 NOTE — Patient Instructions (Addendum)
Please take lasix daily, extra lasix in the afternoon for cough/shortness of breath If cough persists, start antibiotic for 14 days  Decrease metoprolol to 25 mg x 2 daily  Please call us if you have new issues that need to be addressed before your next appt.  Your physician wants you to follow-up in: 1 week with Dr. Kirke Corin

## 2012-07-15 NOTE — Assessment & Plan Note (Signed)
Blood pressure is well controlled on today's visit. He was given a prescription for metoprolol succinate and told to take this 3 times a day. We have suggested he take metoprolol succinate 50 mg daily with close monitoring of his heart rate.

## 2012-07-15 NOTE — Assessment & Plan Note (Signed)
Recent stent placement to the mid left circumflex, prior stent placement to the OM vessel. Statin aspirin and effient. Aggressive cholesterol management.

## 2012-07-15 NOTE — Assessment & Plan Note (Signed)
Stay on Crestor 20 mg daily. Prior lab work from September 2013 showing high LDL. Goal LDL is less than 70.

## 2012-07-15 NOTE — Telephone Encounter (Signed)
Pt's wife called to see if we could see pt any sooner than 2:45 this afternoon. Says since d/c pt has had dyspnea and coughing "a lot". Says he is unable to sleep at night d/t cough and sob. Says they thought this would "get better" so they didn't call. Has been going on x 1 week +.  Denies chest pain. She is unsure of pt's meds he is taking, unsure if he is taking a diuretic or not ( this is unclear by d/c instructions in epic). I tried paging Dr. Mariah Milling but he is scrubbed in for cath at [facility]. I explained to wife i am unable to reach MD to get permission to work pt in this am. She verb understanding and states, "we have waited this long. 2:30 today is ok". I advised her to have pt go to ER should symptoms get worse/change before we see him today. Understanding verb. She asks that I call pt at home to confirm meds he is taking.  I was able to speak with pt. He is in no acute distress. He explained he was prescribed lasix 20 mg BID at d/c. He admits he has not been taking this daily since it makes him urinate "alot". He tells me he DID take 1 lasix tablet last night and got "some relief" from coughing and dyspnea. He has not taken any this am. I advised ok to take this 1 tablet this am until we can see him this afternoon. Understanding verb. He will go to ER should he begin to feel worse/etc.

## 2012-07-15 NOTE — Assessment & Plan Note (Signed)
He does report sugars have been elevated recently. We have encouraged continued exercise, careful diet management in an effort to lose weight.

## 2012-07-26 ENCOUNTER — Ambulatory Visit (INDEPENDENT_AMBULATORY_CARE_PROVIDER_SITE_OTHER): Payer: BC Managed Care – PPO | Admitting: Cardiovascular Disease

## 2012-07-26 ENCOUNTER — Encounter: Payer: Self-pay | Admitting: Cardiovascular Disease

## 2012-07-26 VITALS — BP 108/58 | HR 87 | Ht 69.0 in | Wt 201.5 lb

## 2012-07-26 DIAGNOSIS — I2510 Atherosclerotic heart disease of native coronary artery without angina pectoris: Secondary | ICD-10-CM

## 2012-07-26 DIAGNOSIS — R011 Cardiac murmur, unspecified: Secondary | ICD-10-CM

## 2012-07-26 DIAGNOSIS — R079 Chest pain, unspecified: Secondary | ICD-10-CM

## 2012-07-26 DIAGNOSIS — R0609 Other forms of dyspnea: Secondary | ICD-10-CM

## 2012-07-26 DIAGNOSIS — R0600 Dyspnea, unspecified: Secondary | ICD-10-CM

## 2012-07-26 DIAGNOSIS — E785 Hyperlipidemia, unspecified: Secondary | ICD-10-CM

## 2012-07-26 DIAGNOSIS — Z9861 Coronary angioplasty status: Secondary | ICD-10-CM

## 2012-07-26 DIAGNOSIS — I10 Essential (primary) hypertension: Secondary | ICD-10-CM

## 2012-07-26 DIAGNOSIS — R000 Tachycardia, unspecified: Secondary | ICD-10-CM

## 2012-07-26 DIAGNOSIS — R0602 Shortness of breath: Secondary | ICD-10-CM

## 2012-07-26 NOTE — Progress Notes (Signed)
HPI  This is 52 year old gentleman who is a patient of Dr. Mariah Milling who requested a followup and evaluation with me regarding continued symptoms of heart failure. The patient has a history of coronary artery disease, stent in the past to the OM vessel in June 2013, also history of hypertension, diabetes, admitted for non-STEMI December 14 , 2013 He had a significant increase in his cardiac enzymes with peak troponin of 14 bare-metal stent x2 placed to his mid circumflex which was occluded with large thrombus. He had thrombectomy and placement of the stents. He was started on effient, Plavix was held, aspirin was continued. He went into flash pulmonary edema after diagnostic cath and before the PCI. He was treated with aggressive diuretics for suspected flash pulmonary edema. He had mild improvement of his symptoms though this took 2 days' time, requiring nebulizer treatments and supplemental oxygen. Chest x-ray concerning for possible infiltrate and he was treated with a Z-Pak.  Since hospital discharge, he reports gradual improvement in his symptoms. However, he continues to use a diuretic. He continues to have poor sleep with significant orthopnea and PND. He denies any chest pain but does not feel right.     Allergies  Allergen Reactions  . Contrast Media (Iodinated Diagnostic Agents) Other (See Comments)    Pt'shad streaks up and down his arms     Current Outpatient Prescriptions on File Prior to Visit  Medication Sig Dispense Refill  . acetaminophen (TYLENOL) 500 MG tablet Take 500 mg by mouth every 6 (six) hours as needed. For pain      . albuterol (PROVENTIL HFA;VENTOLIN HFA) 108 (90 BASE) MCG/ACT inhaler Inhale 2 puffs into the lungs every 4 (four) hours as needed for wheezing.  1 Inhaler  3  . aspirin 81 MG tablet Take 81 mg by mouth daily.      . furosemide (LASIX) 20 MG tablet Take 1 tablet (20 mg total) by mouth 2 (two) times daily.  60 tablet  11  . levofloxacin (LEVAQUIN) 500 MG  tablet Take 1 tablet (500 mg total) by mouth daily.  14 tablet  0  . metoprolol succinate (TOPROL XL) 25 MG 24 hr tablet Take 2 tablets (50 mg total) by mouth daily.  60 tablet  3  . NITROSTAT 0.4 MG SL tablet Place 0.4 mg under the tongue every 5 (five) minutes as needed.       . pioglitazone (ACTOS) 15 MG tablet Take 15 mg by mouth daily.      . prasugrel (EFFIENT) 10 MG TABS Take 1 tablet (10 mg total) by mouth daily.  90 tablet  3  . rosuvastatin (CRESTOR) 20 MG tablet Take 1 tablet (20 mg total) by mouth daily.  90 tablet  3  . sitaGLIPtan-metformin (JANUMET) 50-1000 MG per tablet Take 1 tablet by mouth daily.          Past Medical History  Diagnosis Date  . 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 Diabetes mellitus     type II  . Hyperlipidemia   . Hypertension   . Melanoma   . Heart attack 12-2011    [facility]     Past Surgical History  Procedure Date  . Rotator cuff repair     RIGHT  . Knee arthoscopy     LEFT KNEE  . Ulnar tunnel release     RIGHT  . Keratotomies     BILATERAL  . Septoplasty   .  Tonsillectomy   . Knee arthroscopy 06/28/2012    Procedure: ARTHROSCOPY KNEE;  Surgeon: Dannielle Huh, MD;  Location: [facility];  Service: Orthopedics;  Laterality: Left;  . Cardiac catheterization 12-22-11    with OM1 Promus stent 12/22/11  . Knee surgery     left     Family History  Problem Relation Age of Onset  . Coronary artery disease Mother   . Heart disease Father   . Heart attack Paternal Grandfather   . Lung cancer Mother   . Diabetes Paternal Grandmother   . Hyperlipidemia Brother   . Hyperlipidemia      father's side  . Melanoma      fh     History   Social History  . Marital Status: Married    Spouse Name: N/A    Number of Children: N/A  . Years of Education: N/A   Occupational History  . Not on file.   Social History Main Topics  . Smoking status: Never Smoker   . Smokeless tobacco:  Never Used  . Alcohol Use: No  . Drug Use: No  . Sexually Active:    Other Topics Concern  . Not on file   Social History Narrative  . No narrative on file      PHYSICAL EXAM   BP 108/58  Pulse 87  Ht 5\' 9"  (1.753 m)  Wt 201 lb 8 oz (91.4 kg)  BMI 29.76 kg/m2 Constitutional: He is oriented to person, place, and time. He appears well-developed and well-nourished. No distress.  HENT: No nasal discharge.  Head: Normocephalic and atraumatic.  Eyes: Pupils are equal and round. Right eye exhibits no discharge. Left eye exhibits no discharge.  Neck: Normal range of motion. Neck supple. No JVD present. No thyromegaly present.  Cardiovascular: Normal rate, regular rhythm, normal heart sounds and. Exam reveals no gallop and no friction rub. There is a 2/6 holosystolic murmur at the apex radiating to the left sternal border. Pulmonary/Chest: Effort normal and breath sounds normal. No stridor. No respiratory distress. He has no wheezes. He has no rales. He exhibits no tenderness.  Abdominal: Soft. Bowel sounds are normal. He exhibits no distension. There is no tenderness. There is no rebound and no guarding.  Musculoskeletal: Normal range of motion. He exhibits no edema and no tenderness.  Neurological: He is alert and oriented to person, place, and time. Coordination normal.  Skin: Skin is warm and dry. No rash noted. He is not diaphoretic. No erythema. No pallor.  Psychiatric: He has a normal mood and affect. His behavior is normal. Judgment and thought content normal.       EKG: Sinus  Rhythm  -Inferior infarct -age undetermined.   ABNORMAL    ASSESSMENT AND PLAN

## 2012-07-26 NOTE — Patient Instructions (Addendum)
Stay off work until further notice.   Your physician has requested that you have an echocardiogram. Echocardiography is a painless test that uses sound waves to create images of your heart. It provides your doctor with information about the size and shape of your heart and how well your heart's chambers and valves are working. This procedure takes approximately one hour. There are no restrictions for this procedure.  Follow up after echo.

## 2012-07-26 NOTE — Assessment & Plan Note (Signed)
Blood pressure is well controlled 

## 2012-07-26 NOTE — Assessment & Plan Note (Signed)
Continue treatment with Crestor. 

## 2012-07-26 NOTE — Assessment & Plan Note (Signed)
He has no clear symptoms of angina but his symptoms of heart failure are concerning. He had a bare-metal stent placed in December and thus Effient can be held if his mitral valve surgery is needed.

## 2012-07-26 NOTE — Assessment & Plan Note (Addendum)
The patient continues to have significant dyspnea, orthopnea and PND. He started having symptoms of heart failure in spite of revascularization. On today's physical exam, I hear a mitral regurgitation murmur which I did not appreciate before. I am highly concerned that he is having ischemic mitral regurgitation especially that his left circumflex was actually occluded. The possibility his LV systolic dysfunction related to embolization during PCI. However, I think that is less likely given that he decompensated shortly after the diagnostic cardiac catheterization and before PCI was started. Due to that, I will obtain an echocardiogram this week. I want him to stay off work until we get the echocardiogram and decide if further intervention is needed.

## 2012-07-27 ENCOUNTER — Other Ambulatory Visit (HOSPITAL_COMMUNITY): Payer: Self-pay | Admitting: Cardiovascular Disease

## 2012-07-27 DIAGNOSIS — R0989 Other specified symptoms and signs involving the circulatory and respiratory systems: Secondary | ICD-10-CM

## 2012-07-28 ENCOUNTER — Other Ambulatory Visit: Payer: Self-pay | Admitting: *Deleted

## 2012-07-28 ENCOUNTER — Encounter (HOSPITAL_COMMUNITY): Payer: Self-pay | Admitting: *Deleted

## 2012-07-28 ENCOUNTER — Encounter (HOSPITAL_COMMUNITY)
Admission: AD | Disposition: A | Discharge status: Home / Self Care | Payer: Self-pay | Source: Ambulatory Visit | Attending: Cardiology

## 2012-07-28 ENCOUNTER — Ambulatory Visit (HOSPITAL_BASED_OUTPATIENT_CLINIC_OR_DEPARTMENT_OTHER): Payer: BC Managed Care – PPO

## 2012-07-28 ENCOUNTER — Inpatient Hospital Stay (HOSPITAL_COMMUNITY)
Admission: AD | Admit: 2012-07-28 | Discharge: 2012-08-07 | DRG: 545 | Disposition: A | Discharge status: Home / Self Care | Payer: BC Managed Care – PPO | Source: Ambulatory Visit | Attending: Thoracic Surgery (Cardiothoracic Vascular Surgery) | Admitting: Thoracic Surgery (Cardiothoracic Vascular Surgery)

## 2012-07-28 ENCOUNTER — Observation Stay (HOSPITAL_COMMUNITY): Payer: BC Managed Care – PPO

## 2012-07-28 DIAGNOSIS — I10 Essential (primary) hypertension: Secondary | ICD-10-CM

## 2012-07-28 DIAGNOSIS — R0989 Other specified symptoms and signs involving the circulatory and respiratory systems: Secondary | ICD-10-CM

## 2012-07-28 DIAGNOSIS — I2542 Coronary artery dissection: Secondary | ICD-10-CM | POA: Diagnosis present

## 2012-07-28 DIAGNOSIS — Z9861 Coronary angioplasty status: Secondary | ICD-10-CM

## 2012-07-28 DIAGNOSIS — D696 Thrombocytopenia, unspecified: Secondary | ICD-10-CM | POA: Diagnosis not present

## 2012-07-28 DIAGNOSIS — D62 Acute posthemorrhagic anemia: Secondary | ICD-10-CM | POA: Diagnosis not present

## 2012-07-28 DIAGNOSIS — I2510 Atherosclerotic heart disease of native coronary artery without angina pectoris: Secondary | ICD-10-CM

## 2012-07-28 DIAGNOSIS — E119 Type 2 diabetes mellitus without complications: Secondary | ICD-10-CM

## 2012-07-28 DIAGNOSIS — R079 Chest pain, unspecified: Secondary | ICD-10-CM

## 2012-07-28 DIAGNOSIS — I359 Nonrheumatic aortic valve disorder, unspecified: Secondary | ICD-10-CM

## 2012-07-28 DIAGNOSIS — Z91041 Radiographic dye allergy status: Secondary | ICD-10-CM

## 2012-07-28 DIAGNOSIS — Z951 Presence of aortocoronary bypass graft: Secondary | ICD-10-CM

## 2012-07-28 DIAGNOSIS — Z87898 Personal history of other specified conditions: Secondary | ICD-10-CM

## 2012-07-28 DIAGNOSIS — E785 Hyperlipidemia, unspecified: Secondary | ICD-10-CM

## 2012-07-28 DIAGNOSIS — R0600 Dyspnea, unspecified: Secondary | ICD-10-CM

## 2012-07-28 DIAGNOSIS — Z85820 Personal history of malignant melanoma of skin: Secondary | ICD-10-CM

## 2012-07-28 DIAGNOSIS — Z79899 Other long term (current) drug therapy: Secondary | ICD-10-CM

## 2012-07-28 DIAGNOSIS — I38 Endocarditis, valve unspecified: Secondary | ICD-10-CM

## 2012-07-28 DIAGNOSIS — Z952 Presence of prosthetic heart valve: Secondary | ICD-10-CM

## 2012-07-28 DIAGNOSIS — I351 Nonrheumatic aortic (valve) insufficiency: Secondary | ICD-10-CM

## 2012-07-28 DIAGNOSIS — Z01810 Encounter for preprocedural cardiovascular examination: Secondary | ICD-10-CM

## 2012-07-28 DIAGNOSIS — I509 Heart failure, unspecified: Secondary | ICD-10-CM | POA: Diagnosis present

## 2012-07-28 HISTORY — PX: TEE WITHOUT CARDIOVERSION: SHX5443

## 2012-07-28 HISTORY — DX: Shortness of breath: R06.02

## 2012-07-28 HISTORY — DX: Nonrheumatic aortic (valve) insufficiency: I35.1

## 2012-07-28 LAB — CBC WITH DIFFERENTIAL/PLATELET
Basophils Absolute: 0 10*3/uL (ref 0.0–0.1)
Basophils Relative: 0 % (ref 0–1)
Eosinophils Absolute: 0.1 10*3/uL (ref 0.0–0.7)
Eosinophils Relative: 2 % (ref 0–5)
HCT: 36.1 % — ABNORMAL LOW (ref 39.0–52.0)
Lymphocytes Relative: 35 % (ref 12–46)
MCH: 29.6 pg (ref 26.0–34.0)
MCHC: 34.1 g/dL (ref 30.0–36.0)
MCV: 87 fL (ref 78.0–100.0)
Monocytes Absolute: 0.4 10*3/uL (ref 0.1–1.0)
RDW: 13.8 % (ref 11.5–15.5)

## 2012-07-28 LAB — COMPREHENSIVE METABOLIC PANEL
AST: 17 U/L (ref 0–37)
CO2: 26 mEq/L (ref 19–32)
Calcium: 9.4 mg/dL (ref 8.4–10.5)
Creatinine, Ser: 1.1 mg/dL (ref 0.50–1.35)
GFR calc non Af Amer: 77 mL/min — ABNORMAL LOW (ref >90)

## 2012-07-28 LAB — GLUCOSE, CAPILLARY
Glucose-Capillary: 182 mg/dL — ABNORMAL HIGH (ref 70–99)
Glucose-Capillary: 111 mg/dL — ABNORMAL HIGH (ref 70–99)

## 2012-07-28 SURGERY — ECHOCARDIOGRAM, TRANSESOPHAGEAL
Anesthesia: Moderate Sedation

## 2012-07-28 MED ORDER — SITAGLIPTIN PHOS-METFORMIN HCL 50-1000 MG PO TABS: 1.0000 | ORAL_TABLET | Freq: Every day | ORAL | Status: DC

## 2012-07-28 MED ORDER — ACETAMINOPHEN 325 MG PO TABS
650.0000 mg | ORAL_TABLET | ORAL | Status: DC | PRN
  Administered 2012-07-29: 650 mg via ORAL
  Filled 2012-07-28: qty 2

## 2012-07-28 MED ORDER — NITROGLYCERIN 0.4 MG SL SUBL: 0.4000 mg | SUBLINGUAL_TABLET | SUBLINGUAL | Status: DC | PRN

## 2012-07-28 MED ORDER — ALBUTEROL SULFATE HFA 108 (90 BASE) MCG/ACT IN AERS: 2.0000 | INHALATION_SPRAY | RESPIRATORY_TRACT | Status: DC | PRN

## 2012-07-28 MED ORDER — METOPROLOL SUCCINATE ER 25 MG PO TB24
25.0000 mg | ORAL_TABLET | Freq: Every day | ORAL | Status: DC
  Administered 2012-07-29 – 2012-08-01 (×4): 25 mg via ORAL
  Filled 2012-07-28 (×5): qty 1

## 2012-07-28 MED ORDER — SODIUM CHLORIDE 0.9 % IJ SOLN
3.0000 mL | Freq: Two times a day (BID) | INTRAMUSCULAR | Status: DC
  Administered 2012-07-28 – 2012-08-01 (×5): 3 mL via INTRAVENOUS

## 2012-07-28 MED ORDER — ONDANSETRON HCL 4 MG/2ML IJ SOLN: 4.0000 mg | Freq: Four times a day (QID) | INTRAMUSCULAR | Status: DC | PRN

## 2012-07-28 MED ORDER — FUROSEMIDE 40 MG PO TABS
40.0000 mg | ORAL_TABLET | Freq: Two times a day (BID) | ORAL | Status: DC
  Administered 2012-07-29 – 2012-08-01 (×8): 40 mg via ORAL
  Filled 2012-07-28 (×13): qty 1

## 2012-07-28 MED ORDER — ENOXAPARIN SODIUM 40 MG/0.4ML ~~LOC~~ SOLN
40.0000 mg | Freq: Every day | SUBCUTANEOUS | Status: DC
  Administered 2012-07-28: 40 mg via SUBCUTANEOUS
  Filled 2012-07-28 (×3): qty 0.4

## 2012-07-28 MED ORDER — SODIUM CHLORIDE 0.9 % IV SOLN
INTRAVENOUS | Status: DC
  Administered 2012-07-28: 10 mL/h via INTRAVENOUS

## 2012-07-28 MED ORDER — MIDAZOLAM HCL 10 MG/2ML IJ SOLN
INTRAMUSCULAR | Status: DC | PRN
  Administered 2012-07-28 (×4): 2 mg via INTRAVENOUS
  Administered 2012-07-28: 1 mg via INTRAVENOUS

## 2012-07-28 MED ORDER — ASPIRIN 81 MG PO TABS: 81.0000 mg | ORAL_TABLET | Freq: Every day | ORAL | Status: DC

## 2012-07-28 MED ORDER — FENTANYL CITRATE 0.05 MG/ML IJ SOLN
INTRAMUSCULAR | Status: DC | PRN
  Administered 2012-07-28: 25 ug via INTRAVENOUS
  Administered 2012-07-28: 50 ug via INTRAVENOUS
  Administered 2012-07-28: 25 ug via INTRAVENOUS

## 2012-07-28 MED ORDER — INSULIN ASPART 100 UNIT/ML ~~LOC~~ SOLN
0.0000 [IU] | Freq: Three times a day (TID) | SUBCUTANEOUS | Status: DC
  Administered 2012-07-29 (×3): 3 [IU] via SUBCUTANEOUS
  Administered 2012-07-30: 2 [IU] via SUBCUTANEOUS
  Administered 2012-07-30: 5 [IU] via SUBCUTANEOUS

## 2012-07-28 MED ORDER — SODIUM CHLORIDE 0.9 % IJ SOLN: 3.0000 mL | INTRAMUSCULAR | Status: DC | PRN

## 2012-07-28 MED ORDER — FENTANYL CITRATE 0.05 MG/ML IJ SOLN
INTRAMUSCULAR | Status: AC
  Filled 2012-07-28: qty 2

## 2012-07-28 MED ORDER — BUTAMBEN-TETRACAINE-BENZOCAINE 2-2-14 % EX AERO
INHALATION_SPRAY | CUTANEOUS | Status: DC | PRN
  Administered 2012-07-28: 2 via TOPICAL

## 2012-07-28 MED ORDER — ASPIRIN EC 81 MG PO TBEC
81.0000 mg | DELAYED_RELEASE_TABLET | Freq: Every day | ORAL | Status: DC
  Administered 2012-07-29 – 2012-08-01 (×3): 81 mg via ORAL
  Filled 2012-07-28 (×5): qty 1

## 2012-07-28 MED ORDER — VANCOMYCIN HCL 10 G IV SOLR
1250.0000 mg | Freq: Two times a day (BID) | INTRAVENOUS | Status: DC
  Administered 2012-07-28 – 2012-08-02 (×10): 1250 mg via INTRAVENOUS
  Filled 2012-07-28 (×14): qty 1250

## 2012-07-28 MED ORDER — LINAGLIPTIN 5 MG PO TABS
5.0000 mg | ORAL_TABLET | Freq: Every day | ORAL | Status: DC
  Administered 2012-07-29 – 2012-08-01 (×4): 5 mg via ORAL
  Filled 2012-07-28 (×5): qty 1

## 2012-07-28 MED ORDER — MIDAZOLAM HCL 5 MG/ML IJ SOLN
INTRAMUSCULAR | Status: AC
  Filled 2012-07-28: qty 2

## 2012-07-28 MED ORDER — ATORVASTATIN CALCIUM 40 MG PO TABS
40.0000 mg | ORAL_TABLET | Freq: Every day | ORAL | Status: DC
  Administered 2012-07-28 – 2012-08-01 (×5): 40 mg via ORAL
  Filled 2012-07-28 (×7): qty 1

## 2012-07-28 MED ORDER — LISINOPRIL 5 MG PO TABS
5.0000 mg | ORAL_TABLET | Freq: Every day | ORAL | Status: DC
  Administered 2012-07-29 – 2012-08-01 (×4): 5 mg via ORAL
  Filled 2012-07-28 (×6): qty 1

## 2012-07-28 MED ORDER — METFORMIN HCL 500 MG PO TABS
1000.0000 mg | ORAL_TABLET | Freq: Every day | ORAL | Status: DC
  Administered 2012-07-29 – 2012-07-30 (×2): 1000 mg via ORAL
  Filled 2012-07-28 (×3): qty 2

## 2012-07-28 MED ORDER — SODIUM CHLORIDE 0.9 % IV SOLN: 250.0000 mL | INTRAVENOUS | Status: DC | PRN

## 2012-07-28 NOTE — Progress Notes (Addendum)
VASCULAR LAB PRELIMINARY  PRELIMINARY  PRELIMINARY  PRELIMINARY  Pre-op Cardiac Surgery  Carotid Findings:  Bilateral:  No evidence of hemodynamically significant internal carotid artery stenosis.   Vertebral artery flow is antegrade.  Atypical flow noted throughout bilateral carotid systems with decreased diastolic component.    Upper Extremity Right Left  Brachial Pressures 112 Triphasic 114 Triphasic  Radial Waveforms Triphasic Triphasic  Ulnar Waveforms Triphasic Triphasic  Palmar Arch (Allen's Test) Normal Abnormal   Findings:  Doppler waveforms remained normal with both radial and ulnar compressions on the right. Left Doppler waveforms remained normal with radial compression and diminished greater than 50% with ulnar compression.    Lower  Extremity Right Left  Dorsalis Pedis 165 Biphasic 139 Biphasic      Posterior Tibial 172 Triphasic 171 Triphasic  Ankle/Brachial Indices 1.51 1.50    Findings:  ABIs indicate normal arterial flow bilaterally at rest.   SLAUGHTER, [place], RVS 07/28/2012, 12:25 PM

## 2012-07-28 NOTE — CV Procedure (Signed)
    Transesophageal Echocardiogram Note  Roy [name] [id number] [birth year]  Procedure: Transesophageal Echocardiogram Indications: aortic valve mass, aortic insufficiency  Procedure Details Consent: Obtained Time Out: Verified patient identification, verified procedure, site/side was marked, verified correct patient position, special equipment/implants available, Radiology Safety Procedures followed,  medications/allergies/relevent history reviewed, required imaging and test results available.  Performed  Medications: Fentanyl: 100 mcg IV Versed: 9 mg IV  Left Ventrical:  Normal LV function  Mitral Valve: moderate MR, no vegetation  Aortic Valve: 3 leaflet valve.  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  I cannot R/o a flail leaflet but it appears to be more c/w a vegetation.  Tricuspid Valve: normal  Pulmonic Valve: normal  Left Atrium/ Left atrial appendage: no thrombi  Atrial septum: normal  Aorta: normal   Complications: No apparent complications Patient did tolerate procedure well.   Vesta Mixer, Montez Hageman., MD, [facility] 07/28/2012, 4:00 PM

## 2012-07-28 NOTE — Progress Notes (Signed)
ANTIBIOTIC CONSULT NOTE - INITIAL  Pharmacy Consult for vancomycin Indication: endocarditis  Allergies  Allergen Reactions  . Contrast Media (Iodinated Diagnostic Agents) Other (See Comments)    Pt'shad streaks up and down his arms    Patient Measurements: Height: 5\' 9"  (175.3 cm) Weight: 194 lb 0.1 oz (88 kg) IBW/kg (Calculated) : 70.7    Vital Signs: Temp: 98.6 F (37 C) (01/08 1451) Temp src: Oral (01/08 1451) BP: 94/40 mmHg (01/08 1600) Pulse Rate: 78  (01/08 1100) Intake/Output from previous day:   Intake/Output from this shift: Total I/O In: 3 [I.V.:3] Out: -   Labs:  Basename 07/28/12 1430  WBC 5.0  HGB 12.3*  PLT 96*  LABCREA --  CREATININE 1.10   Estimated Creatinine Clearance: 88.2 ml/min (by C-G formula based on Cr of 1.1). No results found for this basename: VANCOTROUGH:2,VANCOPEAK:2,VANCORANDOM:2,GENTTROUGH:2,GENTPEAK:2,GENTRANDOM:2,TOBRATROUGH:2,TOBRAPEAK:2,TOBRARND:2,AMIKACINPEAK:2,AMIKACINTROU:2,AMIKACIN:2, in the last 72 hours   Microbiology: No results found for this or any previous visit (from the past 720 hour(s)).  Medical History: Past Medical History  Diagnosis Date  . 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 Diabetes mellitus     type II  . Hyperlipidemia   . Hypertension   . Melanoma   . Heart attack 12-2011    [facility]  . Shortness of breath     Medications:  Prescriptions prior to admission  Medication Sig Dispense Refill  . acetaminophen (TYLENOL) 500 MG tablet Take 500 mg by mouth every 6 (six) hours as needed. For pain      . albuterol (PROVENTIL HFA;VENTOLIN HFA) 108 (90 BASE) MCG/ACT inhaler Inhale 2 puffs into the lungs every 4 (four) hours as needed for wheezing.  1 Inhaler  3  . aspirin 81 MG tablet Take 81 mg by mouth daily.      . furosemide (LASIX) 20 MG tablet Take 1 tablet (20 mg total) by mouth 2 (two) times daily.  60 tablet  11  . levofloxacin  (LEVAQUIN) 500 MG tablet Take 1 tablet (500 mg total) by mouth daily.  14 tablet  0  . metoprolol succinate (TOPROL XL) 25 MG 24 hr tablet Take 2 tablets (50 mg total) by mouth daily.  60 tablet  3  . NITROSTAT 0.4 MG SL tablet Place 0.4 mg under the tongue every 5 (five) minutes as needed.       . pioglitazone (ACTOS) 15 MG tablet Take 15 mg by mouth daily.      . prasugrel (EFFIENT) 10 MG TABS Take 1 tablet (10 mg total) by mouth daily.  90 tablet  3  . rosuvastatin (CRESTOR) 20 MG tablet Take 1 tablet (20 mg total) by mouth daily.  90 tablet  3  . sitaGLIPtan-metformin (JANUMET) 50-1000 MG per tablet Take 1 tablet by mouth daily.        Assessment: 52 year old man s/p TEE today with findings suggestive of endocarditis.  Vancomycin to start after blood cultures obtained.  He has been taking levofloxacin for bronchitis.  Goal of Therapy:  Vancomycin trough level 15-20 mcg/ml  Plan:  Vancomycin 1250mg  IV q12 Monitor trough at steady state Monitor renal function  Mickeal Skinner 07/28/2012,4:23 PM

## 2012-07-28 NOTE — Interval H&P Note (Signed)
History and Physical Interval Note:  07/28/2012 3:23 PM  Roy [name]  has presented today for surgery, with the diagnosis of Wide Open AI  The various methods of treatment have been discussed with the patient and family. After consideration of risks, benefits and other options for treatment, the patient has consented to  Procedure(s) (LRB) with comments: TRANSESOPHAGEAL ECHOCARDIOGRAM (TEE) (N/A) as a surgical intervention .  The patient's history has been reviewed, patient examined, no change in status, stable for surgery.  I have reviewed the patient's chart and labs.  Questions were answered to the patient's satisfaction.    I have reviewed the echo from 1/6.  He has severe AI with a flail AV.  Scheduled for TEE.   Elyn Aquas.

## 2012-07-28 NOTE — OR Nursing (Signed)
Hypotensive, Dr Elease Hashimoto aware. IVF increased BP at 1545 82/28.  BP 1552 88/38

## 2012-07-28 NOTE — Progress Notes (Signed)
Echocardiogram performed.  

## 2012-07-28 NOTE — Consult Note (Signed)
Reason for Consult:Aortic insufficiency  Referring Physician: Dr. McLean/ Gollan/ Arida  Roy [name] is an 51 y.o. male.  HPI: 51 yo WM with cc/o shortness of breath with exertion  51 yo WM with diabetes, hypertension, hyperlipidemia and known CAD. He had an MI in 6/13 treated with PTCA and stenting. He says there was another vessel that was "65%" that was not stented. He did well until December. He had a knee arthroscopy. Shortly following that he had a NQWMI. Repeat cath showed progression and another stent was placed. He has been on ASA and effient. He relates the onset of his SOB to the time of that second catheterization. He says that during the procedure he became acutely SOB and could not lie flat. He says that ever since then he has had trouble lying flat and gets SOB with moderate exertion. He has not had any leg swelling. He says he has been having some vague right sided CP 1-3/10, that is different from his MI pain.  He went to the Cardiologist to see about the SOB and was found to have a new diastolic murmur. An echocardiogram showed severe AI. 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 There was a question of a vegetation.  Past Medical History   Diagnosis  Date   .  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  Diabetes mellitus      type II   .  Hyperlipidemia    .  Hypertension    .  Melanoma    .  Heart attack  12-2011     [facility]   .  Shortness of breath     Past Surgical History   Procedure  Date   .  Rotator cuff repair      RIGHT   .  Knee arthoscopy      LEFT KNEE   .  Ulnar tunnel release      RIGHT   .  Keratotomies      BILATERAL   .  Septoplasty    .  Tonsillectomy    .  Knee arthroscopy  06/28/2012     Procedure: ARTHROSCOPY KNEE; Surgeon: Steve Lucey, MD; Location: [facility] OR; Service: Orthopedics; Laterality: Left;   .  Cardiac catheterization  12-22-11      with OM1 Promus stent 12/22/11   .  Knee surgery      left   .  Eye surgery     Family History   Problem  Relation  Age of Onset   .  Coronary artery disease  Mother    .  Heart disease  Father    .  Heart attack  Paternal Grandfather    .  Lung cancer  Mother    .  Diabetes  Paternal Grandmother    .  Hyperlipidemia  Brother    .  Hyperlipidemia        father's side    .  Melanoma        fh    Social History: reports that he has never smoked. He has never used smokeless tobacco. He reports that he does not drink alcohol or use illicit drugs.  Allergies:  Allergies   Allergen  Reactions   .  Contrast Media (Iodinated Diagnostic Agents)  Other (See Comments)     Pt'shad streaks up and down his arms    Medications:  Prior   to Admission:  Prescriptions prior to admission   Medication  Sig  Dispense  Refill   .  albuterol (PROVENTIL HFA;VENTOLIN HFA) 108 (90 BASE) MCG/ACT inhaler  Inhale 2 puffs into the lungs every 4 (four) hours as needed for wheezing.  1 Inhaler  3   .  aspirin 81 MG tablet  Take 81 mg by mouth daily.     .  furosemide (LASIX) 20 MG tablet  Take 1 tablet (20 mg total) by mouth 2 (two) times daily.  60 tablet  11   .  metoprolol succinate (TOPROL XL) 25 MG 24 hr tablet  Take 2 tablets (50 mg total) by mouth daily.  60 tablet  3   .  NITROSTAT 0.4 MG SL tablet  Place 0.4 mg under the tongue every 5 (five) minutes as needed.     .  pioglitazone (ACTOS) 15 MG tablet  Take 15 mg by mouth daily.     .  prasugrel (EFFIENT) 10 MG TABS  Take 1 tablet (10 mg total) by mouth daily.  90 tablet  3   .  rosuvastatin (CRESTOR) 20 MG tablet  Take 1 tablet (20 mg total) by mouth daily.  90 tablet  3   .  sitaGLIPtan-metformin (JANUMET) 50-1000 MG per tablet  Take 1 tablet by mouth daily.      Results for orders placed during the hospital encounter of 07/28/12 (from the past 48 hour(s))   GLUCOSE, CAPILLARY Status: Abnormal    Collection Time    07/28/12 11:24 AM     Component  Value  Range  Comment    Glucose-Capillary  182 (*)  70 - 99 mg/dL     Comment 1  Documented in Chart      Comment 2  Notify RN     CBC WITH DIFFERENTIAL Status: Abnormal    Collection Time    07/28/12 2:30 PM   Component  Value  Range  Comment    WBC  5.0  4.0 - 10.5 K/uL     RBC  4.15 (*)  4.22 - 5.81 MIL/uL     Hemoglobin  12.3 (*)  13.0 - 17.0 g/dL     HCT  36.1 (*)  39.0 - 52.0 %     MCV  87.0  78.0 - 100.0 fL     MCH  29.6  26.0 - 34.0 pg     MCHC  34.1  30.0 - 36.0 g/dL     RDW  13.8  11.5 - 15.5 %     Platelets  96 (*)  150 - 400 K/uL  PLATELET COUNT CONFIRMED BY SMEAR    Neutrophils Relative  54  43 - 77 %     Neutro Abs  2.5  1.7 - 7.7 K/uL     Lymphocytes Relative  35  12 - 46 %     Lymphs Abs  1.6  0.7 - 4.0 K/uL     Monocytes Relative  9  3 - 12 %     Monocytes Absolute  0.4  0.1 - 1.0 K/uL     Eosinophils Relative  2  0 - 5 %     Eosinophils Absolute  0.1  0.0 - 0.7 K/uL     Basophils Relative  0  0 - 1 %     Basophils Absolute  0.0  0.0 - 0.1 K/uL    COMPREHENSIVE METABOLIC PANEL Status: Abnormal    Collection Time    07/28/12   2:30 PM   Component  Value  Range  Comment    Sodium  138  135 - 145 mEq/L     Potassium  4.1  3.5 - 5.1 mEq/L     Chloride  101  96 - 112 mEq/L     CO2  26  19 - 32 mEq/L     Glucose, Bld  119 (*)  70 - 99 mg/dL     BUN  21  6 - 23 mg/dL     Creatinine, Ser  1.10  0.50 - 1.35 mg/dL     Calcium  9.4  8.4 - 10.5 mg/dL     Total Protein  7.0  6.0 - 8.3 g/dL     Albumin  3.8  3.5 - 5.2 g/dL     AST  17  0 - 37 U/L     ALT  14  0 - 53 U/L     Alkaline Phosphatase  61  39 - 117 U/L     Total Bilirubin  0.5  0.3 - 1.2 mg/dL     GFR calc non Af Amer  77 (*)  >90 mL/min     GFR calc Af Amer  89 (*)  >90 mL/min    PRO B NATRIURETIC PEPTIDE Status: Abnormal    Collection Time    07/28/12 2:30 PM   Component  Value  Range  Comment    Pro B Natriuretic peptide (BNP)  1564.0 (*)  0 - 125 pg/mL    GLUCOSE, CAPILLARY Status: Abnormal     Collection Time    07/28/12 4:51 PM   Component  Value  Range  Comment    Glucose-Capillary  111 (*)  70 - 99 mg/dL     Comment 1  Documented in Chart      Comment 2  Notify RN      X-ray Chest Pa And Lateral  07/28/2012 *RADIOLOGY REPORT* Clinical Data: Cough and shortness of breath CHEST - 2 VIEW Comparison: June 23, 2012 Findings: Lungs clear. Heart size and pulmonary vascularity are normal. No adenopathy. No bone lesions. IMPRESSION: No abnormality noted. Original Report Authenticated By: William Woodruff, M.D.   Review of Systems  Constitutional: Positive for fever (mild, low grade), chills (feels hot and cold intermittently) and malaise/fatigue.  HENT: Negative for neck pain.  Respiratory: Positive for shortness of breath (with exertion).  Cardiovascular: Positive for chest pain and orthopnea.  Gastrointestinal: Negative for nausea, vomiting and abdominal pain.  Musculoskeletal: Negative for myalgias and back pain.  Skin: Negative for itching and rash.  Neurological: Negative. Negative for headaches.  Endo/Heme/Allergies: Negative.  All other systems reviewed and are negative.   Blood pressure 96/43, pulse 78, temperature 98.6 F (37 C), temperature source Oral, resp. rate 22, height 5' 9" (1.753 m), weight 194 lb 0.1 oz (88 kg), SpO2 93.00%.  Physical Exam  Vitals reviewed.  Constitutional: He is oriented to person, place, and time. He appears well-developed and well-nourished. No distress.  HENT:  Head: Normocephalic and atraumatic.  Eyes: EOM are normal. Pupils are equal, round, and reactive to light.  Neck: Neck supple. No JVD present. No thyromegaly present.  Cardiovascular: Normal rate and regular rhythm. Exam reveals no gallop and no friction rub.  Murmur (2/6 diastolic) heard.  Respiratory: Effort normal. No respiratory distress. He has no wheezes. He has no rales.  GI: Soft. There is no tenderness.  Musculoskeletal: Normal range of motion. He exhibits no edema.   Lymphadenopathy:  He   has no cervical adenopathy.  Neurological: He is alert and oriented to person, place, and time. No cranial nerve deficit.  No focal motor deficits  Skin: Skin is warm and dry.   2D Echo 07/29/11  *[facility]* [address] [place], [place] [address] [phone]  ------------------------------------------------------------ Echocardiography  Patient: [name], Roy L MR #: [id number] Study Date: 07/28/2012 Gender: M Age: 51 Height: 175.3cm Weight: 91.2kg BSA: 2.07m^2 Pt. Status: Room:  ATTENDING Arida, Muhammad ORDERING Arida, Muhammad REFERRING Arida, Muhammad PERFORMING [place] SONOGRAPHER Sherriann Reid, RDCS cc:  ------------------------------------------------------------ LV EF: 60% - 65%  ------------------------------------------------------------ Indications: Dyspnea 786.09.  ------------------------------------------------------------ History: PMH: Acquired from the patient and from the patient's chart. Chest pain. Dyspnea and murmur. Coronary artery disease. PMH: Myocardial infarction. Risk factors: Hypertension. Diabetes mellitus. Dyslipidemia.  ------------------------------------------------------------ Study Conclusions  - Left ventricle: The cavity size was normal. Wall thickness was normal. Systolic function was normal. The estimated ejection fraction was in the range of 60% to 65%. Wall motion was normal; there were no regional wall motion abnormalities. Doppler parameters are consistent with a restrictive pattern, indicative of decreased left ventricular diastolic compliance and/or increased left atrial pressure (grade 3 diastolic dysfunction). - Aortic valve: Severe regurgitation. - Mitral valve: Mild regurgitation. - Left atrium: The atrium was mildly to moderately dilated. - Atrial septum: No defect or patent foramen ovale was identified. - Pulmonary arteries: PA peak pressure: 43mm Hg  (S).  ------------------------------------------------------------ Labs, prior tests, procedures, and surgery: Catheterization (June 2013). There was a stenosis which was treated with a stent.  Echocardiography. M-mode, complete 2D, spectral Doppler, and color Doppler. Height: Height: 175.3cm. Height: 69in. Weight: Weight: 91.2kg. Weight: 200.6lb. Body mass index: BMI: 29.7kg/m^2. Body surface area: BSA: 2.07m^2. Blood pressure: 108/58. Patient status: Outpatient. Location: [facility]  ------------------------------------------------------------  ------------------------------------------------------------ Left ventricle: The cavity size was normal. Wall thickness was normal. Systolic function was normal. The estimated ejection fraction was in the range of 60% to 65%. Wall motion was normal; there were no regional wall motion abnormalities. Doppler parameters are consistent with a restrictive pattern, indicative of decreased left ventricular diastolic compliance and/or increased left atrial pressure (grade 3 diastolic dysfunction).  ------------------------------------------------------------ Aortic valve: Questionable flail aortic valve leaflet. Trileaflet. Doppler: Severe regurgitation.  ------------------------------------------------------------ Aorta: The aorta was normal, not dilated, and non-diseased. Ascending aorta: The ascending aorta was mildly dilated.  ------------------------------------------------------------ Mitral valve: Structurally normal valve. Leaflet separation was normal. Doppler: Transvalvular velocity was within the normal range. There was no evidence for stenosis. Mild regurgitation. Peak gradient: 6mm Hg (D).  ------------------------------------------------------------ Left atrium: The atrium was mildly to moderately dilated.  ------------------------------------------------------------ Atrial septum: No defect or patent foramen ovale  was identified.  ------------------------------------------------------------ Right ventricle: The cavity size was normal. Wall thickness was normal. Systolic function was normal.  ------------------------------------------------------------ Pulmonic valve: Structurally normal valve. Cusp separation was normal. Doppler: Transvalvular velocity was within the normal range. Trivial regurgitation.  ------------------------------------------------------------ Tricuspid valve: Structurally normal valve. Leaflet separation was normal. Doppler: Transvalvular velocity was within the normal range. Mild regurgitation.  ------------------------------------------------------------ Pulmonary artery: The main pulmonary artery was normal-sized.  ------------------------------------------------------------ Right atrium: The atrium was normal in size.  ------------------------------------------------------------ Pericardium: The pericardium was normal in appearance.  ------------------------------------------------------------ Systemic veins: Inferior vena cava: The vessel was normal in size; the respirophasic diameter changes were in the normal range (= 50%); findings are consistent with normal central venous pressure.  ------------------------------------------------------------ Post procedure conclusions Ascending Aorta:  - The aorta was normal, not dilated, and   non-diseased  Assessment/Plan:  51 yo WM with new onset AI. 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 His symptoms relate temporally to his cath in mid December. There is a question of a vegetation vs a flail [place] leaflet. He has had some hot and cold spells but no high grade fevers. Given the temporal association I suspect this was mechanical trauma at the time of cath, but blood cultures have been drawn and antibiotics started in case this is endocarditis.  In any event he will need surgery for his AI.  There is an outside chance that the valve may be repairable, but in all likelihood it will need to be replaced. I have not yet had the opportunity to review his cath films to [place] but it does appear from the report he has some disease in the RCA.  I discussed with the patient and his wife the indications, risks, benefits and alternatives to AVR. We discussed the general nature of the procedure, including the need for general anesthesia and cardiopulmonary bypass. He understands the risks include but are not limited to death, MI, stroke, DVT, PE, bleeding, possible need for transfusion, infection, other organ system dysfunction (resp,renal, GI), and possible heart block requiring a permanent pacemaker.  We also discussed options for valve replacement- mechanical v tissue and the relative advantages and disadvantages of each. He very strongly wants to avoid coumadin, but will think about it over the next few days.  He has been on effient and will need to be off it for 5 days prior to surgery.We are tenatively planning to proceed with AVR on Monday 1/13  Ryden Wainer C  07/28/2012, 6:57 PM     

## 2012-07-28 NOTE — Progress Notes (Addendum)
Patient ID: Roy [name], male   DOB: [name] 12, 1962, 52 y.o.   MRN: [id number]   TEE reviewed with Dr. Elease Hashimoto.  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  Cannot rule out pure leaflet flail but concerning for vegetation.  Patient has also had subjective fever/rigors.  Blood cultures sent, will start antibiotics empirically (vancomycin) and involve the ID service.   Roy [name] 07/28/2012 4:09 PM

## 2012-07-28 NOTE — Consult Note (Signed)
INFECTIOUS DISEASE CONSULT NOTE  Date of Admission:  07/28/2012  Date of Consult:  07/28/2012  Reason for Consult: endocarditis Referring Physician: Shirlee Latch  Impression/Recommendation Endocarditis AI  Would- start vanco as you have BCx now Consider MRI of neck to evaluate as possible embolic source.    Comment- has had 2 procedures within the past month which could predispose him to seeing his heart valve. His history of fevers and chills and questionable rigors at home also are suggestive of this. The patient asks about his need for heart valve surgery, at present I am unable to comment on this as it relates to his infection or possible infection. Certainly if his aortic insufficiency is severe enough, he would be a candidate for surgery I will of course defer the decision to have him evaluated by CVTS to his cardiologist.  Thank you so much for this interesting consult,   Roy [name] [phone]  Roy [name] is an 52 y.o. male.  HPI: 52 yo M with hx of DM2 > 29yrs, CAD (MI and stenting June-2013), melanoma. He underwent L knee arthroscopy on 06-29-12 and then was hospitalized at [facility] 07-01-12 for NSTEMI. 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 His course was complicated by flash pulmonary edema. He was felt to have a persistent infiltrate and was given a course of azithro followed by levaquin (beginnig on 07-15-12). He was to noted a new murmur and continued to need diuretics. He underwent TTE and was found to have severe AI and a flail leaflet, mild MR, grade 3 diastolic dysfunction. 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   Past Medical History  Diagnosis Date  . 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 Diabetes mellitus     type II  . Hyperlipidemia   . Hypertension   . Melanoma   . Heart attack 12-2011    [facility]  .  Shortness of breath     Past Surgical History  Procedure Date  . Rotator cuff repair     RIGHT  . Knee arthoscopy     LEFT KNEE  . Ulnar tunnel release     RIGHT  . Keratotomies     BILATERAL  . Septoplasty   . Tonsillectomy   . Knee arthroscopy 06/28/2012    Procedure: ARTHROSCOPY KNEE;  Surgeon: Dannielle Huh, MD;  Location: [facility] OR;  Service: Orthopedics;  Laterality: Left;  . Cardiac catheterization 12-22-11    with OM1 Promus stent 12/22/11  . Knee surgery     left  . Eye surgery      Allergies  Allergen Reactions  . Contrast Media (Iodinated Diagnostic Agents) Other (See Comments)    Pt'shad streaks up and down his arms    Medications:  Scheduled:   . aspirin EC  81 mg Oral Daily  . atorvastatin  40 mg Oral q1800  . enoxaparin (LOVENOX) injection  40 mg Subcutaneous QHS  . furosemide  40 mg Oral BID  . insulin aspart  0-15 Units Subcutaneous TID WC  . linagliptin  5 mg Oral Daily  . lisinopril  5 mg Oral Daily  . metFORMIN  1,000 mg Oral Q breakfast  . metoprolol succinate  25 mg Oral Daily  . sodium chloride  3 mL Intravenous Q12H  . vancomycin  1,250 mg Intravenous Q12H    Total days of antibiotics 0  Social History:  reports that he has never smoked. He has never used smokeless tobacco. He reports that he does not drink alcohol or use illicit drugs.  Family History  Problem Relation Age of Onset  . Coronary artery disease Mother   . Heart disease Father   . Heart attack Paternal Grandfather   . Lung cancer Mother   . Diabetes Paternal Grandmother   . Hyperlipidemia Brother   . Hyperlipidemia      father's side  . Melanoma      fh    General ROS: No vision change. He has had left neck pain for over a month. no reecent dental procedures. His catheterization sites on his leg healed well. He has had fevers and chills at home. He did not take his temperature. He has normal bowel movements. Normal urination. He currently denies cough or shortness of  breath. He has had occasional headaches. He has chest pain that is roughly 1-2/10. See history of present illness  Blood pressure 104/52, pulse 78, temperature 98.6 F (37 C), temperature source Oral, resp. rate 22, height 5\' 9"  (1.753 m), weight 88 kg (194 lb 0.1 oz), SpO2 93.00%. General appearance: alert, cooperative and no distress Eyes: negative findings: conjunctivae and sclerae normal and pupils equal, round, reactive to light and accomodation Throat: lips, mucosa, and tongue normal; teeth and gums normal Neck: no adenopathy and supple, symmetrical, trachea midline Lungs: clear to auscultation bilaterally Heart: regular rate and rhythm and systolic murmur: late systolic 3/6, decrescendo at 2nd left intercostal space Abdomen: normal findings: bowel sounds normal and soft, non-tender Extremities: edema None and His right groin catheterization sites are well-healed. There is no erythema or fluctuance. He has no nailbed lesions. He has no diabetic foot lesions.   Results for orders placed during the hospital encounter of 07/28/12 (from the past 48 hour(s))  GLUCOSE, CAPILLARY     Status: Abnormal   Collection Time   07/28/12 11:24 AM      Component Value Range Comment   Glucose-Capillary 182 (*) 70 - 99 mg/dL    Comment 1 Documented in Chart      Comment 2 Notify RN     CBC WITH DIFFERENTIAL     Status: Abnormal   Collection Time   07/28/12  2:30 PM      Component Value Range Comment   WBC 5.0  4.0 - 10.5 K/uL    RBC 4.15 (*) 4.22 - 5.81 MIL/uL    Hemoglobin 12.3 (*) 13.0 - 17.0 g/dL    HCT 16.1 (*) 09.6 - 52.0 %    MCV 87.0  78.0 - 100.0 fL    MCH 29.6  26.0 - 34.0 pg    MCHC 34.1  30.0 - 36.0 g/dL    RDW 04.5  40.9 - 81.1 %    Platelets 96 (*) 150 - 400 K/uL PLATELET COUNT CONFIRMED BY SMEAR   Neutrophils Relative 54  43 - 77 %    Neutro Abs 2.5  1.7 - 7.7 K/uL    Lymphocytes Relative 35  12 - 46 %    Lymphs Abs 1.6  0.7 - 4.0 K/uL    Monocytes Relative 9  3 - 12 %     Monocytes Absolute 0.4  0.1 - 1.0 K/uL    Eosinophils Relative 2  0 - 5 %    Eosinophils Absolute 0.1  0.0 - 0.7 K/uL    Basophils Relative 0  0 - 1 %    Basophils  Absolute 0.0  0.0 - 0.1 K/uL   COMPREHENSIVE METABOLIC PANEL     Status: Abnormal   Collection Time   07/28/12  2:30 PM      Component Value Range Comment   Sodium 138  135 - 145 mEq/L    Potassium 4.1  3.5 - 5.1 mEq/L    Chloride 101  96 - 112 mEq/L    CO2 26  19 - 32 mEq/L    Glucose, Bld 119 (*) 70 - 99 mg/dL    BUN 21  6 - 23 mg/dL    Creatinine, Ser 1.61  0.50 - 1.35 mg/dL    Calcium 9.4  8.4 - 09.6 mg/dL    Total Protein 7.0  6.0 - 8.3 g/dL    Albumin 3.8  3.5 - 5.2 g/dL    AST 17  0 - 37 U/L    ALT 14  0 - 53 U/L    Alkaline Phosphatase 61  39 - 117 U/L    Total Bilirubin 0.5  0.3 - 1.2 mg/dL    GFR calc non Af Amer 77 (*) >90 mL/min    GFR calc Af Amer 89 (*) >90 mL/min   PRO B NATRIURETIC PEPTIDE     Status: Abnormal   Collection Time   07/28/12  2:30 PM      Component Value Range Comment   Pro B Natriuretic peptide (BNP) 1564.0 (*) 0 - 125 pg/mL       Component Value Date/Time   SDES URINE, CLEAN CATCH 04/13/2010 0902   SPECREQUEST NONE 04/13/2010 0902   CULT NO GROWTH 04/13/2010 0902   REPTSTATUS 04/14/2010 FINAL 04/13/2010 0902   X-ray Chest Pa And Lateral  07/28/2012  *RADIOLOGY REPORT*  Clinical Data: Cough and shortness of breath  CHEST - 2 VIEW  Comparison: June 23, 2012  Findings:  Lungs clear.  Heart size and pulmonary vascularity are normal.  No adenopathy.  No bone lesions.  IMPRESSION: No abnormality noted.   Original Report Authenticated By: Bretta Bang, M.D.    No results found for this or any previous visit (from the past 240 hour(s)).    07/28/2012, 5:19 PM     LOS: 0 days

## 2012-07-28 NOTE — Progress Notes (Signed)
  Echocardiogram Echocardiogram Transesophageal has been performed.  Jorje Guild 07/28/2012, 4:14 PM

## 2012-07-28 NOTE — H&P (Addendum)
History and Physical  Patient ID: Roy [name] MRN: [id number], SOB: 11/08/1960 52 y.o. Date of Encounter: 07/28/2012, 11:51 AM  Primary Physician: Cala Bradford, MD Primary Cardiologist: Dr. Mariah Milling in [place]  Chief Complaint: dyspnea, orthopnea, PND  HPI: 52 y.o. male w/ PMHx significant for CAD (s/p DES to OM 6/13, s/p BMS x2 mid LCx 12/13), HTN, HLD, DMII, and melanoma who underwent outpatient echocardiogram in the office today for complaints of continued shortness of breath and was found to have severe AI for which he was admitted directly to [facility] for further evaluation and treament.  He was hospitalized at [facility] for NSTEMI 07/01/12 and found to have an occluded mid LCx with large thrombus. He underwent thrombectomy and placement of BMS x2 to the mid LCx. His Plavix was changed to Effient and ASA continued. He went into flash pulmonary edema after diagnostic cath and before the PCI. He was treated with aggressive diuretics for suspected flash pulmonary edema. He had mild improvement of his symptoms though this took 2 days time, requiring nebulizer treatments and supplemental oxygen. CXR was concerning for possible infiltrate and he was treated with a Z-Pak (subsequently placed on Levaquin for continued cough on 12/26). He noted gradual improvement in symptoms, but required continued diuretics. He was evaluated in clinic by Dr. Kirke Corin on 07/26/12 for complaints of significant orthopnea and PND. He denied chest pain. Dr. Kirke Corin noted a new MR murmur for which he underwent echocardiogram today which revealed severe AI, mild MR, EF 60-65%, no WMAs, grade 3 diastolic dysfunction. He was instructed to present to [facility] for direct admission with plans for further evaluation with TEE.  He reports feeling short of breath since his cath on 12/12. Worse at night. No sob prior to cath. No h/o of murmur or echo. Since his PCI he has continued to have mild intermittent chest  pain that occurs at random. Not necessarily r/t to exertion. (+) cough, chills, subjective fever. No syncope.    Past Medical History  Diagnosis Date  . 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 Diabetes mellitus     type II  . Hyperlipidemia   . Hypertension   . Melanoma   . Heart attack 12-2011    [facility]     Surgical History:  Past Surgical History  Procedure Date  . Rotator cuff repair     RIGHT  . Knee arthoscopy     LEFT KNEE  . Ulnar tunnel release     RIGHT  . Keratotomies     BILATERAL  . Septoplasty   . Tonsillectomy   . Knee arthroscopy 06/28/2012    Procedure: ARTHROSCOPY KNEE;  Surgeon: Dannielle Huh, MD;  Location: [facility] OR;  Service: Orthopedics;  Laterality: Left;  . Cardiac catheterization 12-22-11    with OM1 Promus stent 12/22/11  . Knee surgery     left     Home Meds: Medication Sig  acetaminophen (TYLENOL) 500 MG tablet Take 500 mg by mouth every 6 (six) hours as needed. For pain  albuterol (PROVENTIL HFA;VENTOLIN HFA) 108 (90 BASE) MCG/ACT inhaler Inhale 2 puffs into the lungs every 4 (four) hours as needed for wheezing.  aspirin 81 MG tablet Take 81 mg by mouth daily.  furosemide (LASIX) 20 MG tablet Take 1 tablet (20 mg total) by mouth 2 (two) times daily.  levofloxacin (LEVAQUIN) 500 MG tablet Take 1 tablet (500 mg total) by mouth daily.  metoprolol succinate (  TOPROL XL) 25 MG 24 hr tablet Take 2 tablets (50 mg total) by mouth daily.  NITROSTAT 0.4 MG SL tablet Place 0.4 mg under the tongue every 5 (five) minutes as needed.   pioglitazone (ACTOS) 15 MG tablet Take 15 mg by mouth daily.  prasugrel (EFFIENT) 10 MG TABS Take 1 tablet (10 mg total) by mouth daily.  rosuvastatin (CRESTOR) 20 MG tablet Take 1 tablet (20 mg total) by mouth daily.  sitaGLIPtan-metformin (JANUMET) 50-1000 MG per tablet Take 1 tablet by mouth daily.     Allergies:  Allergies  Allergen Reactions  . Contrast Media  (Iodinated Diagnostic Agents) Other (See Comments)    Pt'shad streaks up and down his arms    History   Social History  . Marital Status: Married    Spouse Name: N/A    Number of Children: N/A  . Years of Education: N/A   Occupational History  . Not on file.   Social History Main Topics  . Smoking status: Never Smoker   . Smokeless tobacco: Never Used  . Alcohol Use: No  . Drug Use: No  . Sexually Active:    Other Topics Concern  . Not on file   Social History Narrative  . No narrative on file     Family History  Problem Relation Age of Onset  . Coronary artery disease Mother   . Heart disease Father   . Heart attack Paternal Grandfather   . Lung cancer Mother   . Diabetes Paternal Grandmother   . Hyperlipidemia Brother   . Hyperlipidemia      father's side  . Melanoma      fh    Review of Systems: General: (+) chills, fever;  negative for night sweats or weight changes.  Cardiovascular: As per HPI Dermatological: negative for rash Respiratory: As per HPI Urologic: negative for hematuria Abdominal: negative for nausea, vomiting, diarrhea, bright red blood per rectum, melena, or hematemesis Neurologic: negative for visual changes, syncope, or dizziness All other systems reviewed and are otherwise negative except as noted above.  Labs: Blood cultures, CBC, CMET, BNP pending   Radiology/Studies:  CXR Pending   07/28/12 Echo Study Conclusions: - Left ventricle: The cavity size was normal. Wall thickness was normal. Systolic function was normal. The estimated ejection fraction was in the range of 60% to 65%. Wall motion was normal; there were no regional wall motion abnormalities. Doppler parameters are consistent with a restrictive pattern, indicative of decreased left ventricular diastolic compliance and/or increased left atrial pressure (grade 3 diastolic dysfunction).  - Aortic valve: Severe regurgitation. - Mitral valve: Mild regurgitation. - Left  atrium: The atrium was mildly to moderately dilated. - Atrial septum: No defect or patent foramen ovale was identified. - Pulmonary arteries: PA peak pressure: 43mm Hg (S).    EKG: NSR, inferior Qs  Physical Exam: Blood pressure 109/57, pulse 78, temperature 97.8 F (36.6 C), temperature source Oral, resp. rate 18, height 5\' 9"  (1.753 m), weight 194 lb 0.1 oz (88 kg), SpO2 99.00%. General: Well developed, well nourished, in no acute distress. Head: Normocephalic, atraumatic, sclera non-icteric, nares are without discharge Neck: Supple. Negative for carotid bruits. JVP 8-9 cm Lungs: Slight crackles at bases.  Heart: RRR with S1 S2. 3/6 pan-diastolic murmur at LLSB.  Abdomen: Soft, non-tender, non-distended with normoactive bowel sounds. No rebound/guarding. No obvious abdominal masses. Msk:  Strength and tone appear normal for age. Extremities: No edema. No clubbing or cyanosis. Distal pedal pulses are 2+ and equal  bilaterally. Neuro: Alert and oriented X 3. Moves all extremities spontaneously. Psych:  Responds to questions appropriately with a normal affect.    ASSESSMENT AND PLAN:  52 y.o. male w/ PMHx significant for CAD (s/p DES to OM 6/13, s/p BMS x2 mid LCx 12/13), HTN, HLD, DMII, and melanoma who underwent outpatient echocardiogram in the office today for complaints of continued shortness of breath and was found to have severe AI for which he was admitted directly to [facility] for further evaluation and treament.  See MD note below  Signed, Cortina Vultaggio PA-C 07/28/2012, 11:51 AM  Patient seen with PA, agree with the above note.  1. Severe AI: TTE shows severe AI, possibly with flail leaflet.  No definite vegetation.  The aortic valve is trileaflet.  Dissection is not seen in the ascending aorta on TTE.  He has been short of breath since cath/PCI on 12/12.  He became acutely dyspneic during the procedure.  He was never told he had a murmur until followup with Dr. Kirke Corin  in 1/14.  He had never had an echo prior to this morning.  Cannot rule out endocarditis but seems less likely.  Possibly the AI is traumatic related to the cath (flail leaflet).  Afebrile, awaiting WBCs.  - TEE this afternoon to determine etiology.  - He will need surgery on the valve => it may be possible to repair the valve.  Will need evaluation by CVTS.   - Blood cultures x 2 2. Dyspnea: Suspect due to severe AI with CHF/pulmonary edema.  JVP is mildly elevated.  He is not dyspneic at rest but is dyspneic with moderate exertion (this is new for him).  - Increase Lasix to 40 mg po bid.  - CXR, check BNP - Decrease Toprol XL to 25 mg daily and start afterload reduction with lisinopril 5 mg daily.  3. CAD: s/p BMS to LCX on 12/12.  Has almost been 1 month on Effient.  Will hold Effient given need for surgery.  4. ID: Patient is afebrile.  Cannot rule out endocarditis but seems less likely.  He has been on levofloxacin for cough/acute bronchitis.  Will get blood cultures today.  Hold antibiotics pending TEE.   Marca Ancona 07/28/2012 12:56 PM

## 2012-07-29 ENCOUNTER — Encounter (HOSPITAL_COMMUNITY): Payer: Self-pay | Admitting: Cardiovascular Disease

## 2012-07-29 ENCOUNTER — Inpatient Hospital Stay (HOSPITAL_COMMUNITY): Payer: BC Managed Care – PPO

## 2012-07-29 DIAGNOSIS — R079 Chest pain, unspecified: Secondary | ICD-10-CM

## 2012-07-29 LAB — GLUCOSE, CAPILLARY: Glucose-Capillary: 166 mg/dL — ABNORMAL HIGH (ref 70–99)

## 2012-07-29 MED ORDER — ALBUTEROL SULFATE (5 MG/ML) 0.5% IN NEBU
2.5000 mg | INHALATION_SOLUTION | Freq: Once | RESPIRATORY_TRACT | Status: AC
  Administered 2012-07-29: 2.5 mg via RESPIRATORY_TRACT

## 2012-07-29 NOTE — Progress Notes (Signed)
Nutrition Brief Note  Patient identified on the Malnutrition Screening Tool (MST) Report for eating poorly because of a decreased appetite.  Patient not in room upon RD visitation.  Per flowsheet records, consuming approximately 100% of meals at this time.  Body mass index is 28.78 kg/(m^2). Patient meets criteria for Overweight based on current BMI.   Current diet order is Heart Healthy.  Labs and medications reviewed.   No nutrition interventions warranted at this time.  Please consult RD as needed.  Maureen Chatters, RD, LDN Pager #: [phone] After-Hours Pager #: [phone]

## 2012-07-29 NOTE — Progress Notes (Signed)
PROGRESS NOTE  Subjective:   Pt feels better today.  Did not sleep last night.  Objective:    Vital Signs:   Temp:  [97.8 F (36.6 C)-98.6 F (37 C)] 98.4 F (36.9 C) (01/09 0549) Pulse Rate:  [75-79] 75  (01/09 0549) Resp:  [12-31] 18  (01/09 0549) BP: (88-110)/(38-57) 103/49 mmHg (01/09 0549) SpO2:  [91 %-100 %] 93 % (01/09 0549) Weight:  [194 lb 0.1 oz (88 kg)-194 lb 14.2 oz (88.4 kg)] 194 lb 14.2 oz (88.4 kg) (01/09 0549)  Last BM Date: 07/28/12   24-hour weight change: Weight change:   Weight trends: Filed Weights   07/28/12 1100 07/29/12 0549  Weight: 194 lb 0.1 oz (88 kg) 194 lb 14.2 oz (88.4 kg)    Intake/Output:  01/08 0701 - 01/09 0700 In: 543 [P.O.:240; I.V.:303] Out: -      Physical Exam: BP 103/49  Pulse 75  Temp 98.4 F (36.9 C) (Oral)  Resp 18  Ht 5\' 9"  (1.753 m)  Wt 194 lb 14.2 oz (88.4 kg)  BMI 28.78 kg/m2  SpO2 93%  General: Vital signs reviewed and noted.   Head: Normocephalic, atraumatic.  Eyes: conjunctivae/corneas clear.  EOM's intact.   Throat: normal  Neck:  normal  Lungs:    clear  Heart:  RR, 2/6 diastolic murmur with a soft systolic murmur  Abdomen:  Soft, non-tender, non-distended    Extremities: No edema, no splinter hemorrhages    Neurologic: A&O X3, CN II - XII are grossly intact.   Psych: Normal     Labs: BMET:  Basename 07/28/12 1430  NA 138  K 4.1  CL 101  CO2 26  GLUCOSE 119*  BUN 21  CREATININE 1.10  CALCIUM 9.4  MG --  PHOS --    Liver function tests:  Basename 07/28/12 1430  AST 17  ALT 14  ALKPHOS 61  BILITOT 0.5  PROT 7.0  ALBUMIN 3.8   No results found for this basename: LIPASE:2,AMYLASE:2 in the last 72 hours  CBC:  Basename 07/28/12 1430  WBC 5.0  NEUTROABS 2.5  HGB 12.3*  HCT 36.1*  MCV 87.0  PLT 96*    Cardiac Enzymes: No results found for this basename: CKTOTAL:4,CKMB:4,TROPONINI:4 in the last 72 hours  Coagulation Studies: No results found for this basename:  LABPROT:5,INR:5 in the last 72 hours   Tele:  NSR  Medications:    Infusions:    . sodium chloride 10 mL/hr (07/28/12 1417)    Scheduled Medications:    . aspirin EC  81 mg Oral Daily  . atorvastatin  40 mg Oral q1800  . enoxaparin (LOVENOX) injection  40 mg Subcutaneous QHS  . furosemide  40 mg Oral BID  . insulin aspart  0-15 Units Subcutaneous TID WC  . linagliptin  5 mg Oral Daily  . lisinopril  5 mg Oral Daily  . metFORMIN  1,000 mg Oral Q breakfast  . metoprolol succinate  25 mg Oral Daily  . sodium chloride  3 mL Intravenous Q12H  . vancomycin  1,250 mg Intravenous Q12H    Assessment/ Plan:    1. Acute aortic insufficiency:  He has either a flail noncoronary cusp or a vegetation in the NCC.  He has had some fevers over the past month - possible chills.  WBC is OK.  Blood cultures are pending.  He has been on some Abx so these may not be accurate.  Either way, I think he needs a AVR .  Dr. Dorris Fetch has mentioned the possibility of repair.   Surgery is scheduled for Monday.  We will review the cath films from [facility].   They should be in the cath lab today ( see Daun Peacock in cath lab)   Disposition: continue to observe.  Length of Stay: 1  Vesta Mixer, Montez Hageman., MD, [facility] 07/29/2012, 7:56 AM Office [phone] Pager [phone]

## 2012-07-29 NOTE — Progress Notes (Signed)
INFECTIOUS DISEASE PROGRESS NOTE  ID: Roy [name] is a 52 y.o. male with   Active Problems:  * No active hospital problems. *   Subjective: Not in room  Abtx:  Anti-infectives     Start     Dose/Rate Route Frequency Ordered Stop   07/28/12 1800   vancomycin (VANCOCIN) 1,250 mg in sodium chloride 0.9 % 250 mL IVPB        1,250 mg 166.7 mL/hr over 90 Minutes Intravenous Every 12 hours 07/28/12 1631            Medications:  Scheduled:   . aspirin EC  81 mg Oral Daily  . atorvastatin  40 mg Oral q1800  . enoxaparin (LOVENOX) injection  40 mg Subcutaneous QHS  . furosemide  40 mg Oral BID  . insulin aspart  0-15 Units Subcutaneous TID WC  . linagliptin  5 mg Oral Daily  . lisinopril  5 mg Oral Daily  . metFORMIN  1,000 mg Oral Q breakfast  . metoprolol succinate  25 mg Oral Daily  . sodium chloride  3 mL Intravenous Q12H  . vancomycin  1,250 mg Intravenous Q12H    Objective: Vital signs in last 24 hours: Temp:  [97.8 F (36.6 C)-98.6 F (37 C)] 98.4 F (36.9 C) (01/09 0549) Pulse Rate:  [75-79] 75  (01/09 0549) Resp:  [12-31] 18  (01/09 0549) BP: (88-110)/(38-57) 103/49 mmHg (01/09 0549) SpO2:  [91 %-100 %] 93 % (01/09 0549) Weight:  [88 kg (194 lb 0.1 oz)-88.4 kg (194 lb 14.2 oz)] 88.4 kg (194 lb 14.2 oz) (01/09 0549)   not in room  Lab Results  Basename 07/28/12 1430  WBC 5.0  HGB 12.3*  HCT 36.1*  NA 138  K 4.1  CL 101  CO2 26  BUN 21  CREATININE 1.10  GLU --   Liver Panel  Basename 07/28/12 1430  PROT 7.0  ALBUMIN 3.8  AST 17  ALT 14  ALKPHOS 61  BILITOT 0.5  BILIDIR --  IBILI --   Sedimentation Rate No results found for this basename: ESRSEDRATE in the last 72 hours C-Reactive Protein No results found for this basename: CRP:2 in the last 72 hours  Microbiology: Recent Results (from the past 240 hour(s))  CULTURE, BLOOD (ROUTINE X 2)     Status: Normal (Preliminary result)   Collection Time   07/28/12  2:15 PM      Component  Value Range Status Comment   Specimen Description BLOOD RIGHT ARM   Final    Special Requests BOTTLES DRAWN AEROBIC ONLY 10CC   Final    Culture  Setup Time 07/28/2012 22:33   Final    Culture     Final    Value:        BLOOD CULTURE RECEIVED NO GROWTH TO DATE CULTURE WILL BE HELD FOR 5 DAYS BEFORE ISSUING A FINAL NEGATIVE REPORT   Report Status PENDING   Incomplete   CULTURE, BLOOD (ROUTINE X 2)     Status: Normal (Preliminary result)   Collection Time   07/28/12  2:20 PM      Component Value Range Status Comment   Specimen Description BLOOD RIGHT HAND   Final    Special Requests BOTTLES DRAWN AEROBIC ONLY 10CC   Final    Culture  Setup Time 07/28/2012 22:33   Final    Culture     Final    Value:        BLOOD CULTURE RECEIVED  NO GROWTH TO DATE CULTURE WILL BE HELD FOR 5 DAYS BEFORE ISSUING A FINAL NEGATIVE REPORT   Report Status PENDING   Incomplete     Studies/Results: X-ray Chest Pa And Lateral  07/28/2012  *RADIOLOGY REPORT*  Clinical Data: Cough and shortness of breath  CHEST - 2 VIEW  Comparison: June 23, 2012  Findings:  Lungs clear.  Heart size and pulmonary vascularity are normal.  No adenopathy.  No bone lesions.  IMPRESSION: No abnormality noted.   Original Report Authenticated By: Bretta Bang, M.D.      Assessment/Plan: AI ? IE Would continue vanco for now.  Await Cx's, afeb, NL WBC Possible AVR on 08-02-12 Agree his Cx may be (-) given that he got anbx recently.   Total days of antibiotics 2 (vanco)         [name] Infectious Diseases [phone] 07/29/2012, 9:58 AM   LOS: 1 day

## 2012-07-29 NOTE — Progress Notes (Signed)
1 Day Post-Op Procedure(s) (LRB): TRANSESOPHAGEAL ECHOCARDIOGRAM (TEE) (N/A) Subjective: No complaints, stable day   Objective: Vital signs in last 24 hours: Temp:  [97.8 F (36.6 C)-98.4 F (36.9 C)] 97.8 F (36.6 C) (01/09 1416) Pulse Rate:  [75-90] 90  (01/09 1416) Cardiac Rhythm:  [-] Normal sinus rhythm (01/09 0720) Resp:  [18-20] 18  (01/09 1416) BP: (103-131)/(43-57) 131/57 mmHg (01/09 1416) SpO2:  [93 %-100 %] 98 % (01/09 1416) Weight:  [194 lb 14.2 oz (88.4 kg)] 194 lb 14.2 oz (88.4 kg) (01/09 0549)  Hemodynamic parameters for last 24 hours:    Intake/Output from previous day: 01/08 0701 - 01/09 0700 In: 543 [P.O.:240; I.V.:303] Out: -  Intake/Output this shift:    unchange  Lab Results:  Basename 07/28/12 1430  WBC 5.0  HGB 12.3*  HCT 36.1*  PLT 96*   BMET:  Basename 07/28/12 1430  NA 138  K 4.1  CL 101  CO2 26  GLUCOSE 119*  BUN 21  CREATININE 1.10  CALCIUM 9.4    PT/INR: No results found for this basename: LABPROT,INR in the last 72 hours ABG    Component Value Date/Time   TCO2 27 09/18/2010 0203   CBG (last 3)   Basename 07/29/12 1639 07/29/12 1124 07/29/12 0607  GLUCAP 171* 166* 154*    Assessment/Plan: S/P Procedure(s) (LRB): TRANSESOPHAGEAL ECHOCARDIOGRAM (TEE) (N/A) - 52 yo with acute AI  Needs AVR  D/w Dr. Excell Seltzer- since he was cathed less than a month ago and did not have any residual disease other than a nondominant RCA, we do not feel he needs to be recathed.  Plan OR Monday unless blood cultures +  He still wishes to avoid a mechanical valve and need for lifelong coumadin   LOS: 1 day    HENDRICKSON,STEVEN C 07/29/2012

## 2012-07-29 NOTE — Care Management Note (Unsigned)
    Page 1 of 1   08/05/2012     3:13:03 PM   CARE MANAGEMENT NOTE 08/05/2012  Patient:  Roy [name], Roy [name]   Account Number:  [id number]  Date Initiated:  07/29/2012  Documentation initiated by:  Roark Rufo  Subjective/Objective Assessment:   PT ADM WITH SOB, AORTIC INSUFFICIENCY.  AVR SCHEDULED FOR 08/02/12 AFTER EFFIENT WASHOUT.  PTA, PT INDEPENDENT, LIVES WITH SPOUSE.     Action/Plan:   WILL FOLLOW FOR HOME NEEDS AS PT PROGRESSES.   Anticipated DC Date:  08/07/2012   Anticipated DC Plan:  HOME W HOME HEALTH SERVICES      DC Planning Services  CM consult      Choice offered to / List presented to:             Status of service:  In process, will continue to follow Medicare Important Message given?   (If response is "NO", the following Medicare IM given date fields will be blank) Date Medicare IM given:   Date Additional Medicare IM given:    Discharge Disposition:    Per UR Regulation:  Reviewed for med. necessity/level of care/duration of stay  If discussed at Long Length of Stay Meetings, dates discussed:   08/03/2012    Comments:  Contact:  Haley,Jymmi Spouse [phone]   [phone]  08/05/12 Isauro Skelley,RN,BSN [phone] MET WITH PT TO DISCUSS DC PLANS.  PT STATES WIFE AND SON BOTH WORK, AND HE WILL NOT HAVE 24HR CARE AT DC.  HE STATES HE CANNOT ARRANGE THIS.  HE WILL NOT CONSIDER [facility] FOR REHAB.  WILL NOTIFY DR Dorris Fetch OF THIS.  08-03-12 2:30pm Avie Arenas,  RNBSN [phone] post op AVR and CABG x3.  Progressing well.  CM will continue to follow for further needs.

## 2012-07-29 NOTE — Progress Notes (Signed)
PFT completed. Unconfirmed results placed in Progress Notes of [facility] chart.

## 2012-07-30 ENCOUNTER — Encounter (HOSPITAL_COMMUNITY)
Admission: AD | Disposition: A | Discharge status: Home / Self Care | Payer: Self-pay | Source: Ambulatory Visit | Attending: Cardiology

## 2012-07-30 ENCOUNTER — Encounter (HOSPITAL_COMMUNITY): Payer: Self-pay | Admitting: Infectious Diseases

## 2012-07-30 ENCOUNTER — Inpatient Hospital Stay (HOSPITAL_COMMUNITY): Payer: BC Managed Care – PPO

## 2012-07-30 DIAGNOSIS — I517 Cardiomegaly: Secondary | ICD-10-CM

## 2012-07-30 DIAGNOSIS — I2510 Atherosclerotic heart disease of native coronary artery without angina pectoris: Secondary | ICD-10-CM

## 2012-07-30 HISTORY — PX: CARDIAC CATHETERIZATION: SHX172

## 2012-07-30 HISTORY — PX: CORONARY ANGIOGRAM: SHX5466

## 2012-07-30 LAB — GLUCOSE, CAPILLARY
Glucose-Capillary: 211 mg/dL — ABNORMAL HIGH (ref 70–99)
Glucose-Capillary: 430 mg/dL — ABNORMAL HIGH (ref 70–99)

## 2012-07-30 LAB — BASIC METABOLIC PANEL
BUN: 20 mg/dL (ref 6–23)
CO2: 25 mEq/L (ref 19–32)
Chloride: 100 mEq/L (ref 96–112)
Creatinine, Ser: 1.04 mg/dL (ref 0.50–1.35)
GFR calc Af Amer: >90 mL/min (ref >90)
Potassium: 4 mEq/L (ref 3.5–5.1)

## 2012-07-30 SURGERY — LEFT AND RIGHT HEART CATHETERIZATION WITH CORONARY ANGIOGRAM
Anesthesia: LOCAL

## 2012-07-30 SURGERY — CORONARY ANGIOGRAM

## 2012-07-30 MED ORDER — METOPROLOL TARTRATE 12.5 MG HALF TABLET: 12.5000 mg | ORAL_TABLET | Freq: Once | ORAL | Status: DC

## 2012-07-30 MED ORDER — DIPHENHYDRAMINE HCL 50 MG/ML IJ SOLN
25.0000 mg | Freq: Once | INTRAMUSCULAR | Status: AC
  Administered 2012-07-30: 25 mg via INTRAVENOUS
  Filled 2012-07-30: qty 1

## 2012-07-30 MED ORDER — VERAPAMIL HCL 2.5 MG/ML IV SOLN
INTRAVENOUS | Status: AC
  Filled 2012-07-30: qty 2

## 2012-07-30 MED ORDER — TEMAZEPAM 15 MG PO CAPS: 15.0000 mg | ORAL_CAPSULE | Freq: Once | ORAL | Status: DC | PRN

## 2012-07-30 MED ORDER — HEPARIN (PORCINE) IN NACL 2-0.9 UNIT/ML-% IJ SOLN
INTRAMUSCULAR | Status: AC
  Filled 2012-07-30: qty 1000

## 2012-07-30 MED ORDER — DIAZEPAM 5 MG PO TABS
5.0000 mg | ORAL_TABLET | Freq: Once | ORAL | Status: AC
  Administered 2012-08-02: 5 mg via ORAL
  Filled 2012-07-30: qty 1

## 2012-07-30 MED ORDER — FAMOTIDINE IN NACL 20-0.9 MG/50ML-% IV SOLN
20.0000 mg | Freq: Once | INTRAVENOUS | Status: AC
  Administered 2012-07-30: 20 mg via INTRAVENOUS
  Filled 2012-07-30: qty 50

## 2012-07-30 MED ORDER — ONDANSETRON HCL 4 MG/2ML IJ SOLN: 4.0000 mg | Freq: Four times a day (QID) | INTRAMUSCULAR | Status: DC | PRN

## 2012-07-30 MED ORDER — METOPROLOL TARTRATE 1 MG/ML IV SOLN
2.5000 mg | Freq: Once | INTRAVENOUS | Status: AC
  Administered 2012-07-30: 2.5 mg via INTRAVENOUS

## 2012-07-30 MED ORDER — MIDAZOLAM HCL 2 MG/2ML IJ SOLN
INTRAMUSCULAR | Status: AC
  Filled 2012-07-30: qty 2

## 2012-07-30 MED ORDER — ACETAMINOPHEN 325 MG PO TABS: 650.0000 mg | ORAL_TABLET | ORAL | Status: DC | PRN

## 2012-07-30 MED ORDER — SODIUM CHLORIDE 0.9 % IJ SOLN: 3.0000 mL | INTRAMUSCULAR | Status: DC | PRN

## 2012-07-30 MED ORDER — SODIUM CHLORIDE 0.9 % IV SOLN
INTRAVENOUS | Status: AC
  Administered 2012-07-30: 75 mL/h via INTRAVENOUS

## 2012-07-30 MED ORDER — EPTIFIBATIDE BOLUS VIA INFUSION
180.0000 ug/kg | Freq: Once | INTRAVENOUS | Status: AC
  Administered 2012-07-30: 15600 ug via INTRAVENOUS
  Filled 2012-07-30: qty 21

## 2012-07-30 MED ORDER — ALPRAZOLAM 0.25 MG PO TABS: 0.2500 mg | ORAL_TABLET | ORAL | Status: DC | PRN

## 2012-07-30 MED ORDER — CHLORHEXIDINE GLUCONATE 4 % EX LIQD
60.0000 mL | Freq: Once | CUTANEOUS | Status: AC
  Administered 2012-07-31: 4 via TOPICAL
  Filled 2012-07-30: qty 60

## 2012-07-30 MED ORDER — ASPIRIN 81 MG PO CHEW
324.0000 mg | CHEWABLE_TABLET | ORAL | Status: AC
  Administered 2012-07-30: 324 mg via ORAL
  Filled 2012-07-30: qty 4

## 2012-07-30 MED ORDER — SODIUM CHLORIDE 0.9 % IJ SOLN: 3.0000 mL | Freq: Two times a day (BID) | INTRAMUSCULAR | Status: DC

## 2012-07-30 MED ORDER — SODIUM CHLORIDE 0.9 % IV SOLN
1.0000 mL/kg/h | INTRAVENOUS | Status: DC
  Administered 2012-07-30: 1 mL/kg/h via INTRAVENOUS

## 2012-07-30 MED ORDER — METOPROLOL TARTRATE 12.5 MG HALF TABLET
12.5000 mg | ORAL_TABLET | Freq: Once | ORAL | Status: AC
  Administered 2012-08-02: 12.5 mg via ORAL
  Filled 2012-07-30: qty 1

## 2012-07-30 MED ORDER — BISACODYL 5 MG PO TBEC
5.0000 mg | DELAYED_RELEASE_TABLET | Freq: Once | ORAL | Status: AC
  Administered 2012-08-01: 5 mg via ORAL
  Filled 2012-07-30: qty 1

## 2012-07-30 MED ORDER — METOPROLOL TARTRATE 1 MG/ML IV SOLN
INTRAVENOUS | Status: AC
  Filled 2012-07-30: qty 5

## 2012-07-30 MED ORDER — IOHEXOL 350 MG/ML SOLN
100.0000 mL | Freq: Once | INTRAVENOUS | Status: AC | PRN
  Administered 2012-07-30: 100 mL via INTRAVENOUS

## 2012-07-30 MED ORDER — HEPARIN SODIUM (PORCINE) 1000 UNIT/ML IJ SOLN
INTRAMUSCULAR | Status: AC
  Filled 2012-07-30: qty 1

## 2012-07-30 MED ORDER — SODIUM CHLORIDE 0.9 % IV SOLN: 250.0000 mL | INTRAVENOUS | Status: DC | PRN

## 2012-07-30 MED ORDER — EPTIFIBATIDE 75 MG/100ML IV SOLN
2.0000 ug/kg/min | INTRAVENOUS | Status: DC
  Administered 2012-07-30 – 2012-08-01 (×7): 2 ug/kg/min via INTRAVENOUS
  Filled 2012-07-30 (×14): qty 100

## 2012-07-30 MED ORDER — TEMAZEPAM 15 MG PO CAPS
15.0000 mg | ORAL_CAPSULE | Freq: Once | ORAL | Status: AC | PRN
  Administered 2012-08-01: 15 mg via ORAL
  Filled 2012-07-30: qty 1

## 2012-07-30 MED ORDER — INSULIN ASPART 100 UNIT/ML ~~LOC~~ SOLN: 0.0000 [IU] | Freq: Three times a day (TID) | SUBCUTANEOUS | Status: DC

## 2012-07-30 MED ORDER — INSULIN ASPART 100 UNIT/ML ~~LOC~~ SOLN: 0.0000 [IU] | Freq: Every day | SUBCUTANEOUS | Status: DC

## 2012-07-30 MED ORDER — BISACODYL 5 MG PO TBEC: 5.0000 mg | DELAYED_RELEASE_TABLET | Freq: Once | ORAL | Status: DC

## 2012-07-30 MED ORDER — NITROGLYCERIN 0.2 MG/ML ON CALL CATH LAB
INTRAVENOUS | Status: AC
  Filled 2012-07-30: qty 1

## 2012-07-30 MED ORDER — METHYLPREDNISOLONE SODIUM SUCC 125 MG IJ SOLR
125.0000 mg | Freq: Once | INTRAMUSCULAR | Status: AC
  Administered 2012-07-30: 125 mg via INTRAVENOUS
  Filled 2012-07-30 (×2): qty 2

## 2012-07-30 MED ORDER — LIDOCAINE HCL (PF) 1 % IJ SOLN
INTRAMUSCULAR | Status: AC
  Filled 2012-07-30: qty 30

## 2012-07-30 NOTE — Progress Notes (Signed)
Moved to CCU after cath.  Denies CP or SOB  BP 89/51  Pulse 96  Temp 98.5 F (36.9 C) (Oral)  Resp 19  Ht 5\' 9"  (1.753 m)  Wt 191 lb (86.637 kg)  BMI 28.21 kg/m2  SpO2 94%   Intake/Output Summary (Last 24 hours) at 07/30/12 1759 Last data filed at 07/30/12 1500  Gross per 24 hour  Intake   1020 ml  Output    325 ml  Net    695 ml    Results for orders placed during the hospital encounter of 07/28/12  CULTURE, BLOOD (ROUTINE X 2)     Status: Normal (Preliminary result)   Collection Time   07/28/12  2:15 PM      Component Value Range Status Comment   Specimen Description BLOOD RIGHT ARM   Final    Special Requests BOTTLES DRAWN AEROBIC ONLY 10CC   Final    Culture  Setup Time 07/28/2012 22:33   Final    Culture     Final    Value:        BLOOD CULTURE RECEIVED NO GROWTH TO DATE CULTURE WILL BE HELD FOR 5 DAYS BEFORE ISSUING A FINAL NEGATIVE REPORT   Report Status PENDING   Incomplete   CULTURE, BLOOD (ROUTINE X 2)     Status: Normal (Preliminary result)   Collection Time   07/28/12  2:20 PM      Component Value Range Status Comment   Specimen Description BLOOD RIGHT HAND   Final    Special Requests BOTTLES DRAWN AEROBIC ONLY 10CC   Final    Culture  Setup Time 07/28/2012 22:33   Final    Culture     Final    Value:        BLOOD CULTURE RECEIVED NO GROWTH TO DATE CULTURE WILL BE HELD FOR 5 DAYS BEFORE ISSUING A FINAL NEGATIVE REPORT   Report Status PENDING   Incomplete   CULTURE, BLOOD (ROUTINE X 2)     Status: Normal (Preliminary result)   Collection Time   07/28/12  6:32 PM      Component Value Range Status Comment   Specimen Description BLOOD RIGHT HAND   Final    Special Requests BOTTLES DRAWN AEROBIC ONLY 10CC   Final    Culture  Setup Time 07/29/2012 00:47   Final    Culture     Final    Value:        BLOOD CULTURE RECEIVED NO GROWTH TO DATE CULTURE WILL BE HELD FOR 5 DAYS BEFORE ISSUING A FINAL NEGATIVE REPORT   Report Status PENDING   Incomplete   MRSA PCR  SCREENING     Status: Normal   Collection Time   07/30/12  2:15 PM      Component Value Range Status Comment   MRSA by PCR NEGATIVE  NEGATIVE Final    Cardiac cath showed a filling defect in proximal circumflex extending back into distal left main  Cardiac CT Findings: There was no evidence of sinus of valsalva aneurysm, or  aortic dissection  Sinus of Valsalva: 3.2 cm  Ascending Aorta: 3.2 cm  Arch: 2.4 cm  Descending thoracic aorta: 2.2 cm  The great vessels arose from the arch normally with no coarctation.  The Aortic valve appeared tri-leaflet. Although not well seen  there appeared to be a central area of malcoaptation between the  noncoronary cusp and right coronary cusp. No obvious vegetation  was seen.  The main pulmonary  artery was mildly dilated at 3.0 cm  There was no pericardial effusion.  Coronary arteries: Left dominant with no anomalies  LM- calcified especially distally near take off of circumflex with  no obvious dissection  LAD- patent with non obstructive calcific disease with distal  vessel poorly visualized  Diagonals: 2 small diagonals visualize without critical  disease  Circumflex- dominant and patent but with large stent in mid vessel  making analysis impossible  OM1: Patent with stent  RCA: nondominant and small with likely obstructive disease in mid  vessel  See separate report from [facility] for interpretation of  limited soft tissue and lung windows  Impression:  1) No aortic dissection Ascending aorta 3.2 cm see other  measurements above  2) Trileaflet AV with central malcoaptation and no vegetation  seen on poor quality study  3) No LM dissection appreciated with dense area of calcification  at circumflex take off  4) Patent stent to mid left dominant circumflex artery  5) No pericardial effusion  6) Mildly dilated main pulmonary artery    IMPRESSION/PLAN  52 yo with severe AI, ? Endocarditis v mechanical trauma to valve. In  any event his aortic insufficiency needs to be corrected. Blood cultures negative to date. We plan to proceed with aortic valve replacement on Monday 1/13. I will assess whether the valve might be repairable, but based on echo findings it is highly unlikely.  Cath today showed a filling defect in the proximal circumflex extending back into the distal left main. In reviewing his cath from December, it did appear he may have had a dissection at the origin of the LCx. I'm not sure what to make of the findings of the CT, but I do feel that he should have CABG at the time of AVR.  I discussed these issues at length with Mr. and Mrs. Roy [name]. They are fully aware of all of the issues and the risks and benefits of the planned procedure and wish to proceed. For OR Monday

## 2012-07-30 NOTE — Progress Notes (Signed)
 PROGRESS NOTE  Subjective:   Pt feels better today.    Objective:    Vital Signs:   Temp:  [97.8 F (36.6 C)-98.5 F (36.9 C)] 98.5 F (36.9 C) (01/10 0619) Pulse Rate:  [77-90] 77  (01/10 0619) Resp:  [17-18] 18  (01/10 0619) BP: (90-131)/(34-57) 104/50 mmHg (01/10 0657) SpO2:  [94 %-98 %] 96 % (01/10 0619) Weight:  [191 lb (86.637 kg)] 191 lb (86.637 kg) (01/10 0604)  Last BM Date: 07/28/12   24-hour weight change: Weight change: -3 lb 0.1 oz (-1.363 kg)  Weight trends: Filed Weights   07/28/12 1100 07/29/12 0549 07/30/12 0604  Weight: 194 lb 0.1 oz (88 kg) 194 lb 14.2 oz (88.4 kg) 191 lb (86.637 kg)    Intake/Output:  01/09 0701 - 01/10 0700 In: 360 [P.O.:360] Out: -      Physical Exam: BP 104/50  Pulse 77  Temp 98.5 F (36.9 C) (Oral)  Resp 18  Ht 5' 9" (1.753 m)  Wt 191 lb (86.637 kg)  BMI 28.21 kg/m2  SpO2 96%  General: Vital signs reviewed and noted.   Head: Normocephalic, atraumatic.  Eyes: conjunctivae/corneas clear.  EOM's intact.   Throat: normal  Neck:  normal  Lungs:    clear  Heart:  RR, 2/6 diastolic murmur with a soft systolic murmur  Abdomen:  Soft, non-tender, non-distended    Extremities: No edema, no splinter hemorrhages    Neurologic: A&O X3, CN II - XII are grossly intact.   Psych: Normal     Labs: BMET:  Basename 07/28/12 1430  NA 138  K 4.1  CL 101  CO2 26  GLUCOSE 119*  BUN 21  CREATININE 1.10  CALCIUM 9.4  MG --  PHOS --    Liver function tests:  Basename 07/28/12 1430  AST 17  ALT 14  ALKPHOS 61  BILITOT 0.5  PROT 7.0  ALBUMIN 3.8   No results found for this basename: LIPASE:2,AMYLASE:2 in the last 72 hours  CBC:  Basename 07/28/12 1430  WBC 5.0  NEUTROABS 2.5  HGB 12.3*  HCT 36.1*  MCV 87.0  PLT 96*    Cardiac Enzymes: No results found for this basename: CKTOTAL:4,CKMB:4,TROPONINI:4 in the last 72 hours  Coagulation Studies: No results found for this basename: LABPROT:5,INR:5 in the  last 72 hours   Tele:  NSR  Medications:    Infusions:    Scheduled Medications:    . aspirin EC  81 mg Oral Daily  . atorvastatin  40 mg Oral q1800  . enoxaparin (LOVENOX) injection  40 mg Subcutaneous QHS  . furosemide  40 mg Oral BID  . insulin aspart  0-15 Units Subcutaneous TID WC  . linagliptin  5 mg Oral Daily  . lisinopril  5 mg Oral Daily  . metFORMIN  1,000 mg Oral Q breakfast  . metoprolol succinate  25 mg Oral Daily  . sodium chloride  3 mL Intravenous Q12H  . vancomycin  1,250 mg Intravenous Q12H    Assessment/ Plan:    1. Acute aortic insufficiency:  He has either a flail noncoronary cusp or a vegetation in the NCC.  He has had some fevers over the past month - possible chills.  WBC is OK.  Blood cultures are pending.  He has been on some Abx so these may not be accurate.  I have reviewed the angiograms from a month ago.  The 2 LCx stents looked fine at the completion of the case.  I   have some concern about a filling defect in the proximal LCx.  It could be just dye streaming.  I would favor limited cath to take a 2nd look at the left system today prior to his AVR on Monday.  Disposition: cath this afternoon.  He ate breakfast.  Length of Stay: 2  Lashe Oliveira J. Danicka Hourihan, Jr., MD, FACC 07/30/2012, 7:16 AM Office [phone] Pager [phone]    

## 2012-07-30 NOTE — CV Procedure (Signed)
  Cardiac Catheterization Procedure Note  Name: Roy [name] MRN: [id number] DOB: 04-01-1961  Procedure: Left Heart Cath, Selective Coronary Angiography, LV angiography  Indication:    Procedural details: The right groin was prepped, draped, and anesthetized with 1% lidocaine. Using modified Seldinger technique, a 5 French sheath was introduced into the right femoral artery. Standard Judkins catheters were used for coronary angiography and left ventriculography. Catheter exchanges were performed over a guidewire. There were no immediate procedural complications. The patient was transferred to the post catheterization recovery area for further monitoring.  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  Left circumflex (LCx):  Ostial and proximal circ dissection without obstruction to flow.  Mid AV groove stent widely patent.  OM moderate sized with patent ostial stent.  40% stenosis after the stent.  Large branching vessel with mild diffuse luminal irregularities. PDA moderate sized and normal.  PL small and normal.  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  This does not appear to be flow limiting and the patient is not currently having symptoms.  I reviewed the previous cath films at the time of the intervention in December and there was a filling defect with likely dissection at that time.    Recommendations:  The patient will have a cardiac CT to evaluate the aortic root and to further define the coronary dissection.  Results discussed with Drs Kirke Corin and Dorris Fetch.     [name] 07/30/2012, 1:09 PM

## 2012-07-30 NOTE — Progress Notes (Signed)
Pt's continuous pulse ox alarmed around 0000 after pt fell asleep. Sats dropped into the high 80's and returned to mid 90's after pt was awakened. Pt asymptomatic with no complaints or concerns. Will continue to monitor pt closely.

## 2012-07-30 NOTE — Progress Notes (Addendum)
ANTIBIOTIC CONSULT NOTE  Pharmacy Consult for vancomycin Indication: endocarditis  Allergies  Allergen Reactions  . Contrast Media (Iodinated Diagnostic Agents) Other (See Comments)    Pt'shad streaks up and down his arms    Patient Measurements: Height: 5\' 9"  (175.3 cm) Weight: 191 lb (86.637 kg) IBW/kg (Calculated) : 70.7    Vital Signs: Temp: 98.5 F (36.9 C) (01/10 0619) Temp src: Oral (01/10 0619) BP: 89/51 mmHg (01/10 1730) Pulse Rate: 96  (01/10 1730) Intake/Output from previous day: 01/09 0701 - 01/10 0700 In: 360 [P.O.:360] Out: -  Intake/Output from this shift: Total I/O In: 980 [P.O.:360; I.V.:620] Out: 325 [Urine:325]  Labs:  [facility] 07/30/12 1514 07/28/12 1430  WBC -- 5.0  HGB -- 12.3*  PLT -- 96*  LABCREA -- --  CREATININE 1.04 1.10   Estimated Creatinine Clearance: 92.7 ml/min (by C-G formula based on Cr of 1.04).  Basename 07/30/12 1714  VANCOTROUGH 19.8  VANCOPEAK --  VANCORANDOM --  GENTTROUGH --  GENTPEAK --  GENTRANDOM --  TOBRATROUGH --  TOBRAPEAK --  TOBRARND --  AMIKACINPEAK --  AMIKACINTROU --  AMIKACIN --     Microbiology: Recent Results (from the past 720 hour(s))  CULTURE, BLOOD (ROUTINE X 2)     Status: Normal (Preliminary result)   Collection Time   07/28/12  2:15 PM      Component Value Range Status Comment   Specimen Description BLOOD RIGHT ARM   Final    Special Requests BOTTLES DRAWN AEROBIC ONLY 10CC   Final    Culture  Setup Time 07/28/2012 22:33   Final    Culture     Final    Value:        BLOOD CULTURE RECEIVED NO GROWTH TO DATE CULTURE WILL BE HELD FOR 5 DAYS BEFORE ISSUING A FINAL NEGATIVE REPORT   Report Status PENDING   Incomplete   CULTURE, BLOOD (ROUTINE X 2)     Status: Normal (Preliminary result)   Collection Time   07/28/12  2:20 PM      Component Value Range Status Comment   Specimen Description BLOOD RIGHT HAND   Final    Special Requests BOTTLES DRAWN AEROBIC ONLY 10CC   Final    Culture  Setup  Time 07/28/2012 22:33   Final    Culture     Final    Value:        BLOOD CULTURE RECEIVED NO GROWTH TO DATE CULTURE WILL BE HELD FOR 5 DAYS BEFORE ISSUING A FINAL NEGATIVE REPORT   Report Status PENDING   Incomplete   CULTURE, BLOOD (ROUTINE X 2)     Status: Normal (Preliminary result)   Collection Time   07/28/12  6:32 PM      Component Value Range Status Comment   Specimen Description BLOOD RIGHT HAND   Final    Special Requests BOTTLES DRAWN AEROBIC ONLY 10CC   Final    Culture  Setup Time 07/29/2012 00:47   Final    Culture     Final    Value:        BLOOD CULTURE RECEIVED NO GROWTH TO DATE CULTURE WILL BE HELD FOR 5 DAYS BEFORE ISSUING A FINAL NEGATIVE REPORT   Report Status PENDING   Incomplete   MRSA PCR SCREENING     Status: Normal   Collection Time   07/30/12  2:15 PM      Component Value Range Status Comment   MRSA by PCR NEGATIVE  NEGATIVE Final  Medical History: Past Medical History  Diagnosis Date  . 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 Diabetes mellitus     type II  . Hyperlipidemia   . Hypertension   . Melanoma   . Heart attack 12-2011    [facility]  . Shortness of breath   . Aortic insufficiency     Medications:  Prescriptions prior to admission  Medication Sig Dispense Refill  . albuterol (PROVENTIL HFA;VENTOLIN HFA) 108 (90 BASE) MCG/ACT inhaler Inhale 2 puffs into the lungs every 4 (four) hours as needed for wheezing.  1 Inhaler  3  . aspirin 81 MG tablet Take 81 mg by mouth daily.      . furosemide (LASIX) 20 MG tablet Take 1 tablet (20 mg total) by mouth 2 (two) times daily.  60 tablet  11  . metoprolol succinate (TOPROL XL) 25 MG 24 hr tablet Take 2 tablets (50 mg total) by mouth daily.  60 tablet  3  . NITROSTAT 0.4 MG SL tablet Place 0.4 mg under the tongue every 5 (five) minutes as needed.       . pioglitazone (ACTOS) 15 MG tablet Take 15 mg by mouth daily.      . prasugrel (EFFIENT) 10  MG TABS Take 1 tablet (10 mg total) by mouth daily.  90 tablet  3  . rosuvastatin (CRESTOR) 20 MG tablet Take 1 tablet (20 mg total) by mouth daily.  90 tablet  3  . sitaGLIPtan-metformin (JANUMET) 50-1000 MG per tablet Take 1 tablet by mouth daily.        Assessment: 52 year old man on vancomycin for endocarditis.  Plan for surgery on Monday.  Vanc trough therapeutic.  Goal of Therapy:  Vancomycin trough level 15-20 mcg/ml  Plan:  Cont vancomycin 1250mg  IV q12 Monitor renal function  Roy [name] 07/30/2012,6:13 PM   Addum:  Wish to start integrelin for antiplatelet therapy in pt with recent stent and ? Thrombus.  Will give 180 mcg/kg load then 2 mcg/kg/min infusion. F/u CBC.

## 2012-07-30 NOTE — Progress Notes (Signed)
INFECTIOUS DISEASE PROGRESS NOTE  ID: Roy [name] is a 52 y.o. male with  Active Problems:  * No active hospital problems. *   Subjective: Still some SOB.   Abtx:  Anti-infectives     Start     Dose/Rate Route Frequency Ordered Stop   07/28/12 1800   vancomycin (VANCOCIN) 1,250 mg in sodium chloride 0.9 % 250 mL IVPB        1,250 mg 166.7 mL/hr over 90 Minutes Intravenous Every 12 hours 07/28/12 1631            Medications:  Scheduled:   . aspirin  324 mg Oral Pre-Cath  . aspirin EC  81 mg Oral Daily  . atorvastatin  40 mg Oral q1800  . diphenhydrAMINE  25 mg Intravenous Once  . enoxaparin (LOVENOX) injection  40 mg Subcutaneous QHS  . famotidine  20 mg Intravenous Once  . furosemide  40 mg Oral BID  . insulin aspart  0-15 Units Subcutaneous TID WC  . linagliptin  5 mg Oral Daily  . lisinopril  5 mg Oral Daily  . metFORMIN  1,000 mg Oral Q breakfast  . methylPREDNISolone sodium succinate  125 mg Intravenous Once  . metoprolol succinate  25 mg Oral Daily  . sodium chloride  3 mL Intravenous Q12H  . sodium chloride  3 mL Intravenous Q12H  . vancomycin  1,250 mg Intravenous Q12H    Objective: Vital signs in last 24 hours: Temp:  [97.8 F (36.6 C)-98.5 F (36.9 C)] 98.5 F (36.9 C) (01/10 1610) Pulse Rate:  [77-90] 77  (01/10 0619) Resp:  [17-18] 18  (01/10 0619) BP: (90-131)/(34-57) 104/50 mmHg (01/10 0657) SpO2:  [94 %-98 %] 96 % (01/10 0619) Weight:  [86.637 kg (191 lb)] 86.637 kg (191 lb) (01/10 0604)   General appearance: alert, cooperative and no distress Resp: clear to auscultation bilaterally Cardio: regular rate and rhythm and murmur unchanged GI: normal findings: bowel sounds normal and soft, non-tender  Lab Results  Basename 07/28/12 1430  WBC 5.0  HGB 12.3*  HCT 36.1*  NA 138  K 4.1  CL 101  CO2 26  BUN 21  CREATININE 1.10  GLU --   Liver Panel  Basename 07/28/12 1430  PROT 7.0  ALBUMIN 3.8  AST 17  ALT 14  ALKPHOS 61    BILITOT 0.5  BILIDIR --  IBILI --   Sedimentation Rate No results found for this basename: ESRSEDRATE in the last 72 hours C-Reactive Protein No results found for this basename: CRP:2 in the last 72 hours  Microbiology: Recent Results (from the past 240 hour(s))  CULTURE, BLOOD (ROUTINE X 2)     Status: Normal (Preliminary result)   Collection Time   07/28/12  2:15 PM      Component Value Range Status Comment   Specimen Description BLOOD RIGHT ARM   Final    Special Requests BOTTLES DRAWN AEROBIC ONLY 10CC   Final    Culture  Setup Time 07/28/2012 22:33   Final    Culture     Final    Value:        BLOOD CULTURE RECEIVED NO GROWTH TO DATE CULTURE WILL BE HELD FOR 5 DAYS BEFORE ISSUING A FINAL NEGATIVE REPORT   Report Status PENDING   Incomplete   CULTURE, BLOOD (ROUTINE X 2)     Status: Normal (Preliminary result)   Collection Time   07/28/12  2:20 PM      Component Value  Range Status Comment   Specimen Description BLOOD RIGHT HAND   Final    Special Requests BOTTLES DRAWN AEROBIC ONLY 10CC   Final    Culture  Setup Time 07/28/2012 22:33   Final    Culture     Final    Value:        BLOOD CULTURE RECEIVED NO GROWTH TO DATE CULTURE WILL BE HELD FOR 5 DAYS BEFORE ISSUING A FINAL NEGATIVE REPORT   Report Status PENDING   Incomplete   CULTURE, BLOOD (ROUTINE X 2)     Status: Normal (Preliminary result)   Collection Time   07/28/12  6:32 PM      Component Value Range Status Comment   Specimen Description BLOOD RIGHT HAND   Final    Special Requests BOTTLES DRAWN AEROBIC ONLY 10CC   Final    Culture  Setup Time 07/29/2012 00:47   Final    Culture     Final    Value:        BLOOD CULTURE RECEIVED NO GROWTH TO DATE CULTURE WILL BE HELD FOR 5 DAYS BEFORE ISSUING A FINAL NEGATIVE REPORT   Report Status PENDING   Incomplete     Studies/Results: X-ray Chest Pa And Lateral  07/28/2012  *RADIOLOGY REPORT*  Clinical Data: Cough and shortness of breath  CHEST - 2 VIEW  Comparison: June 23, 2012  Findings:  Lungs clear.  Heart size and pulmonary vascularity are normal.  No adenopathy.  No bone lesions.  IMPRESSION: No abnormality noted.   Original Report Authenticated By: Bretta Bang, M.D.      Assessment/Plan:  AI  ? IE  Would continue vanco for now.  Cx's NGTD, afeb, NL WBC  Possible AVR on 08-02-12  Cx may be (-) given that he got anbx recently.  For cath today, will continue to follow his Cx Total days of antibiotics 3 (vanco)    [name] Infectious Diseases [phone] 07/30/2012, 10:02 AM   LOS: 2 days

## 2012-07-30 NOTE — H&P (View-Only) (Signed)
PROGRESS NOTE  Subjective:   Pt feels better today.    Objective:    Vital Signs:   Temp:  [97.8 F (36.6 C)-98.5 F (36.9 C)] 98.5 F (36.9 C) (01/10 0619) Pulse Rate:  [77-90] 77  (01/10 0619) Resp:  [17-18] 18  (01/10 0619) BP: (90-131)/(34-57) 104/50 mmHg (01/10 0657) SpO2:  [94 %-98 %] 96 % (01/10 0619) Weight:  [191 lb (86.637 kg)] 191 lb (86.637 kg) (01/10 0604)  Last BM Date: 07/28/12   24-hour weight change: Weight change: -3 lb 0.1 oz (-1.363 kg)  Weight trends: Filed Weights   07/28/12 1100 07/29/12 0549 07/30/12 0604  Weight: 194 lb 0.1 oz (88 kg) 194 lb 14.2 oz (88.4 kg) 191 lb (86.637 kg)    Intake/Output:  01/09 0701 - 01/10 0700 In: 360 [P.O.:360] Out: -      Physical Exam: BP 104/50  Pulse 77  Temp 98.5 F (36.9 C) (Oral)  Resp 18  Ht 5\' 9"  (1.753 m)  Wt 191 lb (86.637 kg)  BMI 28.21 kg/m2  SpO2 96%  General: Vital signs reviewed and noted.   Head: Normocephalic, atraumatic.  Eyes: conjunctivae/corneas clear.  EOM's intact.   Throat: normal  Neck:  normal  Lungs:    clear  Heart:  RR, 2/6 diastolic murmur with a soft systolic murmur  Abdomen:  Soft, non-tender, non-distended    Extremities: No edema, no splinter hemorrhages    Neurologic: A&O X3, CN II - XII are grossly intact.   Psych: Normal     Labs: BMET:  Basename 07/28/12 1430  NA 138  K 4.1  CL 101  CO2 26  GLUCOSE 119*  BUN 21  CREATININE 1.10  CALCIUM 9.4  MG --  PHOS --    Liver function tests:  Basename 07/28/12 1430  AST 17  ALT 14  ALKPHOS 61  BILITOT 0.5  PROT 7.0  ALBUMIN 3.8   No results found for this basename: LIPASE:2,AMYLASE:2 in the last 72 hours  CBC:  Basename 07/28/12 1430  WBC 5.0  NEUTROABS 2.5  HGB 12.3*  HCT 36.1*  MCV 87.0  PLT 96*    Cardiac Enzymes: No results found for this basename: CKTOTAL:4,CKMB:4,TROPONINI:4 in the last 72 hours  Coagulation Studies: No results found for this basename: LABPROT:5,INR:5 in the  last 72 hours   Tele:  NSR  Medications:    Infusions:    Scheduled Medications:    . aspirin EC  81 mg Oral Daily  . atorvastatin  40 mg Oral q1800  . enoxaparin (LOVENOX) injection  40 mg Subcutaneous QHS  . furosemide  40 mg Oral BID  . insulin aspart  0-15 Units Subcutaneous TID WC  . linagliptin  5 mg Oral Daily  . lisinopril  5 mg Oral Daily  . metFORMIN  1,000 mg Oral Q breakfast  . metoprolol succinate  25 mg Oral Daily  . sodium chloride  3 mL Intravenous Q12H  . vancomycin  1,250 mg Intravenous Q12H    Assessment/ Plan:    1. Acute aortic insufficiency:  He has either a flail noncoronary cusp or a vegetation in the NCC.  He has had some fevers over the past month - possible chills.  WBC is OK.  Blood cultures are pending.  He has been on some Abx so these may not be accurate.  I have reviewed the angiograms from a month ago.  The 2 LCx stents looked fine at the completion of the case.  I  have some concern about a filling defect in the proximal LCx.  It could be just dye streaming.  I would favor limited cath to take a 2nd look at the left system today prior to his AVR on Monday.  Disposition: cath this afternoon.  He ate breakfast.  Length of Stay: 2  Vesta Mixer, Montez Hageman., MD, [facility] 07/30/2012, 7:16 AM Office [phone] Pager [phone]

## 2012-07-30 NOTE — Progress Notes (Signed)
   PM/Post Cath Rounds  SUBJECTIVE:  No chest pain.  No SOB   PHYSICAL EXAM Filed Vitals:   07/30/12 1545 07/30/12 1730 07/30/12 1800 07/30/12 1811  BP: 100/47 89/51  100/44  Pulse: 87 96 94   Temp:      TempSrc:      Resp: 19     Height:      Weight:      SpO2: 97% 94% 96%    Lungs:  Clear Heart:  RRR Extremities:  No bleeding left groin   LABS:  Intake/Output Summary (Last 24 hours) at 07/30/12 1943 Last data filed at 07/30/12 1800  Gross per 24 hour  Intake    980 ml  Output    325 ml  Net    655 ml    ASSESSMENT AND PLAN:  CAD:  CT did not suggest dissection of the coronary arteries.  However, I think this is still likely as described in my cath.  I do not think the filling defect is related to calcium only.  I have reviewed the films with several others.  This could represent thrombus.  The patient will need bypass at the time of the AVR.  Given the recent stent and the fact that he cannot take Effient I will start Integrilin pending his AVR.    [name] 07/30/2012 7:43 PM

## 2012-07-30 NOTE — Interval H&P Note (Signed)
History and Physical Interval Note:  07/30/2012 12:02 PM  Roy [name]  has presented today for surgery, with the diagnosis of CP  The various methods of treatment have been discussed with the patient and family. After consideration of risks, benefits and other options for treatment, the patient has consented to  Procedure(s) (LRB) with comments: LEFT AND RIGHT HEART CATHETERIZATION WITH CORONARY ANGIOGRAM (N/A) as a surgical intervention .  The patient's history has been reviewed, patient examined, no change in status, stable for surgery.  I have reviewed the patient's chart and labs.  Questions were answered to the patient's satisfaction.     [name]

## 2012-07-30 NOTE — Plan of Care (Signed)
Problem: Consults Goal: Cardiac Cath Patient Education (See Patient Education module for education specifics.) Outcome: Completed/Met Date Met:  07/30/12 Pt just had cath in Dec and did not want to watch video.

## 2012-07-31 ENCOUNTER — Inpatient Hospital Stay (HOSPITAL_COMMUNITY): Payer: BC Managed Care – PPO

## 2012-07-31 LAB — GLUCOSE, CAPILLARY
Glucose-Capillary: 157 mg/dL — ABNORMAL HIGH (ref 70–99)
Glucose-Capillary: 112 mg/dL — ABNORMAL HIGH (ref 70–99)
Glucose-Capillary: 291 mg/dL — ABNORMAL HIGH (ref 70–99)
Glucose-Capillary: 180 mg/dL — ABNORMAL HIGH (ref 70–99)

## 2012-07-31 LAB — URINALYSIS, ROUTINE W REFLEX MICROSCOPIC
Bilirubin Urine: NEGATIVE
Glucose, UA: >1000 mg/dL — AB
Hgb urine dipstick: NEGATIVE
Ketones, ur: NEGATIVE mg/dL
Leukocytes, UA: NEGATIVE
Protein, ur: NEGATIVE mg/dL
pH: 5 (ref 5.0–8.0)

## 2012-07-31 LAB — BLOOD GAS, ARTERIAL
Bicarbonate: 21.6 mEq/L (ref 20.0–24.0)
Drawn by: 34779
FIO2: 0.21 %
Patient temperature: 98.6
pH, Arterial: 7.434 (ref 7.350–7.450)

## 2012-07-31 LAB — LACTIC ACID, PLASMA: Lactic Acid, Venous: 2.6 mmol/L — ABNORMAL HIGH (ref 0.5–2.2)

## 2012-07-31 LAB — LIPID PANEL
Cholesterol: 157 mg/dL (ref 0–200)
LDL Cholesterol: 95 mg/dL (ref 0–99)
Triglycerides: 39 mg/dL (ref <150)

## 2012-07-31 LAB — CBC
Hemoglobin: 11 g/dL — ABNORMAL LOW (ref 13.0–17.0)
Platelets: 92 10*3/uL — ABNORMAL LOW (ref 150–400)
RBC: 3.76 MIL/uL — ABNORMAL LOW (ref 4.22–5.81)
WBC: 5 10*3/uL (ref 4.0–10.5)

## 2012-07-31 LAB — BASIC METABOLIC PANEL
CO2: 21 mEq/L (ref 19–32)
CO2: 23 mEq/L (ref 19–32)
Calcium: 8.9 mg/dL (ref 8.4–10.5)
Calcium: 9.5 mg/dL (ref 8.4–10.5)
Chloride: 95 mEq/L — ABNORMAL LOW (ref 96–112)
Creatinine, Ser: 1.13 mg/dL (ref 0.50–1.35)
GFR calc Af Amer: 86 mL/min — ABNORMAL LOW (ref >90)
GFR calc Af Amer: >90 mL/min (ref >90)
GFR calc non Af Amer: 74 mL/min — ABNORMAL LOW (ref >90)
Potassium: 3.9 mEq/L (ref 3.5–5.1)
Potassium: 4.1 mEq/L (ref 3.5–5.1)
Sodium: 131 mEq/L — ABNORMAL LOW (ref 135–145)
Sodium: 135 mEq/L (ref 135–145)

## 2012-07-31 LAB — URINE MICROSCOPIC-ADD ON

## 2012-07-31 LAB — APTT: aPTT: 33 seconds (ref 24–37)

## 2012-07-31 LAB — HEMOGLOBIN A1C
Hgb A1c MFr Bld: 8.8 % — ABNORMAL HIGH (ref <5.7)
Mean Plasma Glucose: 206 mg/dL — ABNORMAL HIGH (ref <117)

## 2012-07-31 MED ORDER — INSULIN ASPART 100 UNIT/ML ~~LOC~~ SOLN
4.0000 [IU] | Freq: Three times a day (TID) | SUBCUTANEOUS | Status: DC
  Administered 2012-07-31 (×2): 4 [IU] via SUBCUTANEOUS

## 2012-07-31 MED ORDER — SODIUM CHLORIDE 0.9 % IV SOLN
INTRAVENOUS | Status: DC
  Administered 2012-07-31: 3.9 [IU]/h via INTRAVENOUS
  Filled 2012-07-31: qty 1

## 2012-07-31 MED ORDER — DEXTROSE-NACL 5-0.45 % IV SOLN
INTRAVENOUS | Status: DC
  Administered 2012-07-31: 06:00:00 via INTRAVENOUS

## 2012-07-31 MED ORDER — POTASSIUM CHLORIDE CRYS ER 20 MEQ PO TBCR
40.0000 meq | EXTENDED_RELEASE_TABLET | Freq: Once | ORAL | Status: AC
  Administered 2012-07-31: 40 meq via ORAL

## 2012-07-31 MED ORDER — POTASSIUM CHLORIDE 10 MEQ/100ML IV SOLN
10.0000 meq | INTRAVENOUS | Status: DC
  Administered 2012-07-31: 10 meq via INTRAVENOUS
  Filled 2012-07-31: qty 100

## 2012-07-31 MED ORDER — INSULIN ASPART 100 UNIT/ML ~~LOC~~ SOLN
0.0000 [IU] | Freq: Every day | SUBCUTANEOUS | Status: DC
  Administered 2012-08-01: 2 [IU] via SUBCUTANEOUS

## 2012-07-31 MED ORDER — INSULIN ASPART 100 UNIT/ML ~~LOC~~ SOLN
0.0000 [IU] | Freq: Three times a day (TID) | SUBCUTANEOUS | Status: DC
  Administered 2012-07-31: 8 [IU] via SUBCUTANEOUS
  Administered 2012-07-31 – 2012-08-01 (×2): 3 [IU] via SUBCUTANEOUS
  Administered 2012-08-01: 2 [IU] via SUBCUTANEOUS
  Administered 2012-08-01: 5 [IU] via SUBCUTANEOUS

## 2012-07-31 MED ORDER — DEXTROSE 50 % IV SOLN: 25.0000 mL | INTRAVENOUS | Status: DC | PRN

## 2012-07-31 MED ORDER — POTASSIUM CHLORIDE CRYS ER 20 MEQ PO TBCR
EXTENDED_RELEASE_TABLET | ORAL | Status: AC
  Administered 2012-07-31: 20 meq via ORAL
  Filled 2012-07-31: qty 2

## 2012-07-31 MED ORDER — INSULIN GLARGINE 100 UNIT/ML ~~LOC~~ SOLN
12.0000 [IU] | Freq: Every day | SUBCUTANEOUS | Status: DC
  Administered 2012-07-31: 12 [IU] via SUBCUTANEOUS

## 2012-07-31 MED ORDER — POTASSIUM CHLORIDE CRYS ER 20 MEQ PO TBCR
EXTENDED_RELEASE_TABLET | ORAL | Status: AC
  Filled 2012-07-31: qty 1

## 2012-07-31 NOTE — Progress Notes (Signed)
Called by nursing staff CBG >400, BMP reviewed and anion gap elevated at 17 with a normal bicarb.   Initiate IV insulin; ABG and routine labs while on glucometer; most likely will be able to transition back to SSI in am.

## 2012-07-31 NOTE — Progress Notes (Signed)
Patient ID: Roy [name], male   DOB: March 17, 1961, 52 y.o.   MRN: [id number]    SUBJECTIVE: Did not sleep last night because he was put on glucose stabilizer.  No dyspnea/chest pain.      Marland Kitchen aspirin EC  81 mg Oral Daily  . atorvastatin  40 mg Oral q1800  . bisacodyl  5 mg Oral Once  . diazepam  5 mg Oral Once  . furosemide  40 mg Oral BID  . insulin aspart  0-15 Units Subcutaneous TID WC  . insulin aspart  0-5 Units Subcutaneous QHS  . insulin aspart  4 Units Subcutaneous TID WC  . insulin glargine  12 Units Subcutaneous Daily  . linagliptin  5 mg Oral Daily  . lisinopril  5 mg Oral Daily  . metoprolol succinate  25 mg Oral Daily  . metoprolol tartrate  12.5 mg Oral Once  . sodium chloride  3 mL Intravenous Q12H  . vancomycin  1,250 mg Intravenous Q12H  Integrilin gtt    Filed Vitals:   07/30/12 1956 07/31/12 0105 07/31/12 0437 07/31/12 0438  BP:  100/35  108/50  Pulse: 102     Temp:  98.5 F (36.9 C)  98.3 F (36.8 C)  TempSrc:  Oral  Oral  Resp:      Height:      Weight:   194 lb 0.1 oz (88 kg)   SpO2: 94%       Intake/Output Summary (Last 24 hours) at 07/31/12 0850 Last data filed at 07/31/12 0500  Gross per 24 hour  Intake  890.1 ml  Output   1215 ml  Net -324.9 ml    LABS: Basic Metabolic Panel:  Basename 07/31/12 0618 07/31/12 0319  NA 135 133*  K 3.8 3.9  CL 99 95*  CO2 23 24  GLUCOSE 235* 413*  BUN 25* 26*  CREATININE 1.04 1.13  CALCIUM 9.5 9.2  MG -- --  PHOS -- --   Liver Function Tests:  Basename 07/28/12 1430  AST 17  ALT 14  ALKPHOS 61  BILITOT 0.5  PROT 7.0  ALBUMIN 3.8   No results found for this basename: LIPASE:2,AMYLASE:2 in the last 72 hours CBC:  Basename 07/31/12 0319 07/28/12 1430  WBC 5.0 5.0  NEUTROABS -- 2.5  HGB 11.0* 12.3*  HCT 32.1* 36.1*  MCV 85.4 87.0  PLT 92* 96*   Cardiac Enzymes: No results found for this basename: CKTOTAL:3,CKMB:3,CKMBINDEX:3,TROPONINI:3 in the last 72 hours BNP: No components  found with this basename: POCBNP:3 D-Dimer: No results found for this basename: DDIMER:2 in the last 72 hours Hemoglobin A1C: No results found for this basename: HGBA1C in the last 72 hours Fasting Lipid Panel:  Basename 07/31/12 0319  CHOL 157  HDL 54  LDLCALC 95  TRIG 39  CHOLHDL 2.9  LDLDIRECT --   Thyroid Function Tests: No results found for this basename: TSH,T4TOTAL,FREET3,T3FREE,THYROIDAB in the last 72 hours Anemia Panel: No results found for this basename: VITAMINB12,FOLATE,FERRITIN,TIBC,IRON,RETICCTPCT in the last 72 hours  RADIOLOGY: Dg Chest 2 View  07/31/2012  *RADIOLOGY REPORT*  Clinical Data: preoperative evaluation for coronary bypass  CHEST - 2 VIEW  Comparison: 07/28/2012  Findings: Normal heart size and vascularity.  Negative for CHF or pneumonia.  No focal airspace process, collapse, consolidation, effusion or pneumothorax.  Trachea midline.  IMPRESSION: No acute chest process   Original Report Authenticated By: Judie Petit. Shick, M.D.    X-ray Chest Pa And Lateral  07/28/2012  *RADIOLOGY REPORT*  Clinical  Data: Cough and shortness of breath  CHEST - 2 VIEW  Comparison: June 23, 2012  Findings:  Lungs clear.  Heart size and pulmonary vascularity are normal.  No adenopathy.  No bone lesions.  IMPRESSION: No abnormality noted.   Original Report Authenticated By: Bretta Bang, M.D.    Ct Coronary Morp W/cta Cor W/score W/ca W/cm &/or Wo/cm  07/30/2012  Cardiac CT:  Indication: R/O dissection  Protocol:  The patient was fairly sick with recent MI, ? SBE and severe AR.  BP was low and he could only be premedicated with 2.5mg  of iv lopressor.  No nitro was given.  Dye allergy prophylaxis was also given.  Average HR during scan was in the 90;s.  A 120 kV retrospective scan was done.  Patient received 80 cc of contrast. He was scanned on a Philips 256 slice scanner with gantry rotation speed 270 msec and collimation .9mm.  The 3D data set was reviewed using MIP, VRT and MPR modes   Findings: There was no evidence of sinus of valsalva aneurysm, or aortic dissection  Sinus of Valsalva: 3.2 cm Ascending Aorta: 3.2 cm Arch: 2.4 cm Descending thoracic aorta: 2.2 cm  The great vessels arose from the arch normally with no coarctation.  The Aortic valve appeared tri-leaflet.  Although not well seen there appeared to be a central area of malcoaptation between the noncoronary cusp and right coronary cusp.  No obvious vegetation was seen.  The main pulmonary artery was mildly dilated at 3.0 cm  There was no pericardial effusion.  Coronary arteries:  Left dominant with no anomalies  LM- calcified especially distally near take off of circumflex with no obvious dissection  LAD- patent with non obstructive calcific disease with distal vessel poorly visualized        Diagonals:  2 small diagonals visualize without critical disease  Circumflex- dominant and patent but with large stent in mid vessel making analysis impossible        OM1: Patent with stent  RCA: nondominant and small with likely obstructive disease in mid vessel  See separate report from [facility] for interpretation of limited soft tissue and lung windows  Impression:     1)    No aortic dissection Ascending aorta 3.2 cm see other       measurements above        2)    Trileaflet AV with central malcoaptation and no vegetation seen on poor quality study 3)    No LM dissection appreciated with dense area of calcification at circumflex take off 4)    Patent stent to mid left dominant circumflex artery 5)    No pericardial effusion 6)    Mildly dilated main pulmonary artery  Charlton Haws MD [facility]   Original Report Authenticated By: Charlton Haws, M.D.     PHYSICAL EXAM General: NAD Neck: No JVD, no thyromegaly or thyroid nodule.  Lungs: Clear to auscultation bilaterally with normal respiratory effort. CV: Nondisplaced PMI.  Heart regular S1/S2, no S3/S4, 3/6 diastolic murmur.  No peripheral edema.  No carotid bruit.  Normal pedal  pulses.  Abdomen: Soft, nontender, no hepatosplenomegaly, no distention.  Neurologic: Alert and oriented x 3.  Psych: Normal affect. Extremities: No clubbing or cyanosis.   TELEMETRY: Reviewed telemetry pt in NSR  ASSESSMENT AND PLAN: 52 yo with history of CAD s/p PCI in 12/13 now with severe AI.  1. Aortic insufficiency: ? IE versus mechanical complication at cath.  TEE was not definitive.  No aortic valve vegetation seen on CT.  Blood cultures negative.  I would probably lean towards mechanical complication.  Will not know for sure until valve surgery.  - AVR on Monday.  - Continue vancomycin for now.  - Continue lisinopril for afterload reduction and current Lasix.  2. CAD: Cath yesterday showed patent LCx stent but possibly dissected distal LM/ostial LCx.  Will need CABG with AVR.  He is on Integrilin (off Effient).  Had BMS to LCx in early 12/13.  3. Diabetes: Blood glucose now stable.  He is off his metformin and Actos.  I will put him on Lantus and Novolog for now.   Marca Ancona 07/31/2012 8:54 AM

## 2012-07-31 NOTE — Progress Notes (Addendum)
The patient received KCL twice. 40 mEq once and then in less then 1 h another 20 mEq.   I ordered on;y 40 mEq; the other 20 mEq were not ordered through [facility];   The issue was discussed with the charge nurse; seems to be a system problem.   We will repeat K levels in 4 hours.

## 2012-07-31 NOTE — Progress Notes (Signed)
CBGs > 400 X2. Notified MD at [facility], ordered stat BMP. Awaiting results.

## 2012-07-31 NOTE — Progress Notes (Signed)
CONSULT NOTE - Follow Up Consult  Pharmacy Consult for Integrilin Indication: recent stent and questionable thrombus  Allergies  Allergen Reactions  . Contrast Media (Iodinated Diagnostic Agents) Other (See Comments)    Pt'shad streaks up and down his arms    Patient Measurements: Height: 5\' 9"  (175.3 cm) Weight: 194 lb 0.1 oz (88 kg) IBW/kg (Calculated) : 70.7   Vital Signs: Temp: 98.3 F (36.8 C) (01/11 0438) Temp src: Oral (01/11 0438) BP: 108/50 mmHg (01/11 0438)  Labs:  Basename 07/31/12 0618 07/31/12 0319 07/30/12 2348 07/28/12 1430  HGB -- 11.0* -- 12.3*  HCT -- 32.1* -- 36.1*  PLT -- 92* -- 96*  APTT -- 33 -- --  LABPROT -- 13.0 -- --  INR -- 0.99 -- --  HEPARINUNFRC -- -- -- --  CREATININE 1.04 1.13 1.18 --  CKTOTAL -- -- -- --  CKMB -- -- -- --  TROPONINI -- -- -- --    Estimated Creatinine Clearance: 93.3 ml/min (by C-G formula based on Cr of 1.04).   Medications:  Scheduled:    . [COMPLETED] aspirin  324 mg Oral Pre-Cath  . aspirin EC  81 mg Oral Daily  . atorvastatin  40 mg Oral q1800  . bisacodyl  5 mg Oral Once  . [COMPLETED] chlorhexidine  60 mL Topical Once  . diazepam  5 mg Oral Once  . [COMPLETED] diphenhydrAMINE  25 mg Intravenous Once  . [COMPLETED] eptifibatide  180 mcg/kg Intravenous Once  . [COMPLETED] famotidine  20 mg Intravenous Once  . furosemide  40 mg Oral BID  . [COMPLETED] heparin      . insulin aspart  0-15 Units Subcutaneous TID WC  . insulin aspart  0-5 Units Subcutaneous QHS  . insulin aspart  4 Units Subcutaneous TID WC  . insulin glargine  12 Units Subcutaneous Daily  . [COMPLETED] lidocaine      . linagliptin  5 mg Oral Daily  . lisinopril  5 mg Oral Daily  . [COMPLETED] methylPREDNISolone sodium succinate  125 mg Intravenous Once  . [COMPLETED] metoprolol  2.5 mg Intravenous Once  . metoprolol succinate  25 mg Oral Daily  . metoprolol tartrate  12.5 mg Oral Once  . [COMPLETED] midazolam      . [COMPLETED]  midazolam      . [COMPLETED] nitroGLYCERIN      . [COMPLETED] potassium chloride SA      . [COMPLETED] potassium chloride  40 mEq Oral Once  . sodium chloride  3 mL Intravenous Q12H  . vancomycin  1,250 mg Intravenous Q12H  . [COMPLETED] verapamil      . [DISCONTINUED] bisacodyl  5 mg Oral Once  . [DISCONTINUED] enoxaparin (LOVENOX) injection  40 mg Subcutaneous QHS  . [DISCONTINUED] insulin aspart  0-15 Units Subcutaneous TID WC  . [DISCONTINUED] insulin aspart  0-15 Units Subcutaneous TID WC  . [DISCONTINUED] insulin aspart  0-5 Units Subcutaneous QHS  . [DISCONTINUED] metFORMIN  1,000 mg Oral Q breakfast  . [DISCONTINUED] metoprolol tartrate  12.5 mg Oral Once  . [DISCONTINUED] potassium chloride  10 mEq Intravenous Q1H  . [DISCONTINUED] sodium chloride  3 mL Intravenous Q12H    Assessment: 51 YOM s/p cath on 1/10 with planned aortic valve repair and possible CABG on Monday 1/13 on Integrilin as antiplatelet therapy for ? Thrombus and recent stent. Is also on vancomycin for possible endocarditis. CBC ok this morning- Hgb 11, Platelets still low at 92. No bleeding noted.  Goal of Therapy:  aPTT  50-70 seconds Monitor platelets by anticoagulation protocol: Yes   Plan:  1. Continue Integrelin at 35mcg/kg/min 2. Follow up CBC to ensure stability 3.Follow up results of surgery on Monday

## 2012-08-01 LAB — BASIC METABOLIC PANEL
BUN: 27 mg/dL — ABNORMAL HIGH (ref 6–23)
CO2: 26 mEq/L (ref 19–32)
Chloride: 100 mEq/L (ref 96–112)
Creatinine, Ser: 0.99 mg/dL (ref 0.50–1.35)
GFR calc Af Amer: >90 mL/min (ref >90)
Glucose, Bld: 185 mg/dL — ABNORMAL HIGH (ref 70–99)
Potassium: 4.1 mEq/L (ref 3.5–5.1)

## 2012-08-01 LAB — CBC
HCT: 32.4 % — ABNORMAL LOW (ref 39.0–52.0)
Hemoglobin: 11.1 g/dL — ABNORMAL LOW (ref 13.0–17.0)
MCV: 86.9 fL (ref 78.0–100.0)
RBC: 3.73 MIL/uL — ABNORMAL LOW (ref 4.22–5.81)
RDW: 13.9 % (ref 11.5–15.5)
WBC: 6.6 10*3/uL (ref 4.0–10.5)

## 2012-08-01 LAB — URINE CULTURE
Colony Count: NO GROWTH
Culture: NO GROWTH

## 2012-08-01 LAB — GLUCOSE, CAPILLARY: Glucose-Capillary: 156 mg/dL — ABNORMAL HIGH (ref 70–99)

## 2012-08-01 LAB — SURGICAL PCR SCREEN: Staphylococcus aureus: NEGATIVE

## 2012-08-01 MED ORDER — MAGNESIUM SULFATE 50 % IJ SOLN
40.0000 meq | INTRAMUSCULAR | Status: DC
  Filled 2012-08-01: qty 10

## 2012-08-01 MED ORDER — SODIUM CHLORIDE 0.9 % IV SOLN
INTRAVENOUS | Status: AC
  Administered 2012-08-02: 2.8 [IU]/h via INTRAVENOUS
  Filled 2012-08-01: qty 1

## 2012-08-01 MED ORDER — PHENYLEPHRINE HCL 10 MG/ML IJ SOLN
30.0000 ug/min | INTRAMUSCULAR | Status: DC
  Filled 2012-08-01: qty 2

## 2012-08-01 MED ORDER — INSULIN GLARGINE 100 UNIT/ML ~~LOC~~ SOLN
15.0000 [IU] | Freq: Every day | SUBCUTANEOUS | Status: DC
  Administered 2012-08-01: 15 [IU] via SUBCUTANEOUS

## 2012-08-01 MED ORDER — VERAPAMIL HCL 2.5 MG/ML IV SOLN
INTRAVENOUS | Status: AC
  Administered 2012-08-02: 09:00:00
  Filled 2012-08-01: qty 2.5

## 2012-08-01 MED ORDER — SODIUM CHLORIDE 0.9 % IV SOLN
INTRAVENOUS | Status: DC
  Filled 2012-08-01: qty 40

## 2012-08-01 MED ORDER — CHLORHEXIDINE GLUCONATE 4 % EX LIQD
CUTANEOUS | Status: AC
  Administered 2012-08-01: 22:00:00
  Filled 2012-08-01: qty 60

## 2012-08-01 MED ORDER — DEXTROSE 5 % IV SOLN
750.0000 mg | INTRAVENOUS | Status: DC
  Filled 2012-08-01: qty 750

## 2012-08-01 MED ORDER — DEXMEDETOMIDINE HCL IN NACL 400 MCG/100ML IV SOLN
0.1000 ug/kg/h | INTRAVENOUS | Status: AC
  Administered 2012-08-02: .2 ug/kg/h via INTRAVENOUS
  Filled 2012-08-01: qty 100

## 2012-08-01 MED ORDER — PHENYLEPHRINE HCL 10 MG/ML IJ SOLN
30.0000 ug/min | INTRAVENOUS | Status: AC
  Administered 2012-08-02: 25 ug/min via INTRAVENOUS
  Filled 2012-08-01: qty 2

## 2012-08-01 MED ORDER — DEXTROSE 5 % IV SOLN
1.5000 g | INTRAVENOUS | Status: AC
  Administered 2012-08-02: 1.5 g via INTRAVENOUS
  Administered 2012-08-02: .75 g via INTRAVENOUS
  Filled 2012-08-01 (×2): qty 1.5

## 2012-08-01 MED ORDER — NITROGLYCERIN IN D5W 200-5 MCG/ML-% IV SOLN
2.0000 ug/min | INTRAVENOUS | Status: AC
  Administered 2012-08-02: 16 ug/min via INTRAVENOUS

## 2012-08-01 MED ORDER — NITROGLYCERIN IN D5W 200-5 MCG/ML-% IV SOLN
2.0000 ug/min | INTRAVENOUS | Status: DC
  Filled 2012-08-01: qty 250

## 2012-08-01 MED ORDER — SODIUM CHLORIDE 0.9 % IV SOLN
INTRAVENOUS | Status: DC
  Filled 2012-08-01: qty 1

## 2012-08-01 MED ORDER — EPINEPHRINE HCL 1 MG/ML IJ SOLN
0.5000 ug/min | INTRAVENOUS | Status: DC
  Filled 2012-08-01: qty 4

## 2012-08-01 MED ORDER — INSULIN ASPART 100 UNIT/ML ~~LOC~~ SOLN
5.0000 [IU] | Freq: Three times a day (TID) | SUBCUTANEOUS | Status: DC
  Administered 2012-08-01 (×2): 5 [IU] via SUBCUTANEOUS

## 2012-08-01 MED ORDER — POTASSIUM CHLORIDE 2 MEQ/ML IV SOLN
80.0000 meq | INTRAVENOUS | Status: DC
  Filled 2012-08-01: qty 40

## 2012-08-01 MED ORDER — VERAPAMIL HCL 2.5 MG/ML IV SOLN
INTRAVENOUS | Status: DC
  Filled 2012-08-01: qty 2.5

## 2012-08-01 MED ORDER — VANCOMYCIN HCL 10 G IV SOLR
1250.0000 mg | INTRAVENOUS | Status: DC
  Filled 2012-08-01: qty 1250

## 2012-08-01 MED ORDER — DOPAMINE-DEXTROSE 3.2-5 MG/ML-% IV SOLN
2.0000 ug/kg/min | INTRAVENOUS | Status: DC
  Filled 2012-08-01: qty 250

## 2012-08-01 MED ORDER — DOPAMINE-DEXTROSE 3.2-5 MG/ML-% IV SOLN: 2.0000 ug/kg/min | INTRAVENOUS | Status: DC

## 2012-08-01 MED ORDER — SODIUM CHLORIDE 0.9 % IV SOLN
INTRAVENOUS | Status: AC
  Administered 2012-08-02: 14 mL/h via INTRAVENOUS
  Filled 2012-08-01: qty 40

## 2012-08-01 MED ORDER — DEXMEDETOMIDINE HCL IN NACL 400 MCG/100ML IV SOLN
0.1000 ug/kg/h | INTRAVENOUS | Status: DC
  Filled 2012-08-01: qty 100

## 2012-08-01 NOTE — Progress Notes (Signed)
Patient ID: Roy [name], male   DOB: 05/26/1961, 52 y.o.   MRN: [id number]  SUBJECTIVE: No complaints this morning.  Awaiting surgery tomorrow.     Marland Kitchen aspirin EC  81 mg Oral Daily  . atorvastatin  40 mg Oral q1800  . bisacodyl  5 mg Oral Once  . diazepam  5 mg Oral Once  . furosemide  40 mg Oral BID  . insulin aspart  0-15 Units Subcutaneous TID WC  . insulin aspart  0-5 Units Subcutaneous QHS  . insulin aspart  5 Units Subcutaneous TID WC  . insulin glargine  15 Units Subcutaneous Daily  . linagliptin  5 mg Oral Daily  . lisinopril  5 mg Oral Daily  . metoprolol succinate  25 mg Oral Daily  . metoprolol tartrate  12.5 mg Oral Once  . sodium chloride  3 mL Intravenous Q12H  . vancomycin  1,250 mg Intravenous Q12H  Integrilin gtt    Filed Vitals:   07/31/12 2000 08/01/12 0000 08/01/12 0400 08/01/12 0500  BP: 106/56 97/42 96/43    Pulse:      Temp: 97.7 F (36.5 C) 98.2 F (36.8 C) 97.6 F (36.4 C)   TempSrc: Oral Oral Oral   Resp: 16 18 16    Height:      Weight:    192 lb 7.4 oz (87.3 kg)  SpO2: 95% 96% 94%     Intake/Output Summary (Last 24 hours) at 08/01/12 0809 Last data filed at 08/01/12 0700  Gross per 24 hour  Intake 2013.2 ml  Output   1600 ml  Net  413.2 ml    LABS: Basic Metabolic Panel:  Basename 08/01/12 0600 07/31/12 0618  NA 138 135  K 4.1 3.8  CL 100 99  CO2 26 23  GLUCOSE 185* 235*  BUN 27* 25*  CREATININE 0.99 1.04  CALCIUM 9.1 9.5  MG -- --  PHOS -- --   Liver Function Tests: No results found for this basename: AST:2,ALT:2,ALKPHOS:2,BILITOT:2,PROT:2,ALBUMIN:2 in the last 72 hours No results found for this basename: LIPASE:2,AMYLASE:2 in the last 72 hours CBC:  Basename 08/01/12 0600 07/31/12 0319  WBC 6.6 5.0  NEUTROABS -- --  HGB 11.1* 11.0*  HCT 32.4* 32.1*  MCV 86.9 85.4  PLT 91* 92*   Cardiac Enzymes: No results found for this basename: CKTOTAL:3,CKMB:3,CKMBINDEX:3,TROPONINI:3 in the last 72 hours BNP: No components  found with this basename: POCBNP:3 D-Dimer: No results found for this basename: DDIMER:2 in the last 72 hours Hemoglobin A1C:  Basename 07/31/12 0319  HGBA1C 8.8*   Fasting Lipid Panel:  Basename 07/31/12 0319  CHOL 157  HDL 54  LDLCALC 95  TRIG 39  CHOLHDL 2.9  LDLDIRECT --   Thyroid Function Tests: No results found for this basename: TSH,T4TOTAL,FREET3,T3FREE,THYROIDAB in the last 72 hours Anemia Panel: No results found for this basename: VITAMINB12,FOLATE,FERRITIN,TIBC,IRON,RETICCTPCT in the last 72 hours  RADIOLOGY: Dg Chest 2 View  07/31/2012  *RADIOLOGY REPORT*  Clinical Data: preoperative evaluation for coronary bypass  CHEST - 2 VIEW  Comparison: 07/28/2012  Findings: Normal heart size and vascularity.  Negative for CHF or pneumonia.  No focal airspace process, collapse, consolidation, effusion or pneumothorax.  Trachea midline.  IMPRESSION: No acute chest process   Original Report Authenticated By: Roy [name], M.D.    X-ray Chest Pa And Lateral  07/28/2012  *RADIOLOGY REPORT*  Clinical Data: Cough and shortness of breath  CHEST - 2 VIEW  Comparison: June 23, 2012  Findings:  Lungs clear.  Heart  size and pulmonary vascularity are normal.  No adenopathy.  No bone lesions.  IMPRESSION: No abnormality noted.   Original Report Authenticated By: Roy [name], M.D.    Ct Coronary Morp W/cta Cor W/score W/ca W/cm &/or Wo/cm  07/30/2012  Cardiac CT:  Indication: R/O dissection  Protocol:  The patient was fairly sick with recent MI, ? SBE and severe AR.  BP was low and he could only be premedicated with 2.5mg  of iv lopressor.  No nitro was given.  Dye allergy prophylaxis was also given.  Average HR during scan was in the 90;s.  A 120 kV retrospective scan was done.  Patient received 80 cc of contrast. He was scanned on a Philips 256 slice scanner with gantry rotation speed 270 msec and collimation .9mm.  The 3D data set was reviewed using MIP, VRT and MPR modes  Findings: There was  no evidence of sinus of valsalva aneurysm, or aortic dissection  Sinus of Valsalva: 3.2 cm Ascending Aorta: 3.2 cm Arch: 2.4 cm Descending thoracic aorta: 2.2 cm  The great vessels arose from the arch normally with no coarctation.  The Aortic valve appeared tri-leaflet.  Although not well seen there appeared to be a central area of malcoaptation between the noncoronary cusp and right coronary cusp.  No obvious vegetation was seen.  The main pulmonary artery was mildly dilated at 3.0 cm  There was no pericardial effusion.  Coronary arteries:  Left dominant with no anomalies  LM- calcified especially distally near take off of circumflex with no obvious dissection  LAD- patent with non obstructive calcific disease with distal vessel poorly visualized        Diagonals:  2 small diagonals visualize without critical disease  Circumflex- dominant and patent but with large stent in mid vessel making analysis impossible        OM1: Patent with stent  RCA: nondominant and small with likely obstructive disease in mid vessel  See separate report from [facility] for interpretation of limited soft tissue and lung windows  Impression:     1)    No aortic dissection Ascending aorta 3.2 cm see other       measurements above        2)    Trileaflet AV with central malcoaptation and no vegetation seen on poor quality study 3)    No LM dissection appreciated with dense area of calcification at circumflex take off 4)    Patent stent to mid left dominant circumflex artery 5)    No pericardial effusion 6)    Mildly dilated main pulmonary artery  Roy Haws MD [facility]   Original Report Authenticated By: Roy [name], M.D.     PHYSICAL EXAM General: NAD Neck: No JVD, no thyromegaly or thyroid nodule.  Lungs: Clear to auscultation bilaterally with normal respiratory effort. CV: Nondisplaced PMI.  Heart regular S1/S2, no S3/S4, 3/6 diastolic murmur.  No peripheral edema.  No carotid bruit.  Normal pedal pulses.  Abdomen:  Soft, nontender, no hepatosplenomegaly, no distention.  Neurologic: Alert and oriented x 3.  Psych: Normal affect. Extremities: No clubbing or cyanosis.   TELEMETRY: Reviewed telemetry pt in NSR  ASSESSMENT AND PLAN: 52 yo with history of CAD s/p PCI in 12/13 now with severe AI.  1. Aortic insufficiency: ? IE versus mechanical complication at cath.  TEE was not definitive.  No aortic valve vegetation seen on CT.  Blood cultures negative.  I would probably lean towards mechanical complication.  Will not know  for sure until valve surgery.  - AVR on Monday.  - Continue vancomycin for now.  - Continue lisinopril for afterload reduction and current Lasix.  2. CAD: Cath Friday showed patent LCx stent but possibly dissected distal LM/ostial LCx.  Will need CABG with AVR.  He is on Integrilin (off Effient).  Had BMS to LCx in early 12/13.  3. Diabetes: Will increase Novolog and Lantus today, glucose still elevated.   Roy [name] 08/01/2012 8:09 AM

## 2012-08-02 ENCOUNTER — Encounter (HOSPITAL_COMMUNITY): Payer: Self-pay | Admitting: Critical Care Medicine

## 2012-08-02 ENCOUNTER — Inpatient Hospital Stay (HOSPITAL_COMMUNITY): Payer: BC Managed Care – PPO

## 2012-08-02 ENCOUNTER — Inpatient Hospital Stay (HOSPITAL_COMMUNITY): Payer: BC Managed Care – PPO | Admitting: Critical Care Medicine

## 2012-08-02 ENCOUNTER — Encounter (HOSPITAL_COMMUNITY)
Admission: AD | Disposition: A | Discharge status: Home / Self Care | Payer: Self-pay | Source: Ambulatory Visit | Attending: Cardiology

## 2012-08-02 DIAGNOSIS — Z951 Presence of aortocoronary bypass graft: Secondary | ICD-10-CM

## 2012-08-02 DIAGNOSIS — I2510 Atherosclerotic heart disease of native coronary artery without angina pectoris: Secondary | ICD-10-CM

## 2012-08-02 DIAGNOSIS — I359 Nonrheumatic aortic valve disorder, unspecified: Secondary | ICD-10-CM

## 2012-08-02 DIAGNOSIS — Z952 Presence of prosthetic heart valve: Secondary | ICD-10-CM

## 2012-08-02 HISTORY — PX: AORTIC VALVE REPLACEMENT: SHX41

## 2012-08-02 HISTORY — DX: Presence of aortocoronary bypass graft: Z95.1

## 2012-08-02 HISTORY — PX: ENDOVEIN HARVEST OF GREATER SAPHENOUS VEIN: SHX5059

## 2012-08-02 HISTORY — DX: Presence of prosthetic heart valve: Z95.2

## 2012-08-02 HISTORY — PX: CORONARY ARTERY BYPASS GRAFT: SHX141

## 2012-08-02 HISTORY — PX: INTRAOPERATIVE TRANSESOPHAGEAL ECHOCARDIOGRAM: SHX5062

## 2012-08-02 LAB — POCT I-STAT 4, (NA,K, GLUC, HGB,HCT)
Glucose, Bld: 141 mg/dL — ABNORMAL HIGH (ref 70–99)
Glucose, Bld: 123 mg/dL — ABNORMAL HIGH (ref 70–99)
Glucose, Bld: 166 mg/dL — ABNORMAL HIGH (ref 70–99)
Glucose, Bld: 105 mg/dL — ABNORMAL HIGH (ref 70–99)
HCT: 32 % — ABNORMAL LOW (ref 39.0–52.0)
HCT: 24 % — ABNORMAL LOW (ref 39.0–52.0)
HCT: 23 % — ABNORMAL LOW (ref 39.0–52.0)
HCT: 21 % — ABNORMAL LOW (ref 39.0–52.0)
HCT: 28 % — ABNORMAL LOW (ref 39.0–52.0)
Hemoglobin: 10.2 g/dL — ABNORMAL LOW (ref 13.0–17.0)
Hemoglobin: 7.8 g/dL — ABNORMAL LOW (ref 13.0–17.0)
Hemoglobin: 7.1 g/dL — ABNORMAL LOW (ref 13.0–17.0)
Hemoglobin: 7.1 g/dL — ABNORMAL LOW (ref 13.0–17.0)
Potassium: 4.2 mEq/L (ref 3.5–5.1)
Potassium: 4.2 mEq/L (ref 3.5–5.1)
Potassium: 4.7 mEq/L (ref 3.5–5.1)
Potassium: 4.9 mEq/L (ref 3.5–5.1)
Sodium: 134 mEq/L — ABNORMAL LOW (ref 135–145)
Sodium: 127 mEq/L — ABNORMAL LOW (ref 135–145)
Sodium: 135 mEq/L (ref 135–145)
Sodium: 139 mEq/L (ref 135–145)
Sodium: 140 mEq/L (ref 135–145)

## 2012-08-02 LAB — CBC
HCT: 25.2 % — ABNORMAL LOW (ref 39.0–52.0)
HCT: 21.9 % — ABNORMAL LOW (ref 39.0–52.0)
Hemoglobin: 11.6 g/dL — ABNORMAL LOW (ref 13.0–17.0)
Hemoglobin: 8.9 g/dL — ABNORMAL LOW (ref 13.0–17.0)
Hemoglobin: 7.8 g/dL — ABNORMAL LOW (ref 13.0–17.0)
MCH: 30.2 pg (ref 26.0–34.0)
MCV: 86.7 fL (ref 78.0–100.0)
MCV: 86 fL (ref 78.0–100.0)
MCV: 85.2 fL (ref 78.0–100.0)
Platelets: 92 10*3/uL — ABNORMAL LOW (ref 150–400)
RBC: 3.84 MIL/uL — ABNORMAL LOW (ref 4.22–5.81)
RBC: 2.93 MIL/uL — ABNORMAL LOW (ref 4.22–5.81)
RBC: 2.57 MIL/uL — ABNORMAL LOW (ref 4.22–5.81)
WBC: 6.4 10*3/uL (ref 4.0–10.5)
WBC: 7.3 10*3/uL (ref 4.0–10.5)

## 2012-08-02 LAB — POCT I-STAT 3, ART BLOOD GAS (G3+)
Acid-base deficit: 1 mmol/L (ref 0.0–2.0)
Bicarbonate: 29.6 mEq/L — ABNORMAL HIGH (ref 20.0–24.0)
Bicarbonate: 22.7 mEq/L (ref 20.0–24.0)
Bicarbonate: 22.9 mEq/L (ref 20.0–24.0)
O2 Saturation: 100 %
O2 Saturation: 100 %
O2 Saturation: 93 %
Patient temperature: 36.2
Patient temperature: 36.7
TCO2: 26 mmol/L (ref 0–100)
TCO2: 25 mmol/L (ref 0–100)
pCO2 arterial: 47.1 mmHg — ABNORMAL HIGH (ref 35.0–45.0)
pCO2 arterial: 42 mmHg (ref 35.0–45.0)
pH, Arterial: 7.377 (ref 7.350–7.450)
pH, Arterial: 7.378 (ref 7.350–7.450)
pH, Arterial: 7.374 (ref 7.350–7.450)
pO2, Arterial: 409 mmHg — ABNORMAL HIGH (ref 80.0–100.0)
pO2, Arterial: 335 mmHg — ABNORMAL HIGH (ref 80.0–100.0)
pO2, Arterial: 64 mmHg — ABNORMAL LOW (ref 80.0–100.0)
pO2, Arterial: 69 mmHg — ABNORMAL LOW (ref 80.0–100.0)

## 2012-08-02 LAB — POCT I-STAT, CHEM 8
Calcium, Ion: 1.11 mmol/L — ABNORMAL LOW (ref 1.12–1.23)
Chloride: 105 mEq/L (ref 96–112)
HCT: 21 % — ABNORMAL LOW (ref 39.0–52.0)
Potassium: 4.5 mEq/L (ref 3.5–5.1)

## 2012-08-02 LAB — BASIC METABOLIC PANEL
BUN: 27 mg/dL — ABNORMAL HIGH (ref 6–23)
CO2: 28 mEq/L (ref 19–32)
Calcium: 9.1 mg/dL (ref 8.4–10.5)
Glucose, Bld: 138 mg/dL — ABNORMAL HIGH (ref 70–99)
Potassium: 3.8 mEq/L (ref 3.5–5.1)
Sodium: 137 mEq/L (ref 135–145)

## 2012-08-02 LAB — HEMOGLOBIN AND HEMATOCRIT, BLOOD
HCT: 22.5 % — ABNORMAL LOW (ref 39.0–52.0)
Hemoglobin: 7.9 g/dL — ABNORMAL LOW (ref 13.0–17.0)

## 2012-08-02 LAB — GLUCOSE, CAPILLARY
Glucose-Capillary: 75 mg/dL (ref 70–99)
Glucose-Capillary: 96 mg/dL (ref 70–99)
Glucose-Capillary: 105 mg/dL — ABNORMAL HIGH (ref 70–99)
Glucose-Capillary: 100 mg/dL — ABNORMAL HIGH (ref 70–99)

## 2012-08-02 LAB — CREATININE, SERUM
GFR calc Af Amer: >90 mL/min (ref >90)
GFR calc non Af Amer: >90 mL/min (ref >90)

## 2012-08-02 LAB — APTT: aPTT: 38 seconds — ABNORMAL HIGH (ref 24–37)

## 2012-08-02 SURGERY — REPLACEMENT, AORTIC VALVE, OPEN
Anesthesia: General | Site: Leg Upper | Wound class: Clean

## 2012-08-02 MED ORDER — SODIUM CHLORIDE 0.9 % IJ SOLN
OROMUCOSAL | Status: DC | PRN
  Administered 2012-08-02: 10:00:00 via TOPICAL

## 2012-08-02 MED ORDER — ATORVASTATIN CALCIUM 40 MG PO TABS
40.0000 mg | ORAL_TABLET | Freq: Every day | ORAL | Status: DC
  Administered 2012-08-03 – 2012-08-06 (×4): 40 mg via ORAL
  Filled 2012-08-02 (×5): qty 1

## 2012-08-02 MED ORDER — SODIUM CHLORIDE 0.9 % IJ SOLN: 3.0000 mL | INTRAMUSCULAR | Status: DC | PRN

## 2012-08-02 MED ORDER — SODIUM CHLORIDE 0.45 % IV SOLN
INTRAVENOUS | Status: DC
  Administered 2012-08-02: 15:00:00 via INTRAVENOUS

## 2012-08-02 MED ORDER — BISACODYL 5 MG PO TBEC
10.0000 mg | DELAYED_RELEASE_TABLET | Freq: Every day | ORAL | Status: DC
  Administered 2012-08-03 – 2012-08-07 (×5): 10 mg via ORAL
  Filled 2012-08-02 (×5): qty 2

## 2012-08-02 MED ORDER — ONDANSETRON HCL 4 MG/2ML IJ SOLN
4.0000 mg | Freq: Four times a day (QID) | INTRAMUSCULAR | Status: DC | PRN
  Administered 2012-08-03: 4 mg via INTRAVENOUS
  Filled 2012-08-02: qty 2

## 2012-08-02 MED ORDER — AMINOCAPROIC ACID 250 MG/ML IV SOLN
INTRAVENOUS | Status: DC | PRN
  Administered 2012-08-02: 5 g via INTRAVENOUS

## 2012-08-02 MED ORDER — PANTOPRAZOLE SODIUM 40 MG PO TBEC
40.0000 mg | DELAYED_RELEASE_TABLET | Freq: Every day | ORAL | Status: DC
  Administered 2012-08-04 – 2012-08-07 (×4): 40 mg via ORAL
  Filled 2012-08-02 (×4): qty 1

## 2012-08-02 MED ORDER — ACETAMINOPHEN 160 MG/5ML PO SOLN
975.0000 mg | Freq: Four times a day (QID) | ORAL | Status: DC
  Filled 2012-08-02: qty 40.6

## 2012-08-02 MED ORDER — SODIUM CHLORIDE 0.9 % IV SOLN
1.0000 g/h | INTRAVENOUS | Status: DC
  Filled 2012-08-02: qty 20

## 2012-08-02 MED ORDER — DEXMEDETOMIDINE HCL IN NACL 200 MCG/50ML IV SOLN
0.1000 ug/kg/h | INTRAVENOUS | Status: DC
  Filled 2012-08-02: qty 50

## 2012-08-02 MED ORDER — ALBUMIN HUMAN 5 % IV SOLN
INTRAVENOUS | Status: DC | PRN
  Administered 2012-08-02: 13:00:00 via INTRAVENOUS

## 2012-08-02 MED ORDER — METOPROLOL TARTRATE 12.5 MG HALF TABLET
12.5000 mg | ORAL_TABLET | Freq: Two times a day (BID) | ORAL | Status: DC
  Filled 2012-08-02 (×3): qty 1

## 2012-08-02 MED ORDER — DOCUSATE SODIUM 100 MG PO CAPS
200.0000 mg | ORAL_CAPSULE | Freq: Every day | ORAL | Status: DC
  Administered 2012-08-03 – 2012-08-07 (×5): 200 mg via ORAL
  Filled 2012-08-02 (×5): qty 2

## 2012-08-02 MED ORDER — VANCOMYCIN HCL IN DEXTROSE 1-5 GM/200ML-% IV SOLN
1000.0000 mg | Freq: Once | INTRAVENOUS | Status: AC
  Administered 2012-08-02: 1000 mg via INTRAVENOUS
  Filled 2012-08-02: qty 200

## 2012-08-02 MED ORDER — ACETAMINOPHEN 10 MG/ML IV SOLN
1000.0000 mg | Freq: Once | INTRAVENOUS | Status: AC
  Administered 2012-08-02: 1000 mg via INTRAVENOUS
  Filled 2012-08-02: qty 100

## 2012-08-02 MED ORDER — ASPIRIN 81 MG PO CHEW: 324.0000 mg | CHEWABLE_TABLET | Freq: Every day | ORAL | Status: DC

## 2012-08-02 MED ORDER — FAMOTIDINE IN NACL 20-0.9 MG/50ML-% IV SOLN
20.0000 mg | Freq: Two times a day (BID) | INTRAVENOUS | Status: AC
  Administered 2012-08-02 – 2012-08-03 (×2): 20 mg via INTRAVENOUS
  Filled 2012-08-02: qty 50

## 2012-08-02 MED ORDER — PHENYLEPHRINE HCL 10 MG/ML IJ SOLN
0.0000 ug/min | INTRAVENOUS | Status: DC
  Filled 2012-08-02 (×3): qty 2

## 2012-08-02 MED ORDER — OXYCODONE HCL 5 MG PO TABS
5.0000 mg | ORAL_TABLET | ORAL | Status: DC | PRN
  Administered 2012-08-02 – 2012-08-07 (×15): 10 mg via ORAL
  Filled 2012-08-02 (×15): qty 2

## 2012-08-02 MED ORDER — MAGNESIUM SULFATE 40 MG/ML IJ SOLN
4.0000 g | Freq: Once | INTRAMUSCULAR | Status: AC
  Administered 2012-08-02: 4 g via INTRAVENOUS
  Filled 2012-08-02: qty 100

## 2012-08-02 MED ORDER — LACTATED RINGERS IV SOLN
INTRAVENOUS | Status: DC | PRN
  Administered 2012-08-02 (×4): via INTRAVENOUS

## 2012-08-02 MED ORDER — HEPARIN SODIUM (PORCINE) 1000 UNIT/ML IJ SOLN
INTRAMUSCULAR | Status: DC | PRN
  Administered 2012-08-02: 17000 [IU] via INTRAVENOUS
  Administered 2012-08-02: 5000 [IU] via INTRAVENOUS

## 2012-08-02 MED ORDER — FENTANYL CITRATE 0.05 MG/ML IJ SOLN
INTRAMUSCULAR | Status: DC | PRN
  Administered 2012-08-02 (×3): 250 ug via INTRAVENOUS
  Administered 2012-08-02: 500 ug via INTRAVENOUS

## 2012-08-02 MED ORDER — LACTATED RINGERS IV SOLN: 500.0000 mL | Freq: Once | INTRAVENOUS | Status: AC | PRN

## 2012-08-02 MED ORDER — LACTATED RINGERS IV SOLN
INTRAVENOUS | Status: DC
  Administered 2012-08-02: 18:00:00 via INTRAVENOUS

## 2012-08-02 MED ORDER — BISACODYL 10 MG RE SUPP: 10.0000 mg | Freq: Every day | RECTAL | Status: DC

## 2012-08-02 MED ORDER — MORPHINE SULFATE 2 MG/ML IJ SOLN: 1.0000 mg | INTRAMUSCULAR | Status: AC | PRN

## 2012-08-02 MED ORDER — SODIUM CHLORIDE 0.9 % IV SOLN: 250.0000 mL | INTRAVENOUS | Status: DC | PRN

## 2012-08-02 MED ORDER — INSULIN REGULAR BOLUS VIA INFUSION
0.0000 [IU] | Freq: Three times a day (TID) | INTRAVENOUS | Status: DC
  Administered 2012-08-03 (×2): 4 [IU] via INTRAVENOUS
  Filled 2012-08-02: qty 10

## 2012-08-02 MED ORDER — MORPHINE SULFATE 2 MG/ML IJ SOLN
2.0000 mg | INTRAMUSCULAR | Status: DC | PRN
Start: 2012-08-02 — End: 2012-08-04
  Administered 2012-08-02 – 2012-08-03 (×4): 2 mg via INTRAVENOUS
  Administered 2012-08-03: 4 mg via INTRAVENOUS
  Filled 2012-08-02: qty 2
  Filled 2012-08-02 (×4): qty 1

## 2012-08-02 MED ORDER — METOPROLOL TARTRATE 1 MG/ML IV SOLN: 2.5000 mg | INTRAVENOUS | Status: DC | PRN

## 2012-08-02 MED ORDER — ALBUMIN HUMAN 5 % IV SOLN
250.0000 mL | INTRAVENOUS | Status: AC | PRN
  Administered 2012-08-02 (×3): 250 mL via INTRAVENOUS
  Filled 2012-08-02: qty 250

## 2012-08-02 MED ORDER — METOPROLOL TARTRATE 25 MG/10 ML ORAL SUSPENSION
12.5000 mg | Freq: Two times a day (BID) | ORAL | Status: DC
  Filled 2012-08-02 (×3): qty 5

## 2012-08-02 MED ORDER — ROCURONIUM BROMIDE 100 MG/10ML IV SOLN
INTRAVENOUS | Status: DC | PRN
  Administered 2012-08-02: 20 mg via INTRAVENOUS
  Administered 2012-08-02: 40 mg via INTRAVENOUS
  Administered 2012-08-02: 30 mg via INTRAVENOUS
  Administered 2012-08-02: 50 mg via INTRAVENOUS
  Administered 2012-08-02: 60 mg via INTRAVENOUS
  Administered 2012-08-02: 30 mg via INTRAVENOUS

## 2012-08-02 MED ORDER — SODIUM CHLORIDE 0.9 % IV SOLN
INTRAVENOUS | Status: DC
  Administered 2012-08-03: 3.2 [IU]/h via INTRAVENOUS
  Administered 2012-08-03: 4.5 [IU]/h via INTRAVENOUS
  Filled 2012-08-02 (×2): qty 1

## 2012-08-02 MED ORDER — ASPIRIN EC 325 MG PO TBEC
325.0000 mg | DELAYED_RELEASE_TABLET | Freq: Every day | ORAL | Status: DC
  Administered 2012-08-03: 325 mg via ORAL
  Filled 2012-08-02 (×2): qty 1

## 2012-08-02 MED ORDER — 0.9 % SODIUM CHLORIDE (POUR BTL) OPTIME
TOPICAL | Status: DC | PRN
  Administered 2012-08-02: 8000 mL

## 2012-08-02 MED ORDER — NITROGLYCERIN IN D5W 200-5 MCG/ML-% IV SOLN: 0.0000 ug/min | INTRAVENOUS | Status: DC

## 2012-08-02 MED ORDER — ACETAMINOPHEN 500 MG PO TABS
1000.0000 mg | ORAL_TABLET | Freq: Four times a day (QID) | ORAL | Status: DC
  Administered 2012-08-03 – 2012-08-07 (×10): 1000 mg via ORAL
  Filled 2012-08-02 (×20): qty 2

## 2012-08-02 MED ORDER — MIDAZOLAM HCL 5 MG/ML IJ SOLN
INTRAMUSCULAR | Status: DC | PRN
  Administered 2012-08-02: 2 mg via INTRAVENOUS
  Administered 2012-08-02: 5 mg via INTRAVENOUS
  Administered 2012-08-02: 3 mg via INTRAVENOUS

## 2012-08-02 MED ORDER — PROPOFOL 10 MG/ML IV BOLUS
INTRAVENOUS | Status: DC | PRN
  Administered 2012-08-02: 50 mg via INTRAVENOUS

## 2012-08-02 MED ORDER — SODIUM CHLORIDE 0.9 % IV SOLN
INTRAVENOUS | Status: DC
  Administered 2012-08-02: 18:00:00 via INTRAVENOUS

## 2012-08-02 MED ORDER — POTASSIUM CHLORIDE 10 MEQ/50ML IV SOLN: 10.0000 meq | INTRAVENOUS | Status: AC

## 2012-08-02 MED ORDER — PROTAMINE SULFATE 10 MG/ML IV SOLN
INTRAVENOUS | Status: DC | PRN
  Administered 2012-08-02: 20 mg via INTRAVENOUS
  Administered 2012-08-02: 220 mg via INTRAVENOUS

## 2012-08-02 MED ORDER — MIDAZOLAM HCL 2 MG/2ML IJ SOLN: 2.0000 mg | INTRAMUSCULAR | Status: DC | PRN

## 2012-08-02 MED ORDER — DEXTROSE 5 % IV SOLN
1.5000 g | Freq: Two times a day (BID) | INTRAVENOUS | Status: AC
  Administered 2012-08-02 – 2012-08-04 (×4): 1.5 g via INTRAVENOUS
  Filled 2012-08-02 (×4): qty 1.5

## 2012-08-02 MED ORDER — HEMOSTATIC AGENTS (NO CHARGE) OPTIME
TOPICAL | Status: DC | PRN
  Administered 2012-08-02: 1 via TOPICAL

## 2012-08-02 MED ORDER — SODIUM CHLORIDE 0.9 % IJ SOLN
3.0000 mL | Freq: Two times a day (BID) | INTRAMUSCULAR | Status: DC
  Administered 2012-08-03 – 2012-08-04 (×4): 3 mL via INTRAVENOUS

## 2012-08-02 SURGICAL SUPPLY — 106 items
ADAPTER CARDIO PERF ANTE/RETRO (ADAPTER) ×4 IMPLANT
ADH SRG 12 PREFL SYR 3 SPRDR (MISCELLANEOUS)
ADPR PRFSN 84XANTGRD RTRGD (ADAPTER) ×3
APPLICATOR COTTON TIP 6IN STRL (MISCELLANEOUS) IMPLANT
ATTRACTOMAT 16X20 MAGNETIC DRP (DRAPES) ×4 IMPLANT
BAG DECANTER FOR FLEXI CONT (MISCELLANEOUS) ×4 IMPLANT
BANDAGE ELASTIC 3 VELCRO ST LF (GAUZE/BANDAGES/DRESSINGS) ×4 IMPLANT
BANDAGE ELASTIC 4 VELCRO ST LF (GAUZE/BANDAGES/DRESSINGS) ×4 IMPLANT
BANDAGE ELASTIC 6 VELCRO ST LF (GAUZE/BANDAGES/DRESSINGS) ×4 IMPLANT
BANDAGE GAUZE ELAST BULKY 4 IN (GAUZE/BANDAGES/DRESSINGS) ×4 IMPLANT
BASKET HEART (ORDER IN 25'S) (MISCELLANEOUS) ×1
BASKET HEART (ORDER IN 25S) (MISCELLANEOUS) ×3 IMPLANT
BLADE STERNUM SYSTEM 6 (BLADE) ×4 IMPLANT
BLADE SURG 15 STRL LF DISP TIS (BLADE) ×3 IMPLANT
BLADE SURG 15 STRL SS (BLADE) ×4
CANISTER SUCTION 2500CC (MISCELLANEOUS) ×4 IMPLANT
CANNULA GUNDRY RCSP 15FR (MISCELLANEOUS) ×4 IMPLANT
CATH CPB KIT HENDRICKSON (MISCELLANEOUS) ×4 IMPLANT
CATH HEART VENT LEFT (CATHETERS) ×3 IMPLANT
CATH ROBINSON RED A/P 18FR (CATHETERS) ×8 IMPLANT
CATH THORACIC 36FR (CATHETERS) ×4 IMPLANT
CATH THORACIC 36FR RT ANG (CATHETERS) ×8 IMPLANT
CLIP FOGARTY SPRING 6M (CLIP) IMPLANT
CLIP TI MEDIUM 24 (CLIP) IMPLANT
CLIP TI WIDE RED SMALL 24 (CLIP) ×4 IMPLANT
CLOTH BEACON ORANGE TIMEOUT ST (SAFETY) ×4 IMPLANT
CONT SPEC 4OZ CLIKSEAL STRL BL (MISCELLANEOUS) ×12 IMPLANT
COVER SURGICAL LIGHT HANDLE (MISCELLANEOUS) ×8 IMPLANT
CRADLE DONUT ADULT HEAD (MISCELLANEOUS) ×4 IMPLANT
DRAPE CARDIOVASCULAR INCISE (DRAPES) ×4
DRAPE SLUSH MACHINE 52X66 (DRAPES) IMPLANT
DRAPE SLUSH/WARMER DISC (DRAPES) ×4 IMPLANT
DRAPE SRG 135X102X78XABS (DRAPES) ×3 IMPLANT
DRSG COVADERM 4X14 (GAUZE/BANDAGES/DRESSINGS) ×4 IMPLANT
ELECT REM PT RETURN 9FT ADLT (ELECTROSURGICAL) ×8
ELECTRODE REM PT RTRN 9FT ADLT (ELECTROSURGICAL) ×6 IMPLANT
GLOVE EUDERMIC 7 POWDERFREE (GLOVE) ×12 IMPLANT
GOWN PREVENTION PLUS XLARGE (GOWN DISPOSABLE) ×8 IMPLANT
GOWN STRL NON-REIN LRG LVL3 (GOWN DISPOSABLE) ×16 IMPLANT
HEMOSTAT POWDER SURGIFOAM 1G (HEMOSTASIS) ×12 IMPLANT
HEMOSTAT SURGICEL 2X14 (HEMOSTASIS) ×4 IMPLANT
INSERT FOGARTY XLG (MISCELLANEOUS) ×4 IMPLANT
KIT BASIN OR (CUSTOM PROCEDURE TRAY) ×4 IMPLANT
KIT ROOM TURNOVER OR (KITS) ×4 IMPLANT
KIT SUCTION CATH 14FR (SUCTIONS) ×8 IMPLANT
KIT VASOVIEW W/TROCAR VH 2000 (KITS) ×4 IMPLANT
LINE VENT (MISCELLANEOUS) ×4 IMPLANT
MARKER GRAFT CORONARY BYPASS (MISCELLANEOUS) ×12 IMPLANT
NS IRRIG 1000ML POUR BTL (IV SOLUTION) ×32 IMPLANT
PACK OPEN HEART (CUSTOM PROCEDURE TRAY) ×4 IMPLANT
PAD ARMBOARD 7.5X6 YLW CONV (MISCELLANEOUS) ×8 IMPLANT
PENCIL BUTTON HOLSTER BLD 10FT (ELECTRODE) ×4 IMPLANT
PUNCH AORTIC ROTATE 4.0MM (MISCELLANEOUS) IMPLANT
PUNCH AORTIC ROTATE 4.5MM 8IN (MISCELLANEOUS) ×4 IMPLANT
PUNCH AORTIC ROTATE 5MM 8IN (MISCELLANEOUS) IMPLANT
SET CARDIOPLEGIA MPS 5001102 (MISCELLANEOUS) ×4 IMPLANT
SPONGE GAUZE 4X4 12PLY (GAUZE/BANDAGES/DRESSINGS) ×8 IMPLANT
SPONGE LAP 4X18 X RAY DECT (DISPOSABLE) ×4 IMPLANT
SUT BONE WAX W31G (SUTURE) ×4 IMPLANT
SUT ETHIBON 2 0 V 52N 30 (SUTURE) ×8 IMPLANT
SUT ETHIBON EXCEL 2-0 V-5 (SUTURE) IMPLANT
SUT ETHIBOND 2 0 SH (SUTURE) ×4
SUT ETHIBOND 2 0 SH 36X2 (SUTURE) ×3 IMPLANT
SUT ETHIBOND 2 0 V4 (SUTURE) IMPLANT
SUT ETHIBOND 2 0V4 GREEN (SUTURE) IMPLANT
SUT ETHIBOND 4 0 RB 1 (SUTURE) IMPLANT
SUT ETHIBOND NAB MH 2-0 36IN (SUTURE) ×4 IMPLANT
SUT ETHIBOND V-5 VALVE (SUTURE) IMPLANT
SUT GORETEX CV-5THC-13 36IN (SUTURE) ×16 IMPLANT
SUT MNCRL AB 4-0 PS2 18 (SUTURE) IMPLANT
SUT PROLENE 3 0 SH 1 (SUTURE) IMPLANT
SUT PROLENE 3 0 SH DA (SUTURE) ×8 IMPLANT
SUT PROLENE 4 0 RB 1 (SUTURE) ×32
SUT PROLENE 4 0 SH DA (SUTURE) IMPLANT
SUT PROLENE 4-0 RB1 .5 CRCL 36 (SUTURE) ×24 IMPLANT
SUT PROLENE 6 0 C 1 30 (SUTURE) ×24 IMPLANT
SUT PROLENE 7 0 BV 1 (SUTURE) ×12 IMPLANT
SUT PROLENE 7 0 BV1 MDA (SUTURE) ×4 IMPLANT
SUT PROLENE 8 0 BV175 6 (SUTURE) IMPLANT
SUT SILK  1 MH (SUTURE) ×2
SUT SILK 1 MH (SUTURE) ×6 IMPLANT
SUT STEEL 6MS V (SUTURE) ×4 IMPLANT
SUT STEEL STERNAL CCS#1 18IN (SUTURE) IMPLANT
SUT STEEL SZ 6 DBL 3X14 BALL (SUTURE) ×4 IMPLANT
SUT VIC AB 1 CTX 36 (SUTURE) ×8
SUT VIC AB 1 CTX36XBRD ANBCTR (SUTURE) ×6 IMPLANT
SUT VIC AB 2-0 CT1 27 (SUTURE) ×4
SUT VIC AB 2-0 CT1 TAPERPNT 27 (SUTURE) ×3 IMPLANT
SUT VIC AB 2-0 CTX 27 (SUTURE) IMPLANT
SUT VIC AB 3-0 SH 27 (SUTURE)
SUT VIC AB 3-0 SH 27X BRD (SUTURE) IMPLANT
SUT VIC AB 3-0 X1 27 (SUTURE) ×4 IMPLANT
SUT VICRYL 4-0 PS2 18IN ABS (SUTURE) IMPLANT
SUTURE E-PAK OPEN HEART (SUTURE) ×4 IMPLANT
SYR 10ML KIT SKIN ADHESIVE (MISCELLANEOUS) IMPLANT
SYSTEM SAHARA CHEST DRAIN ATS (WOUND CARE) ×4 IMPLANT
TAPE CLOTH SOFT 2X10 (GAUZE/BANDAGES/DRESSINGS) ×4 IMPLANT
TOWEL OR 17X24 6PK STRL BLUE (TOWEL DISPOSABLE) ×8 IMPLANT
TOWEL OR 17X26 10 PK STRL BLUE (TOWEL DISPOSABLE) ×8 IMPLANT
TRAY FOLEY IC TEMP SENS 14FR (CATHETERS) ×4 IMPLANT
TUBE FEEDING 8FR 16IN STR KANG (MISCELLANEOUS) ×4 IMPLANT
TUBING INSUFFLATION 10FT LAP (TUBING) ×4 IMPLANT
UNDERPAD 30X30 INCONTINENT (UNDERPADS AND DIAPERS) ×4 IMPLANT
VALVE MAGNA EASE AORTIC 25MM (Prosthesis & Implant Heart) ×4 IMPLANT
VENT LEFT HEART 12002 (CATHETERS) ×4
WATER STERILE IRR 1000ML POUR (IV SOLUTION) ×8 IMPLANT

## 2012-08-02 NOTE — Interval H&P Note (Signed)
History and Physical Interval Note:  08/02/2012 7:12 AM  Roy [name]  has presented today for surgery, with the diagnosis of AI  The various methods of treatment have been discussed with the patient and family. After consideration of risks, benefits and other options for treatment, the patient has consented to  Procedure(s) (LRB) with comments: AORTIC VALVE REPLACEMENT (AVR) (N/A) INTRAOPERATIVE TRANSESOPHAGEAL ECHOCARDIOGRAM (N/A) CORONARY ARTERY BYPASS GRAFTING (CABG) (N/A) as a surgical intervention .  The patient's history has been reviewed, patient examined, no change in status, stable for surgery.  I have reviewed the patient's chart and labs.  Questions were answered to the patient's satisfaction.     [name]

## 2012-08-02 NOTE — H&P (Signed)
Reason for Consult:Aortic insufficiency  Referring Physician: Dr. Shirlee Latch [name]/ Roy [name] is an 52 y.o. male.  HPI: 52 yo WM with cc/o shortness of breath with exertion  52 yo WM with diabetes, hypertension, hyperlipidemia and known CAD. He had an MI in 6/13 treated with PTCA and stenting. He says there was another vessel that was "65%" that was not stented. He did well until December. He had a knee arthroscopy. Shortly following that he had a NQWMI. Repeat cath showed progression and another stent was placed. He has been on ASA and effient. He relates the onset of his SOB to the time of that second catheterization. He says that during the procedure he became acutely SOB and could not lie flat. He says that ever since then he has had trouble lying flat and gets SOB with moderate exertion. He has not had any leg swelling. He says he has been having some vague right sided CP 1-3/10, that is different from his MI pain.  He went to the Cardiologist to see about the SOB and was found to have a new diastolic murmur. An echocardiogram showed severe AI. 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 There was a question of a vegetation.  Past Medical History   Diagnosis  Date   .  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  Diabetes mellitus      type II   .  Hyperlipidemia    .  Hypertension    .  Melanoma    .  Heart attack  12-2011     [facility]   .  Shortness of breath     Past Surgical History   Procedure  Date   .  Rotator cuff repair      RIGHT   .  Knee arthoscopy      LEFT KNEE   .  Ulnar tunnel release      RIGHT   .  Keratotomies      BILATERAL   .  Septoplasty    .  Tonsillectomy    .  Knee arthroscopy  06/28/2012     Procedure: ARTHROSCOPY KNEE; Surgeon: Roy Huh, MD; Location: [facility] OR; Service: Orthopedics; Laterality: Left;   .  Cardiac catheterization  12-22-11      with OM1 Promus stent 12/22/11   .  Knee surgery      left   .  Eye surgery     Family History   Problem  Relation  Age of Onset   .  Coronary artery disease  Mother    .  Heart disease  Father    .  Heart attack  Paternal Grandfather    .  Lung cancer  Mother    .  Diabetes  Paternal Grandmother    .  Hyperlipidemia  Brother    .  Hyperlipidemia        father's side    .  Melanoma        fh    Social History: reports that he has never smoked. He has never used smokeless tobacco. He reports that he does not drink alcohol or use illicit drugs.  Allergies:  Allergies   Allergen  Reactions   .  Contrast Media (Iodinated Diagnostic Agents)  Other (See Comments)     Pt'shad streaks up and down his arms    Medications:  Prior  to Admission:  Prescriptions prior to admission   Medication  Sig  Dispense  Refill   .  albuterol (PROVENTIL HFA;VENTOLIN HFA) 108 (90 BASE) MCG/ACT inhaler  Inhale 2 puffs into the lungs every 4 (four) hours as needed for wheezing.  1 Inhaler  3   .  aspirin 81 MG tablet  Take 81 mg by mouth daily.     .  furosemide (LASIX) 20 MG tablet  Take 1 tablet (20 mg total) by mouth 2 (two) times daily.  60 tablet  11   .  metoprolol succinate (TOPROL XL) 25 MG 24 hr tablet  Take 2 tablets (50 mg total) by mouth daily.  60 tablet  3   .  NITROSTAT 0.4 MG SL tablet  Place 0.4 mg under the tongue every 5 (five) minutes as needed.     .  pioglitazone (ACTOS) 15 MG tablet  Take 15 mg by mouth daily.     .  prasugrel (EFFIENT) 10 MG TABS  Take 1 tablet (10 mg total) by mouth daily.  90 tablet  3   .  rosuvastatin (CRESTOR) 20 MG tablet  Take 1 tablet (20 mg total) by mouth daily.  90 tablet  3   .  sitaGLIPtan-metformin (JANUMET) 50-1000 MG per tablet  Take 1 tablet by mouth daily.      Results for orders placed during the hospital encounter of 07/28/12 (from the past 48 hour(s))   GLUCOSE, CAPILLARY Status: Abnormal    Collection Time    07/28/12 11:24 AM     Component  Value  Range  Comment    Glucose-Capillary  182 (*)  70 - 99 mg/dL     Comment 1  Documented in Chart      Comment 2  Notify RN     CBC WITH DIFFERENTIAL Status: Abnormal    Collection Time    07/28/12 2:30 PM   Component  Value  Range  Comment    WBC  5.0  4.0 - 10.5 K/uL     RBC  4.15 (*)  4.22 - 5.81 MIL/uL     Hemoglobin  12.3 (*)  13.0 - 17.0 g/dL     HCT  45.4 (*)  09.8 - 52.0 %     MCV  87.0  78.0 - 100.0 fL     MCH  29.6  26.0 - 34.0 pg     MCHC  34.1  30.0 - 36.0 g/dL     RDW  11.9  14.7 - 82.9 %     Platelets  96 (*)  150 - 400 K/uL  PLATELET COUNT CONFIRMED BY SMEAR    Neutrophils Relative  54  43 - 77 %     Neutro Abs  2.5  1.7 - 7.7 K/uL     Lymphocytes Relative  35  12 - 46 %     Lymphs Abs  1.6  0.7 - 4.0 K/uL     Monocytes Relative  9  3 - 12 %     Monocytes Absolute  0.4  0.1 - 1.0 K/uL     Eosinophils Relative  2  0 - 5 %     Eosinophils Absolute  0.1  0.0 - 0.7 K/uL     Basophils Relative  0  0 - 1 %     Basophils Absolute  0.0  0.0 - 0.1 K/uL    COMPREHENSIVE METABOLIC PANEL Status: Abnormal    Collection Time    07/28/12  2:30 PM   Component  Value  Range  Comment    Sodium  138  135 - 145 mEq/L     Potassium  4.1  3.5 - 5.1 mEq/L     Chloride  101  96 - 112 mEq/L     CO2  26  19 - 32 mEq/L     Glucose, Bld  119 (*)  70 - 99 mg/dL     BUN  21  6 - 23 mg/dL     Creatinine, Ser  1.61  0.50 - 1.35 mg/dL     Calcium  9.4  8.4 - 10.5 mg/dL     Total Protein  7.0  6.0 - 8.3 g/dL     Albumin  3.8  3.5 - 5.2 g/dL     AST  17  0 - 37 U/L     ALT  14  0 - 53 U/L     Alkaline Phosphatase  61  39 - 117 U/L     Total Bilirubin  0.5  0.3 - 1.2 mg/dL     GFR calc non Af Amer  77 (*)  >90 mL/min     GFR calc Af Amer  89 (*)  >90 mL/min    PRO B NATRIURETIC PEPTIDE Status: Abnormal    Collection Time    07/28/12 2:30 PM   Component  Value  Range  Comment    Pro B Natriuretic peptide (BNP)  1564.0 (*)  0 - 125 pg/mL    GLUCOSE, CAPILLARY Status: Abnormal     Collection Time    07/28/12 4:51 PM   Component  Value  Range  Comment    Glucose-Capillary  111 (*)  70 - 99 mg/dL     Comment 1  Documented in Chart      Comment 2  Notify RN      X-ray Chest Pa And Lateral  07/28/2012 *RADIOLOGY REPORT* Clinical Data: Cough and shortness of breath CHEST - 2 VIEW Comparison: June 23, 2012 Findings: Lungs clear. Heart size and pulmonary vascularity are normal. No adenopathy. No bone lesions. IMPRESSION: No abnormality noted. Original Report Authenticated By: Bretta Bang, M.D.   Review of Systems  Constitutional: Positive for fever (mild, low grade), chills (feels hot and cold intermittently) and malaise/fatigue.  HENT: Negative for neck pain.  Respiratory: Positive for shortness of breath (with exertion).  Cardiovascular: Positive for chest pain and orthopnea.  Gastrointestinal: Negative for nausea, vomiting and abdominal pain.  Musculoskeletal: Negative for myalgias and back pain.  Skin: Negative for itching and rash.  Neurological: Negative. Negative for headaches.  Endo/Heme/Allergies: Negative.  All other systems reviewed and are negative.   Blood pressure 96/43, pulse 78, temperature 98.6 F (37 C), temperature source Oral, resp. rate 22, height 5\' 9"  (1.753 m), weight 194 lb 0.1 oz (88 kg), SpO2 93.00%.  Physical Exam  Vitals reviewed.  Constitutional: He is oriented to person, place, and time. He appears well-developed and well-nourished. No distress.  HENT:  Head: Normocephalic and atraumatic.  Eyes: EOM are normal. Pupils are equal, round, and reactive to light.  Neck: Neck supple. No JVD present. No thyromegaly present.  Cardiovascular: Normal rate and regular rhythm. Exam reveals no gallop and no friction rub.  Murmur (2/6 diastolic) heard.  Respiratory: Effort normal. No respiratory distress. He has no wheezes. He has no rales.  GI: Soft. There is no tenderness.  Musculoskeletal: Normal range of motion. He exhibits no edema.   Lymphadenopathy:  He  has no cervical adenopathy.  Neurological: He is alert and oriented to person, place, and time. No cranial nerve deficit.  No focal motor deficits  Skin: Skin is warm and dry.   2D Echo 07/29/11  [facility] [address] [place], [place] [address] [phone]  ------------------------------------------------------------ Echocardiography  Patient: Calistro, Rauf MR #: [id number] Study Date: 07/28/2012 Gender: M Age: 46 Height: 175.3cm Weight: 91.2kg BSA: 2.60m^2 Pt. Status: Room:  ATTENDING Orvil Feil, Muhammad REFERRING [place], Muhammad PERFORMING [place] SONOGRAPHER Philomena Course, RDCS cc:  ------------------------------------------------------------ LV EF: 60% - 65%  ------------------------------------------------------------ Indications: Dyspnea 786.09.  ------------------------------------------------------------ History: PMH: Acquired from the patient and from the patient's chart. Chest pain. Dyspnea and murmur. Coronary artery disease. PMH: Myocardial infarction. Risk factors: Hypertension. Diabetes mellitus. Dyslipidemia.  ------------------------------------------------------------ Study Conclusions  - Left ventricle: The cavity size was normal. Wall thickness was normal. Systolic function was normal. The estimated ejection fraction was in the range of 60% to 65%. Wall motion was normal; there were no regional wall motion abnormalities. Doppler parameters are consistent with a restrictive pattern, indicative of decreased left ventricular diastolic compliance and/or increased left atrial pressure (grade 3 diastolic dysfunction). - Aortic valve: Severe regurgitation. - Mitral valve: Mild regurgitation. - Left atrium: The atrium was mildly to moderately dilated. - Atrial septum: No defect or patent foramen ovale was identified. - Pulmonary arteries: PA peak pressure: 43mm Hg  (S).  ------------------------------------------------------------ Labs, prior tests, procedures, and surgery: Catheterization (June 2013). There was a stenosis which was treated with a stent.  Echocardiography. M-mode, complete 2D, spectral Doppler, and color Doppler. Height: Height: 175.3cm. Height: 69in. Weight: Weight: 91.2kg. Weight: 200.6lb. Body mass index: BMI: 29.7kg/m^2. Body surface area: BSA: 2.33m^2. Blood pressure: 108/58. Patient status: Outpatient. Location: [facility]  ------------------------------------------------------------  ------------------------------------------------------------ Left ventricle: The cavity size was normal. Wall thickness was normal. Systolic function was normal. The estimated ejection fraction was in the range of 60% to 65%. Wall motion was normal; there were no regional wall motion abnormalities. Doppler parameters are consistent with a restrictive pattern, indicative of decreased left ventricular diastolic compliance and/or increased left atrial pressure (grade 3 diastolic dysfunction).  ------------------------------------------------------------ Aortic valve: Questionable flail aortic valve leaflet. Trileaflet. Doppler: Severe regurgitation.  ------------------------------------------------------------ Aorta: The aorta was normal, not dilated, and non-diseased. Ascending aorta: The ascending aorta was mildly dilated.  ------------------------------------------------------------ Mitral valve: Structurally normal valve. Leaflet separation was normal. Doppler: Transvalvular velocity was within the normal range. There was no evidence for stenosis. Mild regurgitation. Peak gradient: 6mm Hg (D).  ------------------------------------------------------------ Left atrium: The atrium was mildly to moderately dilated.  ------------------------------------------------------------ Atrial septum: No defect or patent foramen ovale  was identified.  ------------------------------------------------------------ Right ventricle: The cavity size was normal. Wall thickness was normal. Systolic function was normal.  ------------------------------------------------------------ Pulmonic valve: Structurally normal valve. Cusp separation was normal. Doppler: Transvalvular velocity was within the normal range. Trivial regurgitation.  ------------------------------------------------------------ Tricuspid valve: Structurally normal valve. Leaflet separation was normal. Doppler: Transvalvular velocity was within the normal range. Mild regurgitation.  ------------------------------------------------------------ Pulmonary artery: The main pulmonary artery was normal-sized.  ------------------------------------------------------------ Right atrium: The atrium was normal in size.  ------------------------------------------------------------ Pericardium: The pericardium was normal in appearance.  ------------------------------------------------------------ Systemic veins: Inferior vena cava: The vessel was normal in size; the respirophasic diameter changes were in the normal range (= 50%); findings are consistent with normal central venous pressure.  ------------------------------------------------------------ Post procedure conclusions Ascending Aorta:  - The aorta was normal, not dilated, and  non-diseased  Assessment/Plan:  52 yo WM with new onset AI. 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 His symptoms relate temporally to his cath in mid December. There is a question of a vegetation vs a flail [place] leaflet. He has had some hot and cold spells but no high grade fevers. Given the temporal association I suspect this was mechanical trauma at the time of cath, but blood cultures have been drawn and antibiotics started in case this is endocarditis.  In any event he will need surgery for his AI.  There is an outside chance that the valve may be repairable, but in all likelihood it will need to be replaced. I have not yet had the opportunity to review his cath films to [place] but it does appear from the report he has some disease in the RCA.  I discussed with the patient and his wife the indications, risks, benefits and alternatives to AVR. We discussed the general nature of the procedure, including the need for general anesthesia and cardiopulmonary bypass. He understands the risks include but are not limited to death, MI, stroke, DVT, PE, bleeding, possible need for transfusion, infection, other organ system dysfunction (resp,renal, GI), and possible heart block requiring a permanent pacemaker.  We also discussed options for valve replacement- mechanical v tissue and the relative advantages and disadvantages of each. He very strongly wants to avoid coumadin, but will think about it over the next few days.  He has been on effient and will need to be off it for 5 days prior to surgery.We are tenatively planning to proceed with AVR on Monday 1/13  Roy [name] C  07/28/2012, 6:57 PM

## 2012-08-02 NOTE — Procedures (Signed)
Extubation Procedure Note  Patient Details:   Name: Roy [name] DOB: 28-Oct-1960 MRN: [id number]   Airway Documentation:   Patient extubated to 4 lpm nasal cannula.  VC 1000 ml, NIF -40, patient able to hold head off bed 10 seconds.  Patient able to breathe around deflated cuff and vocalize post procedure.  Tolerated well, no complications.   Evaluation  O2 sats: stable throughout Complications: No apparent complications Patient did tolerate procedure well. Bilateral Breath Sounds: Clear;Diminished   Yes  Sherri Mcarthy, Aloha Gell 08/02/2012, 7:40 PM

## 2012-08-02 NOTE — Progress Notes (Signed)
  Echocardiogram Echocardiogram Transesophageal has been performed.  [place]ian [place] 08/02/2012, 10:45 AM

## 2012-08-02 NOTE — Preoperative (Signed)
Beta Blockers   Reason not to administer Beta Blockers:took metoprolol at 452 am

## 2012-08-02 NOTE — Transfer of Care (Signed)
Immediate Anesthesia Transfer of Care Note  Patient: Roy [name]  Procedure(s) Performed: Procedure(s) (LRB) with comments: AORTIC VALVE REPLACEMENT (AVR) (N/A) INTRAOPERATIVE TRANSESOPHAGEAL ECHOCARDIOGRAM (N/A) CORONARY ARTERY BYPASS GRAFTING (CABG) (N/A) ENDOVEIN HARVEST OF GREATER SAPHENOUS VEIN (Left) - upper and lower leg  Patient Location: SICU  Anesthesia Type:General  Level of Consciousness: sedated and unresponsive  Airway & Oxygen Therapy: Patient remains intubated per anesthesia plan and Patient placed on Ventilator (see vital sign flow sheet for setting)  Post-op Assessment: Post -op Vital signs reviewed and stable  Post vital signs: Reviewed and stable  Complications: No apparent anesthesia complications

## 2012-08-02 NOTE — Anesthesia Postprocedure Evaluation (Signed)
  Anesthesia Post-op Note  Patient: Roy [name]  Procedure(s) Performed: Procedure(s) (LRB) with comments: AORTIC VALVE REPLACEMENT (AVR) (N/A) INTRAOPERATIVE TRANSESOPHAGEAL ECHOCARDIOGRAM (N/A) CORONARY ARTERY BYPASS GRAFTING (CABG) (N/A) ENDOVEIN HARVEST OF GREATER SAPHENOUS VEIN (Left) - upper and lower leg  Patient Location: PACU  Anesthesia Type:General  Level of Consciousness: sedated and unresponsive  Airway and Oxygen Therapy: Patient remains intubated per anesthesia plan  Post-op Pain: none  Post-op Assessment: Post-op Vital signs reviewed, Patient's Cardiovascular Status Stable and Respiratory Function Stable  Post-op Vital Signs: Reviewed and stable  Complications: No apparent anesthesia complications

## 2012-08-02 NOTE — Progress Notes (Signed)
ANTIBIOTIC CONSULT NOTE - FOLLOW UP  Pharmacy Consult for Vancomycin Indication: endocarditis (rule-out)  Allergies  Allergen Reactions  . Contrast Media (Iodinated Diagnostic Agents) Other (See Comments)    Pt'shad streaks up and down his arms    Patient Measurements: Height: 5\' 9"  (175.3 cm) Weight: 192 lb 0.3 oz (87.1 kg) IBW/kg (Calculated) : 70.7   Vital Signs: Temp: 98 F (36.7 C) (01/13 0500) Temp src: Oral (01/13 0500) BP: 111/52 mmHg (01/13 0500) Intake/Output from previous day: 01/12 0701 - 01/13 0700 In: 2058.1 [P.O.:1364; I.V.:444.1; IV Piggyback:250] Out: -  Intake/Output from this shift: Total I/O In: 3898 [I.V.:3000; Blood:898] Out: 2300 [Urine:2300]  Labs:  Basename 08/02/12 1341 08/02/12 1253 08/02/12 1214 08/02/12 0530 08/01/12 0600 07/31/12 0618 07/31/12 0319  WBC -- -- -- 5.4 6.6 -- 5.0  HGB 7.1* 7.1* 7.9* -- -- -- --  PLT -- -- 81* 96* 91* -- --  LABCREA -- -- -- -- -- -- --  CREATININE -- -- -- 1.04 0.99 1.04 --   Estimated Creatinine Clearance: 92.9 ml/min (by C-G formula based on Cr of 1.04).  Basename 07/30/12 1714  VANCOTROUGH 19.8  VANCOPEAK --  VANCORANDOM --  GENTTROUGH --  GENTPEAK --  GENTRANDOM --  TOBRATROUGH --  TOBRAPEAK --  TOBRARND --  AMIKACINPEAK --  AMIKACINTROU --  AMIKACIN --     Microbiology: Recent Results (from the past 720 hour(s))  CULTURE, BLOOD (ROUTINE X 2)     Status: Normal (Preliminary result)   Collection Time   07/28/12  2:15 PM      Component Value Range Status Comment   Specimen Description BLOOD RIGHT ARM   Final    Special Requests BOTTLES DRAWN AEROBIC ONLY 10CC   Final    Culture  Setup Time 07/28/2012 22:33   Final    Culture     Final    Value:        BLOOD CULTURE RECEIVED NO GROWTH TO DATE CULTURE WILL BE HELD FOR 5 DAYS BEFORE ISSUING A FINAL NEGATIVE REPORT   Report Status PENDING   Incomplete   CULTURE, BLOOD (ROUTINE X 2)     Status: Normal (Preliminary result)   Collection Time   07/28/12  2:20 PM      Component Value Range Status Comment   Specimen Description BLOOD RIGHT HAND   Final    Special Requests BOTTLES DRAWN AEROBIC ONLY 10CC   Final    Culture  Setup Time 07/28/2012 22:33   Final    Culture     Final    Value:        BLOOD CULTURE RECEIVED NO GROWTH TO DATE CULTURE WILL BE HELD FOR 5 DAYS BEFORE ISSUING A FINAL NEGATIVE REPORT   Report Status PENDING   Incomplete   CULTURE, BLOOD (ROUTINE X 2)     Status: Normal (Preliminary result)   Collection Time   07/28/12  6:32 PM      Component Value Range Status Comment   Specimen Description BLOOD RIGHT HAND   Final    Special Requests BOTTLES DRAWN AEROBIC ONLY 10CC   Final    Culture  Setup Time 07/29/2012 00:47   Final    Culture     Final    Value:        BLOOD CULTURE RECEIVED NO GROWTH TO DATE CULTURE WILL BE HELD FOR 5 DAYS BEFORE ISSUING A FINAL NEGATIVE REPORT   Report Status PENDING   Incomplete   MRSA PCR SCREENING  Status: Normal   Collection Time   07/30/12  2:15 PM      Component Value Range Status Comment   MRSA by PCR NEGATIVE  NEGATIVE Final   URINE CULTURE     Status: Normal   Collection Time   07/31/12  1:33 AM      Component Value Range Status Comment   Specimen Description URINE, RANDOM   Final    Special Requests NONE   Final    Culture  Setup Time 07/31/2012 12:46   Final    Colony Count NO GROWTH   Final    Culture NO GROWTH   Final    Report Status 08/01/2012 FINAL   Final   SURGICAL PCR SCREEN     Status: Normal   Collection Time   08/01/12  9:45 PM      Component Value Range Status Comment   MRSA, PCR NEGATIVE  NEGATIVE Final    Staphylococcus aureus NEGATIVE  NEGATIVE Final     Anti-infectives     Start     Dose/Rate Route Frequency Ordered Stop   08/02/12 0800   cefUROXime (ZINACEF) 750 mg in dextrose 5 % 50 mL IVPB  Status:  Discontinued        750 mg 100 mL/hr over 30 Minutes Intravenous To Surgery 08/01/12 1935 08/02/12 1331   08/02/12 0400   vancomycin (VANCOCIN)  1,250 mg in sodium chloride 0.9 % 250 mL IVPB  Status:  Discontinued        1,250 mg 166.7 mL/hr over 90 Minutes Intravenous To Surgery 08/01/12 0854 08/02/12 1331   08/02/12 0400   cefUROXime (ZINACEF) 1.5 g in dextrose 5 % 50 mL IVPB        1.5 g 100 mL/hr over 30 Minutes Intravenous To Surgery 08/01/12 0854 08/02/12 1329   08/02/12 0400   cefUROXime (ZINACEF) 750 mg in dextrose 5 % 50 mL IVPB  Status:  Discontinued        750 mg 100 mL/hr over 30 Minutes Intravenous To Surgery 08/01/12 0854 08/01/12 1935   07/28/12 1800   vancomycin (VANCOCIN) 1,250 mg in sodium chloride 0.9 % 250 mL IVPB        1,250 mg 166.7 mL/hr over 90 Minutes Intravenous Every 12 hours 07/28/12 1631            Assessment: 52 yo M with severe aortic insufficiency, CAD, and possible endocarditis continues on Vancomycin day#6.  Pt is s/p CABG x 3 and aortic valve replacement.  Renal function remains stable.  Vancomycin trough 19.8 on this regimen.  Goal of Therapy:  Vancomycin trough level 15-20 mcg/ml  Plan:  Continue Vancomycin at 1250 mg IV q12h. Follow-up LOT since endocarditis diagnosis is not "definative" Check Vancomycin trough weekly if therapy continues.  [employer], Pharm.D., BCPS Clinical Pharmacist Pager [phone] 08/02/2012 2:21 PM

## 2012-08-02 NOTE — Anesthesia Preprocedure Evaluation (Addendum)
Anesthesia Evaluation  Patient identified by MRN, date of birth, ID band Patient awake    Reviewed: Allergy & Precautions, H&P , NPO status , Patient's Chart, lab work & pertinent test results, reviewed documented beta blocker date and time   Airway Mallampati: II  Neck ROM: Full    Dental  (+) Teeth Intact   Pulmonary  breath sounds clear to auscultation        Cardiovascular hypertension, Pt. on home beta blockers + CAD and + Past MI Rhythm:Regular Rate:Normal     Neuro/Psych    GI/Hepatic   Endo/Other  diabetes, Type 2  Renal/GU      Musculoskeletal   Abdominal   Peds  Hematology   Anesthesia Other Findings   Reproductive/Obstetrics                          Anesthesia Physical Anesthesia Plan  ASA: III  Anesthesia Plan: General   Post-op Pain Management:    Induction: Intravenous  Airway Management Planned: Oral ETT  Additional Equipment: Arterial line, CVP, 3D TEE, PA Cath and Ultrasound Guidance Line Placement  Intra-op Plan:   Post-operative Plan: Post-operative intubation/ventilation  Informed Consent: I have reviewed the patients History and Physical, chart, labs and discussed the procedure including the risks, benefits and alternatives for the proposed anesthesia with the patient or authorized representative who has indicated his/her understanding and acceptance.   Dental advisory given  Plan Discussed with: CRNA, Anesthesiologist and Surgeon  Anesthesia Plan Comments:         Anesthesia Quick Evaluation

## 2012-08-02 NOTE — Progress Notes (Signed)
Patient transported via stretcher with nurse in attendance to OR.

## 2012-08-02 NOTE — Progress Notes (Signed)
Day of Surgery  Objective: AVR-CABG Stable on vent Vital signs in last 24 hours: Temp:  [96.8 F (36 C)-98.2 F (36.8 C)] 97.3 F (36.3 C) (01/13 1845) Pulse Rate:  [90-91] 90  (01/13 1850) Cardiac Rhythm:  [-] Normal sinus rhythm;Atrial paced (01/13 1500) Resp:  [10-20] 17  (01/13 1850) BP: (87-115)/(46-70) 107/64 mmHg (01/13 1850) SpO2:  [96 %-100 %] 100 % (01/13 1850) FiO2 (%):  [40 %-50 %] 40 % (01/13 1850) Weight:  [192 lb 0.3 oz (87.1 kg)] 192 lb 0.3 oz (87.1 kg) (01/13 1445)  Hemodynamic parameters for last 24 hours: PAP: (23-39)/(14-26) 39/26 mmHg CO:  [3.2 L/min-3.8 L/min] 3.8 L/min CI:  [1.6 L/min/m2-1.9 L/min/m2] 1.9 L/min/m2  Intake/Output from previous day: 01/12 0701 - 01/13 0700 In: 2058.1 [P.O.:1364; I.V.:444.1; IV Piggyback:250] Out: -  Intake/Output this shift:    Not bleeding- Hb 8.9  Lab Results:  Basename 08/02/12 1500 08/02/12 1441 08/02/12 1214 08/02/12 0530  WBC 6.4 -- -- 5.4  HGB 8.9* 9.5* -- --  HCT 25.2* 28.0* -- --  PLT 92* -- 81* --   BMET:  Basename 08/02/12 1441 08/02/12 1341 08/02/12 0530 08/01/12 0600  NA 140 139 -- --  K 4.1 4.2 -- --  CL -- -- 99 100  CO2 -- -- 28 26  GLUCOSE 105* 143* -- --  BUN -- -- 27* 27*  CREATININE -- -- 1.04 0.99  CALCIUM -- -- 9.1 9.1    PT/INR:  Basename 08/02/12 1500  LABPROT 17.0*  INR 1.42   ABG    Component Value Date/Time   PHART 7.417 08/02/2012 1445   HCO3 24.2* 08/02/2012 1445   TCO2 25 08/02/2012 1445   ACIDBASEDEF 1.0 08/02/2012 0951   O2SAT 93.0 08/02/2012 1445   CBG (last 3)   Basename 08/02/12 1819 08/02/12 1715 08/02/12 1610  GLUCAP 105* 102* 96    Assessment/Plan: S/P Procedure(s) (LRB): AORTIC VALVE REPLACEMENT (AVR) (N/A) INTRAOPERATIVE TRANSESOPHAGEAL ECHOCARDIOGRAM (N/A) CORONARY ARTERY BYPASS GRAFTING (CABG) (N/A) ENDOVEIN HARVEST OF GREATER SAPHENOUS VEIN (Left) Extubate when criteria met   LOS: 5 days    VAN TRIGT III,Brian Zeitlin 08/02/2012

## 2012-08-02 NOTE — Progress Notes (Signed)
Patient took shower and used chlorhexidine soap.  After shower, patient was wiped off with 6 chlorhexidine cloths per protocol.

## 2012-08-02 NOTE — Plan of Care (Signed)
Problem: Phase I Progression Outcomes Goal: Last BM documented (see I&O) Outcome: Completed/Met Date Met:  08/02/12 Last BM on 08/01/2012 at 1700.

## 2012-08-02 NOTE — OR Nursing (Signed)
Surgical information and SICU (1st) called.

## 2012-08-02 NOTE — Progress Notes (Signed)
AM chlorhexidine prep completed after hair clip performed.  Patient's hair was previously clipped, so little hair remained.  Patient voided.  Valium and Metoprolol were given.  Family visits at bedside.

## 2012-08-02 NOTE — Brief Op Note (Addendum)
                   [address]            [place] [address]          [phone]    07/28/2012 - 08/02/2012  12:43 PM  PATIENT:  Roy [name]  51 y.o. male  PRE-OPERATIVE DIAGNOSIS:  Aortic insufficiency, coronary artery dissection  POST-OPERATIVE DIAGNOSIS:  Aortic insufficiency, coronary artery dissection  PROCEDURE:  Procedure(s): AORTIC VALVE REPLACEMENT (AVR)# 25 [place] BIOPROSTHETIC INTRAOPERATIVE TRANSESOPHAGEAL ECHOCARDIOGRAM CORONARY ARTERY BYPASS GRAFTING (CABG)X3 LIMA-LAD; SEQ SVG-OM1-OM2 ENDOVEIN HARVEST OF GREATER SAPHENOUS VEIN LEFT THIGH  SURGEON:  Surgeon(s): Loreli Slot, MD  PHYSICIAN ASSISTANT: Gershon Crane PA-C  ANESTHESIA:   general  PATIENT CONDITION:  ICU - intubated and hemodynamically stable.  PRE-OPERATIVE WEIGHT: 87kg   COMPLICATIONS: NO KNOWN  XC= 156 min CPB 208 min  FINDINGS: good targets, good conduits trileaflet aortic valve with tear along 2/3 of free edge of left leaflet and tear along 1/2 of free edge of noncoronary leaflet. Intimal tear extending from L/Wilber commissure into left main. Unable to repair aortic valve.

## 2012-08-02 NOTE — OR Nursing (Signed)
2nd call to SICU charge nurse.

## 2012-08-03 ENCOUNTER — Inpatient Hospital Stay (HOSPITAL_COMMUNITY): Payer: BC Managed Care – PPO

## 2012-08-03 ENCOUNTER — Encounter (HOSPITAL_COMMUNITY): Payer: Self-pay | Admitting: Thoracic Surgery (Cardiothoracic Vascular Surgery)

## 2012-08-03 LAB — BASIC METABOLIC PANEL
BUN: 19 mg/dL (ref 6–23)
Calcium: 7.9 mg/dL — ABNORMAL LOW (ref 8.4–10.5)
GFR calc Af Amer: >90 mL/min (ref >90)
GFR calc non Af Amer: >90 mL/min (ref >90)
Glucose, Bld: 127 mg/dL — ABNORMAL HIGH (ref 70–99)
Potassium: 4.4 mEq/L (ref 3.5–5.1)

## 2012-08-03 LAB — GLUCOSE, CAPILLARY
Glucose-Capillary: 124 mg/dL — ABNORMAL HIGH (ref 70–99)
Glucose-Capillary: 119 mg/dL — ABNORMAL HIGH (ref 70–99)
Glucose-Capillary: 164 mg/dL — ABNORMAL HIGH (ref 70–99)
Glucose-Capillary: 98 mg/dL (ref 70–99)
Glucose-Capillary: 107 mg/dL — ABNORMAL HIGH (ref 70–99)
Glucose-Capillary: 107 mg/dL — ABNORMAL HIGH (ref 70–99)
Glucose-Capillary: 130 mg/dL — ABNORMAL HIGH (ref 70–99)
Glucose-Capillary: 135 mg/dL — ABNORMAL HIGH (ref 70–99)
Glucose-Capillary: 142 mg/dL — ABNORMAL HIGH (ref 70–99)

## 2012-08-03 LAB — CBC
HCT: 20.9 % — ABNORMAL LOW (ref 39.0–52.0)
Hemoglobin: 7.5 g/dL — ABNORMAL LOW (ref 13.0–17.0)
MCH: 30.6 pg (ref 26.0–34.0)
MCH: 30.8 pg (ref 26.0–34.0)
MCHC: 35.9 g/dL (ref 30.0–36.0)
Platelets: 76 10*3/uL — ABNORMAL LOW (ref 150–400)
RBC: 2.6 MIL/uL — ABNORMAL LOW (ref 4.22–5.81)
RDW: 13.9 % (ref 11.5–15.5)
RDW: 14.3 % (ref 11.5–15.5)

## 2012-08-03 LAB — POCT I-STAT, CHEM 8
Creatinine, Ser: 1 mg/dL (ref 0.50–1.35)
Glucose, Bld: 142 mg/dL — ABNORMAL HIGH (ref 70–99)
Hemoglobin: 7.1 g/dL — ABNORMAL LOW (ref 13.0–17.0)
Sodium: 138 mEq/L (ref 135–145)
TCO2: 27 mmol/L (ref 0–100)

## 2012-08-03 LAB — PREPARE PLATELET PHERESIS: Unit division: 0

## 2012-08-03 LAB — CREATININE, SERUM: Creatinine, Ser: 0.95 mg/dL (ref 0.50–1.35)

## 2012-08-03 LAB — CULTURE, BLOOD (ROUTINE X 2): Culture: NO GROWTH

## 2012-08-03 MED ORDER — METOPROLOL TARTRATE 25 MG PO TABS
25.0000 mg | ORAL_TABLET | Freq: Two times a day (BID) | ORAL | Status: DC
  Administered 2012-08-03 – 2012-08-07 (×9): 25 mg via ORAL
  Filled 2012-08-03 (×10): qty 1

## 2012-08-03 MED ORDER — INSULIN ASPART 100 UNIT/ML ~~LOC~~ SOLN
0.0000 [IU] | SUBCUTANEOUS | Status: DC
  Administered 2012-08-03 (×2): 2 [IU] via SUBCUTANEOUS
  Administered 2012-08-04: 4 [IU] via SUBCUTANEOUS

## 2012-08-03 MED ORDER — SITAGLIPTIN PHOS-METFORMIN HCL 50-1000 MG PO TABS: 1.0000 | ORAL_TABLET | Freq: Every day | ORAL | Status: DC

## 2012-08-03 MED ORDER — PIOGLITAZONE HCL 15 MG PO TABS
15.0000 mg | ORAL_TABLET | Freq: Every day | ORAL | Status: DC
  Administered 2012-08-03 – 2012-08-07 (×5): 15 mg via ORAL
  Filled 2012-08-03 (×5): qty 1

## 2012-08-03 MED ORDER — LINAGLIPTIN 5 MG PO TABS
5.0000 mg | ORAL_TABLET | Freq: Every day | ORAL | Status: DC
  Administered 2012-08-03 – 2012-08-07 (×5): 5 mg via ORAL
  Filled 2012-08-03 (×5): qty 1

## 2012-08-03 MED ORDER — INSULIN DETEMIR 100 UNIT/ML ~~LOC~~ SOLN
25.0000 [IU] | Freq: Every day | SUBCUTANEOUS | Status: DC
  Administered 2012-08-03 – 2012-08-06 (×4): 25 [IU] via SUBCUTANEOUS
  Filled 2012-08-03: qty 10

## 2012-08-03 MED ORDER — FUROSEMIDE 10 MG/ML IJ SOLN
40.0000 mg | Freq: Once | INTRAMUSCULAR | Status: AC
  Administered 2012-08-03: 40 mg via INTRAVENOUS

## 2012-08-03 MED ORDER — PRASUGREL HCL 10 MG PO TABS
10.0000 mg | ORAL_TABLET | Freq: Every day | ORAL | Status: DC
  Administered 2012-08-03 – 2012-08-04 (×2): 10 mg via ORAL
  Filled 2012-08-03 (×3): qty 1

## 2012-08-03 MED ORDER — METFORMIN HCL 500 MG PO TABS
1000.0000 mg | ORAL_TABLET | Freq: Every day | ORAL | Status: DC
  Administered 2012-08-03 – 2012-08-07 (×5): 1000 mg via ORAL
  Filled 2012-08-03 (×7): qty 2

## 2012-08-03 MED FILL — Magnesium Sulfate Inj 50%: INTRAMUSCULAR | Qty: 10 | Status: AC

## 2012-08-03 MED FILL — Potassium Acetate Inj 2 mEq/ML: INTRAVENOUS | Qty: 40 | Status: AC

## 2012-08-03 NOTE — Progress Notes (Signed)
INFECTIOUS DISEASE PROGRESS NOTE  ID: Roy [name] is a 52 y.o. male with  Active Problems:  * No active hospital problems. *   Subjective: Chest pain  Abtx:  Anti-infectives     Start     Dose/Rate Route Frequency Ordered Stop   08/02/12 2200   cefUROXime (ZINACEF) 1.5 g in dextrose 5 % 50 mL IVPB        1.5 g 100 mL/hr over 30 Minutes Intravenous Every 12 hours 08/02/12 1423 08/04/12 2159   08/02/12 2000   vancomycin (VANCOCIN) IVPB 1000 mg/200 mL premix        1,000 mg 200 mL/hr over 60 Minutes Intravenous  Once 08/02/12 1424 08/02/12 2046   08/02/12 0800   cefUROXime (ZINACEF) 750 mg in dextrose 5 % 50 mL IVPB  Status:  Discontinued        750 mg 100 mL/hr over 30 Minutes Intravenous To Surgery 08/01/12 1935 08/02/12 1331   08/02/12 0400   vancomycin (VANCOCIN) 1,250 mg in sodium chloride 0.9 % 250 mL IVPB  Status:  Discontinued        1,250 mg 166.7 mL/hr over 90 Minutes Intravenous To Surgery 08/01/12 0854 08/02/12 1331   08/02/12 0400   cefUROXime (ZINACEF) 1.5 g in dextrose 5 % 50 mL IVPB        1.5 g 100 mL/hr over 30 Minutes Intravenous To Surgery 08/01/12 0854 08/02/12 1329   08/02/12 0400   cefUROXime (ZINACEF) 750 mg in dextrose 5 % 50 mL IVPB  Status:  Discontinued        750 mg 100 mL/hr over 30 Minutes Intravenous To Surgery 08/01/12 0854 08/01/12 1935   07/28/12 1800   vancomycin (VANCOCIN) 1,250 mg in sodium chloride 0.9 % 250 mL IVPB  Status:  Discontinued        1,250 mg 166.7 mL/hr over 90 Minutes Intravenous Every 12 hours 07/28/12 1631 08/02/12 1424          Medications:  Scheduled:   . acetaminophen  1,000 mg Oral Q6H   Or  . acetaminophen (TYLENOL) oral liquid 160 mg/5 mL  975 mg Per Tube Q6H  . aspirin EC  325 mg Oral Daily   Or  . aspirin  324 mg Per Tube Daily  . atorvastatin  40 mg Oral q1800  . bisacodyl  10 mg Oral Daily   Or  . bisacodyl  10 mg Rectal Daily  . cefUROXime (ZINACEF)  IV  1.5 g Intravenous Q12H  . docusate  sodium  200 mg Oral Daily  . furosemide  40 mg Intravenous Once  . insulin aspart  0-24 Units Subcutaneous Q4H  . insulin detemir  25 Units Subcutaneous Q1200  . insulin regular  0-10 Units Intravenous TID WC  . linagliptin  5 mg Oral Daily  . metFORMIN  1,000 mg Oral Q breakfast  . metoprolol tartrate  25 mg Oral BID  . pantoprazole  40 mg Oral Daily  . pioglitazone  15 mg Oral Daily  . prasugrel  10 mg Oral Daily  . sodium chloride  3 mL Intravenous Q12H    Objective: Vital signs in last 24 hours: Temp:  [96.8 F (36 C)-100.2 F (37.9 C)] 99.7 F (37.6 C) (01/14 0830) Pulse Rate:  [84-94] 94  (01/14 0830) Resp:  [10-32] 27  (01/14 0830) BP: (83-132)/(50-74) 132/74 mmHg (01/14 0830) SpO2:  [95 %-100 %] 97 % (01/14 0700) Arterial Line BP: (94-137)/(54-74) 137/74 mmHg (01/14 0830) FiO2 (%):  [  40 %-50 %] 40 % (01/13 1850) Weight:  [87.1 kg (192 lb 0.3 oz)-92 kg (202 lb 13.2 oz)] 92 kg (202 lb 13.2 oz) (01/14 0500)   General appearance: alert, cooperative and no distress Resp: clear to auscultation bilaterally Cardio: regular rate and rhythm GI: normal findings: bowel sounds normal and soft, non-tender  Lab Results  Basename 08/03/12 0400 08/02/12 2107 08/02/12 2100 08/02/12 0530  WBC 6.7 -- 7.3 --  HGB 7.5* 7.1* -- --  HCT 20.9* 21.0* -- --  NA 137 139 -- --  K 4.4 4.5 -- --  CL 103 105 -- --  CO2 24 -- -- 28  BUN 19 21 -- --  CREATININE 0.91 0.90 -- --  GLU -- -- -- --   Liver Panel No results found for this basename: PROT:2,ALBUMIN:2,AST:2,ALT:2,ALKPHOS:2,BILITOT:2,BILIDIR:2,IBILI:2 in the last 72 hours Sedimentation Rate No results found for this basename: ESRSEDRATE in the last 72 hours C-Reactive Protein No results found for this basename: CRP:2 in the last 72 hours  Microbiology: Recent Results (from the past 240 hour(s))  CULTURE, BLOOD (ROUTINE X 2)     Status: Normal   Collection Time   07/28/12  2:15 PM      Component Value Range Status Comment    Specimen Description BLOOD RIGHT ARM   Final    Special Requests BOTTLES DRAWN AEROBIC ONLY 10CC   Final    Culture  Setup Time 07/28/2012 22:33   Final    Culture NO GROWTH 5 DAYS   Final    Report Status 08/03/2012 FINAL   Final   CULTURE, BLOOD (ROUTINE X 2)     Status: Normal   Collection Time   07/28/12  2:20 PM      Component Value Range Status Comment   Specimen Description BLOOD RIGHT HAND   Final    Special Requests BOTTLES DRAWN AEROBIC ONLY 10CC   Final    Culture  Setup Time 07/28/2012 22:33   Final    Culture NO GROWTH 5 DAYS   Final    Report Status 08/03/2012 FINAL   Final   CULTURE, BLOOD (ROUTINE X 2)     Status: Normal (Preliminary result)   Collection Time   07/28/12  6:32 PM      Component Value Range Status Comment   Specimen Description BLOOD RIGHT HAND   Final    Special Requests BOTTLES DRAWN AEROBIC ONLY 10CC   Final    Culture  Setup Time 07/29/2012 00:47   Final    Culture     Final    Value:        BLOOD CULTURE RECEIVED NO GROWTH TO DATE CULTURE WILL BE HELD FOR 5 DAYS BEFORE ISSUING A FINAL NEGATIVE REPORT   Report Status PENDING   Incomplete   MRSA PCR SCREENING     Status: Normal   Collection Time   07/30/12  2:15 PM      Component Value Range Status Comment   MRSA by PCR NEGATIVE  NEGATIVE Final   URINE CULTURE     Status: Normal   Collection Time   07/31/12  1:33 AM      Component Value Range Status Comment   Specimen Description URINE, RANDOM   Final    Special Requests NONE   Final    Culture  Setup Time 07/31/2012 12:46   Final    Colony Count NO GROWTH   Final    Culture NO GROWTH   Final  Report Status 08/01/2012 FINAL   Final   SURGICAL PCR SCREEN     Status: Normal   Collection Time   08/01/12  9:45 PM      Component Value Range Status Comment   MRSA, PCR NEGATIVE  NEGATIVE Final    Staphylococcus aureus NEGATIVE  NEGATIVE Final   TISSUE CULTURE     Status: Normal (Preliminary result)   Collection Time   08/02/12 10:55 AM       Component Value Range Status Comment   Specimen Description TISSUE   Final    Special Requests LEFT MAIN INTIMAL FLAP NO 1    Final    Gram Stain     Final    Value: NO WBC SEEN     NO ORGANISMS SEEN   Culture NO GROWTH 1 DAY   Final    Report Status PENDING   Incomplete   TISSUE CULTURE     Status: Normal (Preliminary result)   Collection Time   08/02/12 11:58 AM      Component Value Range Status Comment   Specimen Description TISSUE   Final    Special Requests AORTIC HEART VALVE LEAFLETS NO2   Final    Gram Stain     Final    Value: NO WBC SEEN     NO ORGANISMS SEEN   Culture NO GROWTH 1 DAY   Final    Report Status PENDING   Incomplete     Studies/Results: Dg Chest Portable 1 View In Am  08/03/2012  *RADIOLOGY REPORT*  Clinical Data: Post cardiac surgery  PORTABLE CHEST - 1 VIEW  Comparison: Portable exam 0634 hours compared to 08/02/2038  Findings: Endotracheal and nasogastric tube no longer identified. Right jugular Swan-Ganz catheter with tip projecting over proximal right pulmonary artery unchanged. Mediastinal drain and left thoracostomy tube again noted. Enlargement of cardiac silhouette post CABG and AVR. Pulmonary vascular congestion. Perihilar edema. Low lung volumes with bibasilar atelectasis. No pneumothorax.  IMPRESSION: Slightly increased bibasilar atelectasis.   Original Report Authenticated By: Ulyses Southward, M.D.    Dg Chest Portable 1 View  08/02/2012  *RADIOLOGY REPORT*  Clinical Data: Post CABG  PORTABLE CHEST - 1 VIEW  Comparison: 07/31/2012  Findings:  Minimal, likely expected, widening of the mediastinum post median sternotomy, CABG and aortic valve replacement.  Grossly unchanged cardiac silhouette given slightly decreased lung volumes.  Endotracheal tube overlies tracheal air column with tip above the carina. Right jugular approach PA catheter tip overlies the main pulmonary artery outflow tract. Enteric tube tip and side port project below left hemidiaphragm.   Mediastinal drain and left-sided chest tube.  No definite pneumothorax.  Mild cephalization of flow without frank evidence of edema. Worsening bibasilar opacities, left greater than right.  Query trace bilateral effusions.  Unchanged bones.  IMPRESSION: 1.  Appropriately positioned support apparatus as above.  No pneumothorax. 2.  Pulmonary venous congestion without frank evidence of edema. Trace bilateral effusions are suspected. 3.  Decreased lung volumes with worsening basilar opacities favored to represent atelectasis.   Original Report Authenticated By: Tacey Ruiz, MD      Assessment/Plan: AI  ? AV mass on TEE  He was being treated empirically for endocarditis. His Ao valve did not show mass at surgery, his gram stain did not show WBC or bacteria. He has Cx pending and his valve is in path for eval.  Agree with holding anbx for now. Await studies.   [name] Infectious Diseases [phone] 08/03/2012, 11:02 AM  LOS: 6 days

## 2012-08-03 NOTE — Op Note (Signed)
[name], Roy [name]                ACCOUNT NO.:  [id number]  MEDICAL RECORD NO.:  [id number]  LOCATION:  2316                         FACILITY:  [facility]  PHYSICIAN:  Salvatore Decent. 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  PROCEDURE:  Median sternotomy, extracorporeal circulation, aortic valve replacement with 25 mm Edwards Magna EASE bovine pericardial tissue valve (model #330 TFX, serial [id number]), coronary bypass grafting x3 (left internal mammary artery to LAD, a sequential saphenous vein graft to obtuse marginals 1 and 2), endoscopic vein harvest left thigh.  SURGEON:  Salvatore Decent. Dorris Fetch, M.D.  ASSISTANT:  Rowe Clack, P.A.-C.  ANESTHESIA:  General.  FINDINGS:  Prebypass TEE showed severe aortic insufficiency, postbypass TEE showed good function of the prosthetic valve with no perivalvular leaks. Tricuspid aortic valve with tear along the free edge of both the left and noncoronary cusp involving two-thirds of the length of the left leaflet and one-half of the length of the noncoronary free edge, unable to repair valve.  Good conduits.  Good targets.  CLINICAL NOTE:  Mr. Roy [name] is a 52 year old gentleman who had an MI in June 2013 and was treated with angioplasty and stenting.  In December, he had a second event, which was a non-Q-wave MI.  He underwent cardiac catheterization and during that procedure, he was found to have a totally occluded left circumflex that was opened with a stent.  During the procedure, he became acutely short of breath.   He stabilized and was discharged; however, he returned with a complaint of continued ongoing shortness of breath and was found to have a new diastolic murmur.  An echocardiogram showed severe aortic insufficiency.  There was a question of a vegetation.  He was started on antibiotics.  Repeat cardiac catheterization showed a filling defect in the distal left main extending into the origin of the left circumflex.  The patient was advised to undergo bypass grafting of the circumflex vessels as well as the LAD at the time of aortic valve replacement.  The indications, risks, benefits, and alternatives were discussed in detail with the patient.  He understood and accepted the risks and agreed to proceed.  OPERATIVE NOTE:  Mr. Roy [name] was brought to the preoperative holding area on August 02, 2012, where Anesthesia placed a Swan-Ganz catheter and arterial blood pressure monitoring line.  He was taken to the operating room, anesthetized, and intubated.  A Foley catheter was placed.  Of note, his initial cardiac output was 2.1 with an index of barely greater than 1.  The chest, abdomen, and legs were prepped and draped in usual sterile fashion.  Transesophageal echocardiography was performed. Please see separately dictated note for full details, but he did have severe aortic insufficiency as noted.  There also was some flail component attached to the noncoronary leaflet.  A median sternotomy was performed and the left internal mammary artery was harvested using standard technique.  Simultaneously, an incision was made in the medial aspect of the left leg at the level of the knee.  The greater saphenous vein was identified and was harvested endoscopically. Both saphenous vein and mammary artery were good quality conduit.  5000 units of heparin was administered during the vessel harvest. The remainder of the heparin dose was given prior to opening the pericardium.  After harvesting the  conduits, the pericardium was opened.  The ascending aorta was inspected.  It was normal size and had no palpable atherosclerotic disease.  The aorta was cannulated via concentric 2-0 Ethibond pledgeted pursestring sutures after confirming adequate anticoagulation with ACT measurement.  The dual stage venous cannula was placed via a pursestring suture in the right atrial appendage. Cardiopulmonary bypass was instituted and the patient was cooled to 32 degrees Celsius.  The coronary arteries were inspected and anastomotic sites were chosen.  The conduits were inspected and cut to length.  A foam pad was placed in the pericardium to protect the left phrenic nerve and insulate the heart.  A temperature probe was placed in myocardial septum and the left ventricular vent was placed via a pursestring suture in the right superior pulmonary vein.  A retrograde cardioplegia cannula was placed via a pursestring suture in the right atrium and directed into the coronary sinus.  An antegrade cardioplegia cannula was placed in the ascending aorta.  The aorta was crossclamped.  The left ventricle was emptied via aortic root vent.  Cardiac arrest then was achieved with a combination of cold antegrade and retrograde blood cardioplegia and topical iced saline. There was a complete diastolic arrest and myocardial septal cooling to 10 degrees Celsius.  The following distal anastomoses were performed.  First, a reversed saphenous vein graft was placed sequentially to obtuse marginals 1 and 2.  The mammary could not reach these vessels sequentially so it was used for the LAD.  The vein was of good quality and was anastomosed side-to-side to OM1 and end-to-side to OM2.  Both of these were done with running 7-0 Prolene sutures.  All anastomoses were probed proximally and distally at their completion to ensure patency before tying the suture.  At the completion of the distal anastomosis to OM2, cardioplegia was  administered on the vein graft.  There was good flow and good hemostasis.  Additional retrograde cardioplegia was administered.  The left mammary artery was brought through a window in the pericardium.  The distal end was beveled and was then anastomosed end-to-side to the distal LAD.  The LAD did have some plaquing throughout its course.  The mammary was anastomosed end-to-side with a running 8-0 Prolene suture.  At the completion of the mammary to LAD anastomosis, the bulldog clamp was briefly removed to inspect for hemostasis.  Immediate and rapid septal rewarming was noted.  The bulldog clamp was replaced and the mammary pedicle was tacked to the epicardial surface of the heart with 6-0 Prolene sutures.  Additional cardioplegia was administered via the retrograde cannula and additional doses were given at 15-20 minute intervals throughout the open portion of the procedure.  Carbon dioxide was insufflated into the field. An aortotomy was made just above the sinotubular junction and extended into the noncoronary size of Valsalva.  Inspection of the valve and aortic root showed an intimal tear that extended from the left/noncoronary commissure to the origin of the left main coronary artery.  There  was a distinct flap extending into the origin of the left main coronary artery but it was not possible to tell if this was in continuity with the more distal dissection noted on catheterization.  The right coronary cusp was normal.  The left appeared foreshortened and there was a long thin fibrous band arising from the Left/noncoronary commissure, which ultimately turned out to be the majority of the free edge of the left cusp; however, that was not recognized immediately.  There was an obvious tear along the free edge of the noncoronary cusp extending from the commissure with the left to the nodule of Arantius.  It was felt that this was potentially repairable.  A 6-0 Gore-Tex suture was  used to reattach this detached free edge of the leaflet to the remaining leaflet in a running fashion.  It should be noted that no vegetations were noted anywhere on the aortic valve.  A resuspension stitch was placed at the left/noncoronary commissure.  The free edge of the left cusp, however, had a very strange appearance being very thin even more so than normal and on inspecting the thin filamentous band that had been removed from the commissure, it was clear that this was the majority of the free edge of the left leaflet and there did not appear to be any way to reconstruct this leaflet.  Therefore, the decision was made to replace the valve.  The leaflets were excised.  A portion was sent for culture, although again there was nothing that suggested infection in the appearance of the tissue.  The anulus sized for a 25 mm [employer] valve.  The valve was prepared per manufacturer's recommendations.  While preparing the valve, the intimal tear was repaired with interrupted 6-0 Prolene figure-of-eight sutures, achieving a good Result. 7-0 Prolene sutures were used at the origin of the left main coronary artery.  2-0 Ethibond horizontal mattress sutures with subannular pledgets then were placed circumferentially through the anulus. 15 sutures were utilized. They were passed through the sewing ring of the valve.  The valve was lowered into place and the sutures were sequentially tied.  The valve was well seated. Probing the anulus with a fine tip right-angle revealed no gaps. The coronary ostia were inspected and there was no impingement.  Rewarming was begun.  The aortotomy was closed in 2 layers with a running 4-0 Prolene suture.  The first layer was a running horizontal mattress suture followed by running simple suture.  The cardioplegia cannula was removed from the ascending aorta.  The vein graft was cut to length and the proximal vein graft anastomosis was performed to a  4.5 mm punch aortotomy with a running 6-0 Prolene suture. At the completion of the proximal anastomosis, the patient was placed in Trendelenburg position.  A warm dose of retrograde cardioplegia was administered.  Extensive de-airing maneuvers were performed and the aortic crossclamp was removed.  Total crossclamp time was 156 minutes. The patient required 2 defibrillations with 10 joules and then was initially in heart block but then later sinus bradycardia.  The retrograde cardioplegic cannula was removed.  The proximal and distal anastomoses were inspected for hemostasis.  Epicardial pacing wires were placed on the right ventricle and right atrium and DDD pacing was initiated at 90 beats per minute.  The left ventricular vent was removed again with additional de-airing maneuvers performed.  When the patient had rewarmed to a core temperature of 37 degrees Celsius, he was weaned from cardiopulmonary bypass on the  first attempt.  The total bypass time was 208 minutes.  He was on DDD pacing at 90 beats per minute and no inotropic support at the time of separation from bypass.  Postbypass transesophageal echocardiography revealed no change in left ventricular function.  There was good function of the prosthetic valve.  There was no residual air noted on echo.  A test dose of protamine was administered.  The atrial and aortic cannulae were removed.  The remainder of the protamine was administered without incident.  The chest was irrigated with warm saline.  Hemostasis was achieved.  The pericardium was reapproximated with interrupted 3-0 silk sutures.  It came together easily without tension or kinking the underlying grafts.  The left pleural and mediastinal chest tubes were placed through separate subcostal incisions. The sternum was closed with interrupted heavy gauge single and double stainless steel wires.  The pectoralis fascia, subcutaneous tissue, and skin were closed in standard  fashion as well as the leg incision.  The patient remained hemodynamically stable throughout the postbypass period and was transported from the operating room to the surgical intensive care unit in good condition.     Salvatore Decent Dorris Fetch, M.D.    SCH/MEDQ  D:  08/02/2012  T:  08/03/2012  Job:  [id number]

## 2012-08-03 NOTE — Progress Notes (Signed)
Patient ID: [name], male   DOB: 1961/05/17, 52 y.o.   MRN: [id number]                   [address]            [place] [id number]          [phone]     1 Day Post-Op Procedure(s) (LRB): AORTIC VALVE REPLACEMENT (AVR) (N/A) INTRAOPERATIVE TRANSESOPHAGEAL ECHOCARDIOGRAM (N/A) CORONARY ARTERY BYPASS GRAFTING (CABG) (N/A) ENDOVEIN HARVEST OF GREATER SAPHENOUS VEIN (Left)  Total Length of Stay:  LOS: 6 days  BP 115/75  Pulse 84  Temp 98.4 F (36.9 C) (Oral)  Resp 22  Ht 5\' 9"  (1.753 m)  Wt 202 lb 13.2 oz (92 kg)  BMI 29.95 kg/m2  SpO2 98%  .Intake/Output      01/13 0701 - 01/14 0700 01/14 0701 - 01/15 0700   P.O.  620   I.V. (mL/kg) 5245.2 (57) 282.3 (3.1)   Blood 898 350   IV Piggyback 1270 54   Total Intake(mL/kg) 7413.2 (80.6) 1306.3 (14.2)   Urine (mL/kg/hr) 4270 (1.9) 1130 (1)   Chest Tube 860 50   Total Output 5130 1180   Net +2283.2 +126.3             . sodium chloride 20 mL/hr at 08/02/12 1500  . sodium chloride 20 mL/hr at 08/02/12 1745  . dexmedetomidine Stopped (08/03/12 0500)  . insulin (NOVOLIN-R) infusion 4.2 Units/hr (08/03/12 1257)  . lactated ringers 20 mL/hr at 08/02/12 1745  . nitroGLYCERIN    . phenylephrine (NEO-SYNEPHRINE) Adult infusion Stopped (08/03/12 0615)     Lab Results  Component Value Date   WBC 6.5 08/03/2012   HGB 8.0* 08/03/2012   HCT 22.6* 08/03/2012   PLT 76* 08/03/2012   GLUCOSE 142* 08/03/2012   CHOL 157 07/31/2012   TRIG 39 07/31/2012   HDL 54 07/31/2012   LDLCALC 95 07/31/2012   ALT 14 07/28/2012   AST 17 07/28/2012   NA 138 08/03/2012   K 4.5 08/03/2012   CL 100 08/03/2012   CREATININE 0.95 08/03/2012   BUN 18 08/03/2012   CO2 24 08/03/2012   INR 1.42 08/02/2012   HGBA1C 8.8* 07/31/2012   After one unit prbs hgb up to 8.0 Stable day   Delight Ovens MD  Beeper [phone] Office [phone] 08/03/2012 6:49 PM

## 2012-08-03 NOTE — Progress Notes (Signed)
1 Day Post-Op Procedure(s) (LRB): AORTIC VALVE REPLACEMENT (AVR) (N/A) INTRAOPERATIVE TRANSESOPHAGEAL ECHOCARDIOGRAM (N/A) CORONARY ARTERY BYPASS GRAFTING (CABG) (N/A) ENDOVEIN HARVEST OF GREATER SAPHENOUS VEIN (Left) Subjective: C/o sore throat, mild incisional pain  Objective: Vital signs in last 24 hours: Temp:  [96.8 F (36 C)-100.2 F (37.9 C)] 99.7 F (37.6 C) (01/14 0700) Pulse Rate:  [84-91] 86  (01/14 0700) Cardiac Rhythm:  [-] Normal sinus rhythm (01/14 0500) Resp:  [10-32] 23  (01/14 0700) BP: (83-115)/(50-70) 101/61 mmHg (01/14 0700) SpO2:  [95 %-100 %] 97 % (01/14 0700) Arterial Line BP: (94-118)/(54-68) 118/60 mmHg (01/14 0700) FiO2 (%):  [40 %-50 %] 40 % (01/13 1850) Weight:  [192 lb 0.3 oz (87.1 kg)-202 lb 13.2 oz (92 kg)] 202 lb 13.2 oz (92 kg) (01/14 0500)  Hemodynamic parameters for last 24 hours: PAP: (23-39)/(14-26) 32/16 mmHg CO:  [3.2 L/min-7.2 L/min] 7.2 L/min CI:  [1.6 L/min/m2-3.5 L/min/m2] 3.5 L/min/m2  Intake/Output from previous day: 01/13 0701 - 01/14 0700 In: 7416 [I.V.:5248; Blood:898; IV Piggyback:1270] Out: 5130 [Urine:4270; Chest Tube:860] Intake/Output this shift:    General appearance: alert and no distress Neurologic: intact Heart: regular rate and rhythm Lungs: diminished breath sounds bibasilar Abdomen: normal findings: soft, non-tender  Lab Results:  Basename 08/03/12 0400 08/02/12 2107 08/02/12 2100  WBC 6.7 -- 7.3  HGB 7.5* 7.1* --  HCT 20.9* 21.0* --  PLT 95* -- 110*   BMET:  Basename 08/03/12 0400 08/02/12 2107 08/02/12 0530  NA 137 139 --  K 4.4 4.5 --  CL 103 105 --  CO2 24 -- 28  GLUCOSE 127* 160* --  BUN 19 21 --  CREATININE 0.91 0.90 --  CALCIUM 7.9* -- 9.1    PT/INR:  Basename 08/02/12 1500  LABPROT 17.0*  INR 1.42   ABG    Component Value Date/Time   PHART 7.374 08/02/2012 2106   HCO3 22.9 08/02/2012 2106   TCO2 23 08/02/2012 2107   ACIDBASEDEF 2.0 08/02/2012 2106   O2SAT 93.0 08/02/2012 2106    CBG (last 3)   Basename 08/03/12 0159 08/03/12 0013 08/02/12 2310  GLUCAP 124* 164* 119*    Assessment/Plan: S/P Procedure(s) (LRB): AORTIC VALVE REPLACEMENT (AVR) (N/A) INTRAOPERATIVE TRANSESOPHAGEAL ECHOCARDIOGRAM (N/A) CORONARY ARTERY BYPASS GRAFTING (CABG) (N/A) ENDOVEIN HARVEST OF GREATER SAPHENOUS VEIN (Left) POD # 1 AVR, CABG CV-  good hemodynamics- dc swan   Restart prasugrel  ASA, beta blocker, statin  RESP- pulmonary hygiene  RENAL- volume overload- diurese  Anemia secondary to ABL- transfuse 1 unit  Thrombocytopenia- follow  DC CT  OOB , ambulate  DM- CBG control reasonable- levimir, resume oral meds     LOS: 6 days    HENDRICKSON,STEVEN C 08/03/2012

## 2012-08-04 ENCOUNTER — Inpatient Hospital Stay (HOSPITAL_COMMUNITY): Payer: BC Managed Care – PPO

## 2012-08-04 LAB — GLUCOSE, CAPILLARY
Glucose-Capillary: 163 mg/dL — ABNORMAL HIGH (ref 70–99)
Glucose-Capillary: 179 mg/dL — ABNORMAL HIGH (ref 70–99)
Glucose-Capillary: 175 mg/dL — ABNORMAL HIGH (ref 70–99)

## 2012-08-04 LAB — CBC
HCT: 21 % — ABNORMAL LOW (ref 39.0–52.0)
Hemoglobin: 7.2 g/dL — ABNORMAL LOW (ref 13.0–17.0)
MCH: 30 pg (ref 26.0–34.0)
MCHC: 34.3 g/dL (ref 30.0–36.0)

## 2012-08-04 LAB — BASIC METABOLIC PANEL
BUN: 19 mg/dL (ref 6–23)
Creatinine, Ser: 0.96 mg/dL (ref 0.50–1.35)
GFR calc non Af Amer: >90 mL/min (ref >90)
Glucose, Bld: 170 mg/dL — ABNORMAL HIGH (ref 70–99)
Potassium: 4.5 mEq/L (ref 3.5–5.1)

## 2012-08-04 LAB — CULTURE, BLOOD (ROUTINE X 2)

## 2012-08-04 MED ORDER — INSULIN ASPART 100 UNIT/ML ~~LOC~~ SOLN: 0.0000 [IU] | Freq: Every day | SUBCUTANEOUS | Status: DC

## 2012-08-04 MED ORDER — POTASSIUM CHLORIDE CRYS ER 20 MEQ PO TBCR
20.0000 meq | EXTENDED_RELEASE_TABLET | Freq: Two times a day (BID) | ORAL | Status: DC
  Administered 2012-08-04 – 2012-08-07 (×7): 20 meq via ORAL
  Filled 2012-08-04 (×9): qty 1

## 2012-08-04 MED ORDER — MOVING RIGHT ALONG BOOK
Freq: Once | Status: AC
  Administered 2012-08-04: 12:00:00
  Filled 2012-08-04: qty 1

## 2012-08-04 MED ORDER — ASPIRIN 81 MG PO CHEW
81.0000 mg | CHEWABLE_TABLET | Freq: Every day | ORAL | Status: DC
  Administered 2012-08-04 – 2012-08-07 (×4): 81 mg via ORAL
  Filled 2012-08-04 (×4): qty 1

## 2012-08-04 MED ORDER — GUAIFENESIN ER 600 MG PO TB12
600.0000 mg | ORAL_TABLET | Freq: Two times a day (BID) | ORAL | Status: DC | PRN
  Administered 2012-08-07: 600 mg via ORAL
  Filled 2012-08-04: qty 1

## 2012-08-04 MED ORDER — SODIUM CHLORIDE 0.9 % IJ SOLN: 3.0000 mL | INTRAMUSCULAR | Status: DC | PRN

## 2012-08-04 MED ORDER — ZOLPIDEM TARTRATE 5 MG PO TABS: 5.0000 mg | ORAL_TABLET | Freq: Every evening | ORAL | Status: DC | PRN

## 2012-08-04 MED ORDER — ALUM & MAG HYDROXIDE-SIMETH 200-200-20 MG/5ML PO SUSP
15.0000 mL | ORAL | Status: DC | PRN
  Administered 2012-08-06: 30 mL via ORAL
  Filled 2012-08-04: qty 30

## 2012-08-04 MED ORDER — SODIUM CHLORIDE 0.9 % IJ SOLN
3.0000 mL | Freq: Two times a day (BID) | INTRAMUSCULAR | Status: DC
  Administered 2012-08-04 – 2012-08-06 (×5): 3 mL via INTRAVENOUS

## 2012-08-04 MED ORDER — FUROSEMIDE 10 MG/ML IJ SOLN
40.0000 mg | Freq: Once | INTRAMUSCULAR | Status: AC
  Administered 2012-08-04: 40 mg via INTRAVENOUS
  Filled 2012-08-04: qty 4

## 2012-08-04 MED ORDER — MAGNESIUM HYDROXIDE 400 MG/5ML PO SUSP: 30.0000 mL | Freq: Every day | ORAL | Status: DC | PRN

## 2012-08-04 MED ORDER — INSULIN ASPART 100 UNIT/ML ~~LOC~~ SOLN
0.0000 [IU] | Freq: Three times a day (TID) | SUBCUTANEOUS | Status: DC
  Administered 2012-08-04 – 2012-08-05 (×4): 3 [IU] via SUBCUTANEOUS
  Administered 2012-08-05: 8 [IU] via SUBCUTANEOUS
  Administered 2012-08-06 (×2): 5 [IU] via SUBCUTANEOUS
  Administered 2012-08-07: 3 [IU] via SUBCUTANEOUS

## 2012-08-04 MED ORDER — WARFARIN - PHYSICIAN DOSING INPATIENT
Freq: Every day | Status: DC
  Administered 2012-08-04: 18:00:00

## 2012-08-04 MED ORDER — WARFARIN SODIUM 5 MG PO TABS
5.0000 mg | ORAL_TABLET | Freq: Every day | ORAL | Status: DC
  Administered 2012-08-04 – 2012-08-05 (×2): 5 mg via ORAL
  Filled 2012-08-04 (×3): qty 1

## 2012-08-04 MED ORDER — ALPRAZOLAM 0.25 MG PO TABS: 0.2500 mg | ORAL_TABLET | Freq: Four times a day (QID) | ORAL | Status: DC | PRN

## 2012-08-04 MED ORDER — FUROSEMIDE 40 MG PO TABS
40.0000 mg | ORAL_TABLET | Freq: Every day | ORAL | Status: DC
  Administered 2012-08-05 – 2012-08-07 (×3): 40 mg via ORAL
  Filled 2012-08-04 (×3): qty 1

## 2012-08-04 MED ORDER — SODIUM CHLORIDE 0.9 % IV SOLN: 250.0000 mL | INTRAVENOUS | Status: DC | PRN

## 2012-08-04 MED FILL — Lidocaine HCl IV Inj 20 MG/ML: INTRAVENOUS | Qty: 5 | Status: AC

## 2012-08-04 MED FILL — Sodium Chloride Irrigation Soln 0.9%: Qty: 1000 | Status: AC

## 2012-08-04 MED FILL — Heparin Sodium (Porcine) Inj 1000 Unit/ML: INTRAMUSCULAR | Qty: 60 | Status: AC

## 2012-08-04 MED FILL — Sodium Bicarbonate IV Soln 8.4%: INTRAVENOUS | Qty: 50 | Status: AC

## 2012-08-04 MED FILL — Electrolyte-R (PH 7.4) Solution: INTRAVENOUS | Qty: 5000 | Status: AC

## 2012-08-04 MED FILL — Mannitol IV Soln 20%: INTRAVENOUS | Qty: 500 | Status: AC

## 2012-08-04 MED FILL — Sodium Chloride IV Soln 0.9%: INTRAVENOUS | Qty: 1000 | Status: AC

## 2012-08-04 NOTE — Progress Notes (Signed)
INFECTIOUS DISEASE PROGRESS NOTE  ID: Roy [name] is a 52 y.o. male with   Active Problems:  * No active hospital problems. *   Subjective: C/o sinus infection  Abtx:  Anti-infectives     Start     Dose/Rate Route Frequency Ordered Stop   08/02/12 2200   cefUROXime (ZINACEF) 1.5 g in dextrose 5 % 50 mL IVPB        1.5 g 100 mL/hr over 30 Minutes Intravenous Every 12 hours 08/02/12 1423 08/04/12 0922   08/02/12 2000   vancomycin (VANCOCIN) IVPB 1000 mg/200 mL premix        1,000 mg 200 mL/hr over 60 Minutes Intravenous  Once 08/02/12 1424 08/02/12 2046   08/02/12 0800   cefUROXime (ZINACEF) 750 mg in dextrose 5 % 50 mL IVPB  Status:  Discontinued        750 mg 100 mL/hr over 30 Minutes Intravenous To Surgery 08/01/12 1935 08/02/12 1331   08/02/12 0400   vancomycin (VANCOCIN) 1,250 mg in sodium chloride 0.9 % 250 mL IVPB  Status:  Discontinued        1,250 mg 166.7 mL/hr over 90 Minutes Intravenous To Surgery 08/01/12 0854 08/02/12 1331   08/02/12 0400   cefUROXime (ZINACEF) 1.5 g in dextrose 5 % 50 mL IVPB        1.5 g 100 mL/hr over 30 Minutes Intravenous To Surgery 08/01/12 0854 08/02/12 1329   08/02/12 0400   cefUROXime (ZINACEF) 750 mg in dextrose 5 % 50 mL IVPB  Status:  Discontinued        750 mg 100 mL/hr over 30 Minutes Intravenous To Surgery 08/01/12 0854 08/01/12 1935   07/28/12 1800   vancomycin (VANCOCIN) 1,250 mg in sodium chloride 0.9 % 250 mL IVPB  Status:  Discontinued        1,250 mg 166.7 mL/hr over 90 Minutes Intravenous Every 12 hours 07/28/12 1631 08/02/12 1424          Medications:  Scheduled:   . acetaminophen  1,000 mg Oral Q6H   Or  . acetaminophen (TYLENOL) oral liquid 160 mg/5 mL  975 mg Per Tube Q6H  . aspirin  81 mg Oral Daily  . atorvastatin  40 mg Oral q1800  . bisacodyl  10 mg Oral Daily   Or  . bisacodyl  10 mg Rectal Daily  . docusate sodium  200 mg Oral Daily  . furosemide  40 mg Oral Daily  . insulin aspart  0-15  Units Subcutaneous TID WC  . insulin aspart  0-5 Units Subcutaneous QHS  . insulin detemir  25 Units Subcutaneous Q1200  . linagliptin  5 mg Oral Daily  . metFORMIN  1,000 mg Oral Q breakfast  . metoprolol tartrate  25 mg Oral BID  . pantoprazole  40 mg Oral Daily  . pioglitazone  15 mg Oral Daily  . potassium chloride  20 mEq Oral BID  . prasugrel  10 mg Oral Daily  . sodium chloride  3 mL Intravenous Q12H  . warfarin  5 mg Oral q1800  . Warfarin - Physician Dosing Inpatient   Does not apply q1800    Objective: Vital signs in last 24 hours: Temp:  [98.1 F (36.7 C)-99.3 F (37.4 C)] 99.3 F (37.4 C) (01/15 1125) Pulse Rate:  [79-108] 95  (01/15 1125) Resp:  [1-35] 20  (01/15 1125) BP: (108-125)/(59-77) 116/77 mmHg (01/15 1125) SpO2:  [88 %-98 %] 94 % (01/15 1125) Weight:  [29  kg (202 lb 13.2 oz)] 92 kg (202 lb 13.2 oz) (01/15 0600)   General appearance: alert, cooperative and no distress Resp: clear to auscultation bilaterally Cardio: regular rate and rhythm GI: normal findings: bowel sounds normal and soft, non-tender  Lab Results  Basename 08/04/12 0408 08/03/12 1750 08/03/12 1742 08/03/12 0400  WBC 5.6 6.5 -- --  HGB 7.2* 8.0* -- --  HCT 21.0* 22.6* -- --  NA 134* -- 138 --  K 4.5 -- 4.5 --  CL 100 -- 100 --  CO2 27 -- -- 24  BUN 19 -- 18 --  CREATININE 0.96 0.95 -- --  GLU -- -- -- --   Liver Panel No results found for this basename: PROT:2,ALBUMIN:2,AST:2,ALT:2,ALKPHOS:2,BILITOT:2,BILIDIR:2,IBILI:2 in the last 72 hours Sedimentation Rate No results found for this basename: ESRSEDRATE in the last 72 hours C-Reactive Protein No results found for this basename: CRP:2 in the last 72 hours  Microbiology: Recent Results (from the past 240 hour(s))  CULTURE, BLOOD (ROUTINE X 2)     Status: Normal   Collection Time   07/28/12  2:15 PM      Component Value Range Status Comment   Specimen Description BLOOD RIGHT ARM   Final    Special Requests BOTTLES DRAWN  AEROBIC ONLY 10CC   Final    Culture  Setup Time 07/28/2012 22:33   Final    Culture NO GROWTH 5 DAYS   Final    Report Status 08/03/2012 FINAL   Final   CULTURE, BLOOD (ROUTINE X 2)     Status: Normal   Collection Time   07/28/12  2:20 PM      Component Value Range Status Comment   Specimen Description BLOOD RIGHT HAND   Final    Special Requests BOTTLES DRAWN AEROBIC ONLY 10CC   Final    Culture  Setup Time 07/28/2012 22:33   Final    Culture NO GROWTH 5 DAYS   Final    Report Status 08/03/2012 FINAL   Final   CULTURE, BLOOD (ROUTINE X 2)     Status: Normal   Collection Time   07/28/12  6:32 PM      Component Value Range Status Comment   Specimen Description BLOOD RIGHT HAND   Final    Special Requests BOTTLES DRAWN AEROBIC ONLY 10CC   Final    Culture  Setup Time 07/29/2012 00:47   Final    Culture NO GROWTH 5 DAYS   Final    Report Status 08/04/2012 FINAL   Final   MRSA PCR SCREENING     Status: Normal   Collection Time   07/30/12  2:15 PM      Component Value Range Status Comment   MRSA by PCR NEGATIVE  NEGATIVE Final   URINE CULTURE     Status: Normal   Collection Time   07/31/12  1:33 AM      Component Value Range Status Comment   Specimen Description URINE, RANDOM   Final    Special Requests NONE   Final    Culture  Setup Time 07/31/2012 12:46   Final    Colony Count NO GROWTH   Final    Culture NO GROWTH   Final    Report Status 08/01/2012 FINAL   Final   SURGICAL PCR SCREEN     Status: Normal   Collection Time   08/01/12  9:45 PM      Component Value Range Status Comment   MRSA, PCR NEGATIVE  NEGATIVE Final    Staphylococcus aureus NEGATIVE  NEGATIVE Final   TISSUE CULTURE     Status: Normal (Preliminary result)   Collection Time   08/02/12 10:55 AM      Component Value Range Status Comment   Specimen Description TISSUE   Final    Special Requests LEFT MAIN INTIMAL FLAP NO 1    Final    Gram Stain     Final    Value: NO WBC SEEN     NO ORGANISMS SEEN   Culture NO  GROWTH 2 DAYS   Final    Report Status PENDING   Incomplete   TISSUE CULTURE     Status: Normal (Preliminary result)   Collection Time   08/02/12 11:58 AM      Component Value Range Status Comment   Specimen Description TISSUE   Final    Special Requests AORTIC HEART VALVE LEAFLETS NO2   Final    Gram Stain     Final    Value: NO WBC SEEN     NO ORGANISMS SEEN   Culture NO GROWTH 2 DAYS   Final    Report Status PENDING   Incomplete     Studies/Results: Dg Chest Portable 1 View In Am  08/04/2012  *RADIOLOGY REPORT*  Clinical Data: Aortic valve disease.  Status post aortic valve replacement.  PORTABLE CHEST - 1 VIEW  Comparison: 08/03/2012  Findings: The Swan-Ganz catheter has been removed.  The sheath remains in the superior vena cava.  Heart size and pulmonary vascularity are normal.  Improved aeration bilaterally with persistent atelectasis/ small effusion at the left lung base. Tiny right effusion.  No pneumothorax.  IMPRESSION: Improved aeration.  Persistent atelectasis/left effusion.   Original Report Authenticated By: Francene Boyers, M.D.    Dg Chest Portable 1 View In Am  08/03/2012  *RADIOLOGY REPORT*  Clinical Data: Post cardiac surgery  PORTABLE CHEST - 1 VIEW  Comparison: Portable exam 0634 hours compared to 08/02/2038  Findings: Endotracheal and nasogastric tube no longer identified. Right jugular Swan-Ganz catheter with tip projecting over proximal right pulmonary artery unchanged. Mediastinal drain and left thoracostomy tube again noted. Enlargement of cardiac silhouette post CABG and AVR. Pulmonary vascular congestion. Perihilar edema. Low lung volumes with bibasilar atelectasis. No pneumothorax.  IMPRESSION: Slightly increased bibasilar atelectasis.   Original Report Authenticated By: Ulyses Southward, M.D.    Dg Chest Portable 1 View  08/02/2012  *RADIOLOGY REPORT*  Clinical Data: Post CABG  PORTABLE CHEST - 1 VIEW  Comparison: 07/31/2012  Findings:  Minimal, likely expected, widening  of the mediastinum post median sternotomy, CABG and aortic valve replacement.  Grossly unchanged cardiac silhouette given slightly decreased lung volumes.  Endotracheal tube overlies tracheal air column with tip above the carina. Right jugular approach PA catheter tip overlies the main pulmonary artery outflow tract. Enteric tube tip and side port project below left hemidiaphragm.  Mediastinal drain and left-sided chest tube.  No definite pneumothorax.  Mild cephalization of flow without frank evidence of edema. Worsening bibasilar opacities, left greater than right.  Query trace bilateral effusions.  Unchanged bones.  IMPRESSION: 1.  Appropriately positioned support apparatus as above.  No pneumothorax. 2.  Pulmonary venous congestion without frank evidence of edema. Trace bilateral effusions are suspected. 3.  Decreased lung volumes with worsening basilar opacities favored to represent atelectasis.   Original Report Authenticated By: Tacey Ruiz, MD      Assessment/Plan: AI, AVR Spoke with Path- his valve did not look  infected.  His BCx are (-) Valvular mass not seen at operation.  Will keep off anbx Let us know if we can be of further assistance.   [name] Infectious Diseases [phone] 08/04/2012, 2:04 PM   LOS: 7 days

## 2012-08-04 NOTE — Progress Notes (Signed)
2 Days Post-Op Procedure(s) (LRB): AORTIC VALVE REPLACEMENT (AVR) (N/A) INTRAOPERATIVE TRANSESOPHAGEAL ECHOCARDIOGRAM (N/A) CORONARY ARTERY BYPASS GRAFTING (CABG) (N/A) ENDOVEIN HARVEST OF GREATER SAPHENOUS VEIN (Left) Subjective: Some discomfort  Objective: Vital signs in last 24 hours: Temp:  [98.1 F (36.7 C)-99.7 F (37.6 C)] 98.8 F (37.1 C) (01/15 0420) Pulse Rate:  [79-99] 98  (01/15 0700) Cardiac Rhythm:  [-] Normal sinus rhythm (01/14 2000) Resp:  [1-30] 27  (01/15 0700) BP: (109-132)/(59-79) 124/67 mmHg (01/15 0700) SpO2:  [88 %-98 %] 97 % (01/15 0700) Arterial Line BP: (119-161)/(59-74) 124/65 mmHg (01/14 1400) Weight:  [202 lb 13.2 oz (92 kg)] 202 lb 13.2 oz (92 kg) (01/15 0600)  Hemodynamic parameters for last 24 hours: PAP: (35-41)/(17-21) 41/19 mmHg  Intake/Output from previous day: 01/14 0701 - 01/15 0700 In: 1716.3 [P.O.:740; I.V.:522.3; Blood:350; IV Piggyback:104] Out: 1745 [Urine:1695; Chest Tube:50] Intake/Output this shift:    General appearance: alert and no distress Neurologic: intact Heart: regular rate and rhythm Lungs: diminished breath sounds bibasilar Abdomen: normal findings: soft, non-tender  Lab Results:  Basename 08/04/12 0408 08/03/12 1750  WBC 5.6 6.5  HGB 7.2* 8.0*  HCT 21.0* 22.6*  PLT 66* 76*   BMET:  Basename 08/04/12 0408 08/03/12 1750 08/03/12 1742 08/03/12 0400  NA 134* -- 138 --  K 4.5 -- 4.5 --  CL 100 -- 100 --  CO2 27 -- -- 24  GLUCOSE 170* -- 142* --  BUN 19 -- 18 --  CREATININE 0.96 0.95 -- --  CALCIUM 8.4 -- -- 7.9*    PT/INR:  Basename 08/02/12 1500  LABPROT 17.0*  INR 1.42   ABG    Component Value Date/Time   PHART 7.374 08/02/2012 2106   HCO3 22.9 08/02/2012 2106   TCO2 27 08/03/2012 1742   ACIDBASEDEF 2.0 08/02/2012 2106   O2SAT 93.0 08/02/2012 2106   CBG (last 3)   Basename 08/04/12 0724 08/04/12 0419 08/03/12 2327  GLUCAP 178* 163* 151*    Assessment/Plan: S/P Procedure(s) (LRB): AORTIC  VALVE REPLACEMENT (AVR) (N/A) INTRAOPERATIVE TRANSESOPHAGEAL ECHOCARDIOGRAM (N/A) CORONARY ARTERY BYPASS GRAFTING (CABG) (N/A) ENDOVEIN HARVEST OF GREATER SAPHENOUS VEIN (Left) Plan for transfer to step-down: see transfer orders POD # 2 AVR, CABG  CV- stable- continue ASA, effient, will add coumadin for 6 weeks for pericardial valve  RESP- bibasilar atelectasis- IS  RENAL- diurese  Anemia secondary to ABL- follow  Thrombocytopenia- follow   LOS: 7 days    Roy [name] C 08/04/2012

## 2012-08-04 NOTE — Progress Notes (Signed)
Patient ID: Roy [name], male   DOB: 1960-10-10, 52 y.o.   MRN: [id number]  SUBJECTIVE: s/p AVR and CABG. Progressing well.      Marland Kitchen acetaminophen  1,000 mg Oral Q6H   Or  . acetaminophen (TYLENOL) oral liquid 160 mg/5 mL  975 mg Per Tube Q6H  . aspirin  81 mg Oral Daily  . atorvastatin  40 mg Oral q1800  . bisacodyl  10 mg Oral Daily   Or  . bisacodyl  10 mg Rectal Daily  . docusate sodium  200 mg Oral Daily  . furosemide  40 mg Oral Daily  . insulin aspart  0-15 Units Subcutaneous TID WC  . insulin aspart  0-5 Units Subcutaneous QHS  . insulin detemir  25 Units Subcutaneous Q1200  . linagliptin  5 mg Oral Daily  . metFORMIN  1,000 mg Oral Q breakfast  . metoprolol tartrate  25 mg Oral BID  . pantoprazole  40 mg Oral Daily  . pioglitazone  15 mg Oral Daily  . potassium chloride  20 mEq Oral BID  . prasugrel  10 mg Oral Daily  . sodium chloride  3 mL Intravenous Q12H  . warfarin  5 mg Oral q1800  . Warfarin - Physician Dosing Inpatient   Does not apply q1800      Filed Vitals:   08/04/12 0900 08/04/12 1000 08/04/12 1022 08/04/12 1125  BP:   115/69 116/77  Pulse: 103 108 104 95  Temp:    99.3 F (37.4 C)  TempSrc:    Oral  Resp: 35 32  20  Height:      Weight:      SpO2: 92% 98%  94%    Intake/Output Summary (Last 24 hours) at 08/04/12 1350 Last data filed at 08/04/12 1100  Gross per 24 hour  Intake    984 ml  Output   1845 ml  Net   -861 ml    LABS: Basic Metabolic Panel:  Basename 08/04/12 0408 08/03/12 1750 08/03/12 1742 08/03/12 0400  NA 134* -- 138 --  K 4.5 -- 4.5 --  CL 100 -- 100 --  CO2 27 -- -- 24  GLUCOSE 170* -- 142* --  BUN 19 -- 18 --  CREATININE 0.96 0.95 -- --  CALCIUM 8.4 -- -- 7.9*  MG -- 2.4 -- 2.7*  PHOS -- -- -- --   Liver Function Tests: No results found for this basename: AST:2,ALT:2,ALKPHOS:2,BILITOT:2,PROT:2,ALBUMIN:2 in the last 72 hours No results found for this basename: LIPASE:2,AMYLASE:2 in the last 72  hours CBC:  Basename 08/04/12 0408 08/03/12 1750  WBC 5.6 6.5  NEUTROABS -- --  HGB 7.2* 8.0*  HCT 21.0* 22.6*  MCV 87.5 86.9  PLT 66* 76*   Cardiac Enzymes: No results found for this basename: CKTOTAL:3,CKMB:3,CKMBINDEX:3,TROPONINI:3 in the last 72 hours BNP: No components found with this basename: POCBNP:3 D-Dimer: No results found for this basename: DDIMER:2 in the last 72 hours Hemoglobin A1C: No results found for this basename: HGBA1C in the last 72 hours Fasting Lipid Panel: No results found for this basename: CHOL,HDL,LDLCALC,TRIG,CHOLHDL,LDLDIRECT in the last 72 hours Thyroid Function Tests: No results found for this basename: TSH,T4TOTAL,FREET3,T3FREE,THYROIDAB in the last 72 hours Anemia Panel: No results found for this basename: VITAMINB12,FOLATE,FERRITIN,TIBC,IRON,RETICCTPCT in the last 72 hours  RADIOLOGY: Dg Chest 2 View  07/31/2012  *RADIOLOGY REPORT*  Clinical Data: preoperative evaluation for coronary bypass  CHEST - 2 VIEW  Comparison: 07/28/2012  Findings: Normal heart size and vascularity.  Negative for CHF or pneumonia.  No focal airspace process, collapse, consolidation, effusion or pneumothorax.  Trachea midline.  IMPRESSION: No acute chest process   Original Report Authenticated By: Judie Petit. Shick, M.D.    X-ray Chest Pa And Lateral  07/28/2012  *RADIOLOGY REPORT*  Clinical Data: Cough and shortness of breath  CHEST - 2 VIEW  Comparison: June 23, 2012  Findings:  Lungs clear.  Heart size and pulmonary vascularity are normal.  No adenopathy.  No bone lesions.  IMPRESSION: No abnormality noted.   Original Report Authenticated By: Bretta Bang, M.D.    Ct Coronary Morp W/cta Cor W/score W/ca W/cm &/or Wo/cm  07/30/2012  Cardiac CT:  Indication: R/O dissection  Protocol:  The patient was fairly sick with recent MI, ? SBE and severe AR.  BP was low and he could only be premedicated with 2.5mg  of iv lopressor.  No nitro was given.  Dye allergy prophylaxis was also  given.  Average HR during scan was in the 90;s.  A 120 kV retrospective scan was done.  Patient received 80 cc of contrast. He was scanned on a Philips 256 slice scanner with gantry rotation speed 270 msec and collimation .9mm.  The 3D data set was reviewed using MIP, VRT and MPR modes  Findings: There was no evidence of sinus of valsalva aneurysm, or aortic dissection  Sinus of Valsalva: 3.2 cm Ascending Aorta: 3.2 cm Arch: 2.4 cm Descending thoracic aorta: 2.2 cm  The great vessels arose from the arch normally with no coarctation.  The Aortic valve appeared tri-leaflet.  Although not well seen there appeared to be a central area of malcoaptation between the noncoronary cusp and right coronary cusp.  No obvious vegetation was seen.  The main pulmonary artery was mildly dilated at 3.0 cm  There was no pericardial effusion.  Coronary arteries:  Left dominant with no anomalies  LM- calcified especially distally near take off of circumflex with no obvious dissection  LAD- patent with non obstructive calcific disease with distal vessel poorly visualized        Diagonals:  2 small diagonals visualize without critical disease  Circumflex- dominant and patent but with large stent in mid vessel making analysis impossible        OM1: Patent with stent  RCA: nondominant and small with likely obstructive disease in mid vessel  See separate report from [facility] for interpretation of limited soft tissue and lung windows  Impression:     1)    No aortic dissection Ascending aorta 3.2 cm see other       measurements above        2)    Trileaflet AV with central malcoaptation and no vegetation seen on poor quality study 3)    No LM dissection appreciated with dense area of calcification at circumflex take off 4)    Patent stent to mid left dominant circumflex artery 5)    No pericardial effusion 6)    Mildly dilated main pulmonary artery  Charlton Haws MD [facility]   Original Report Authenticated By: Charlton Haws, M.D.      PHYSICAL EXAM General: NAD Neck: No JVD, no thyromegaly or thyroid nodule.  Lungs: Clear to auscultation bilaterally with normal respiratory effort. CV: Nondisplaced PMI.  Heart regular S1/S2, no S3/S4, No murmurs.  No peripheral edema.  No carotid bruit.  Normal pedal pulses.  Abdomen: Soft, nontender, no hepatosplenomegaly, no distention.  Neurologic: Alert and oriented x 3.  Psych: Normal affect. Extremities:  No clubbing or cyanosis.   TELEMETRY: Reviewed telemetry pt in NSR  ASSESSMENT AND PLAN: 52 yo with history of CAD s/p PCI in 12/13 now with severe AI.  1. S/p AVR for severe AI. Agree with anticoagulation for few months.   2. CAD: s/p CABG. Most recent stent in December was a bare metal stent. Thus, I think the risk of resuming Effient (in addition to Aspirin and Warfarin) outweighs the benefit. I will discuss with Dr. Dorris Fetch.   3. Diabetes: Will increase Novolog and Lantus today, glucose still elevated.   Lorine Bears 08/04/2012 1:50 PM

## 2012-08-04 NOTE — Progress Notes (Signed)
Pt c/o lightheadedness; O2 sats checked; sats 83% RA; O2 2L applied; sats recovered to 95% after a few minutes; will cont. To monitor.

## 2012-08-05 LAB — GLUCOSE, CAPILLARY
Glucose-Capillary: 183 mg/dL — ABNORMAL HIGH (ref 70–99)
Glucose-Capillary: 268 mg/dL — ABNORMAL HIGH (ref 70–99)

## 2012-08-05 LAB — PREPARE RBC (CROSSMATCH)

## 2012-08-05 LAB — TYPE AND SCREEN

## 2012-08-05 LAB — TISSUE CULTURE: Gram Stain: NONE SEEN

## 2012-08-05 LAB — CBC
MCH: 29.7 pg (ref 26.0–34.0)
Platelets: 75 10*3/uL — ABNORMAL LOW (ref 150–400)
RBC: 2.19 MIL/uL — ABNORMAL LOW (ref 4.22–5.81)
RDW: 14.2 % (ref 11.5–15.5)

## 2012-08-05 LAB — BASIC METABOLIC PANEL
CO2: 26 mEq/L (ref 19–32)
Calcium: 8.4 mg/dL (ref 8.4–10.5)
GFR calc Af Amer: >90 mL/min (ref >90)
GFR calc non Af Amer: >90 mL/min (ref >90)
Sodium: 136 mEq/L (ref 135–145)

## 2012-08-05 LAB — PROTIME-INR: Prothrombin Time: 14.3 seconds (ref 11.6–15.2)

## 2012-08-05 MED ORDER — WARFARIN VIDEO: Freq: Once | Status: DC

## 2012-08-05 MED ORDER — COUMADIN BOOK
Freq: Once | Status: AC
  Administered 2012-08-05: 18:00:00
  Filled 2012-08-05: qty 1

## 2012-08-05 NOTE — Op Note (Signed)
Roy [name], Roy [name] NO.:  [id number]  MEDICAL RECORD NO.:  [id number]  LOCATION:  2012                         FACILITY:  [facility]  PHYSICIAN:  Burna Forts, M.D.DATE OF BIRTH:  [birth year]  DATE OF PROCEDURE: DATE OF DISCHARGE:                              OPERATIVE REPORT   INDICATIONS FOR PROCEDURE:  Mr. Roy [name] is a 53 year old gentleman who presents today for coronary artery bypass grafting and aortic valve replacement to be performed by Dr. Andrey Spearman.  He was brought to the holding area the morning of surgery where under local anesthesia with sedation, pulmonary artery and radial arterial lines were placed. He was then taken to the OR for routine induction of general anesthesia after which the TEE probe is prepared and then passed into the oropharynx and to the stomach for imaging of the cardiac structures.  PRECARDIOPULMONARY BYPASS TEE EXAMINATION:  Left ventricle:  The left ventricular chamber is seen initially in the short axis view.  Overall, this is an enlarged left ventricular diameter that is appreciated.  It measures 6.2 cm in diameter.  Overall, there is normal to satisfactory contractility appreciated in the short-axis and long-axis views.  All segmental wall areas thickened moving inward during systolic contraction.  Estimated ejection fraction in the 55%.  Mitral valve:  The mitral valve is seen initially in the four-chamber view.  Overall, there is mild increase in the size of the annular area appreciated.  Mitral leaflets are only mildly thickened.  They do appear to collapse just below the level of the annular area.  However, on color Doppler, there is 1 to 2+ mitral regurg flow appreciated above the level of the mitral valve during systolic ejection.  There are no flail segments.  There is no prolapse.  Aortic valve:  The aortic valve is seen initially in the short-axis view.  There are 3 cusps.  However, one of the cusps  appears slightly abnormal and on the long-axis view with the area of the LVOT in view, we could see that there is an essentially flail segment of one of the cusps such that there is a freely mobile string-like attachment appearing in the LVOT area during diastole.  This then is associated with a large regurgitant jet and therefore severe aortic insufficiency.  Again, this is demonstrated with color Doppler and multiple views.  Right ventricle:  The right ventricular chamber is normal chamber in size and function.  Tricuspid valve:  This is a normal appearing tricuspid valve, and there is trivial tricuspid regurgitant flow appreciated on color Doppler.  Right atrial and left atrial chambers are normal.  The patient is placed on cardiopulmonary bypass.  Aortic valve replacement is performed followed by coronary artery bypass grafting. The patient is separated from cardiopulmonary bypass with the initial attempt.  Unfortunately, images were not retained in the postbypass period for assessment of the valve and left ventricular structures.  By report, these were fine.  The patient was returned to the cardiac intensive care unit in stable condition.          ______________________________ Burna Forts, M.D.     JTM/MEDQ  D:  08/05/2012  T:  08/05/2012  Job:  [id number]

## 2012-08-05 NOTE — Progress Notes (Signed)
Patient ID: Roy [name], male   DOB: 1961/02/17, 52 y.o.   MRN: [id number]     SUBJECTIVE: Soreness at sternotomy site.  Hemoglobin low this morning, plan for transfusion.     Marland Kitchen acetaminophen  1,000 mg Oral Q6H   Or  . acetaminophen (TYLENOL) oral liquid 160 mg/5 mL  975 mg Per Tube Q6H  . aspirin  81 mg Oral Daily  . atorvastatin  40 mg Oral q1800  . bisacodyl  10 mg Oral Daily   Or  . bisacodyl  10 mg Rectal Daily  . docusate sodium  200 mg Oral Daily  . furosemide  40 mg Oral Daily  . insulin aspart  0-15 Units Subcutaneous TID WC  . insulin aspart  0-5 Units Subcutaneous QHS  . insulin detemir  25 Units Subcutaneous Q1200  . linagliptin  5 mg Oral Daily  . metFORMIN  1,000 mg Oral Q breakfast  . metoprolol tartrate  25 mg Oral BID  . pantoprazole  40 mg Oral Daily  . pioglitazone  15 mg Oral Daily  . potassium chloride  20 mEq Oral BID  . prasugrel  10 mg Oral Daily  . sodium chloride  3 mL Intravenous Q12H  . warfarin  5 mg Oral q1800  . Warfarin - Physician Dosing Inpatient   Does not apply q1800     Filed Vitals:   08/04/12 1437 08/04/12 1930 08/05/12 0536 08/05/12 0940  BP:  108/65 104/68 120/76  Pulse:  100 95 111  Temp:  98.5 F (36.9 C) 99.8 F (37.7 C)   TempSrc:  Oral Oral   Resp:  20 17   Height:      Weight:      SpO2: 95% 99% 95%     Intake/Output Summary (Last 24 hours) at 08/05/12 1004 Last data filed at 08/05/12 0538  Gross per 24 hour  Intake    960 ml  Output   1550 ml  Net   -590 ml    LABS: Basic Metabolic Panel:  Basename 08/05/12 0610 08/04/12 0408 08/03/12 1750 08/03/12 0400  NA 136 134* -- --  K 4.4 4.5 -- --  CL 101 100 -- --  CO2 26 27 -- --  GLUCOSE 163* 170* -- --  BUN 17 19 -- --  CREATININE 0.80 0.96 -- --  CALCIUM 8.4 8.4 -- --  MG -- -- 2.4 2.7*  PHOS -- -- -- --   Liver Function Tests: No results found for this basename: AST:2,ALT:2,ALKPHOS:2,BILITOT:2,PROT:2,ALBUMIN:2 in the last 72 hours No results found  for this basename: LIPASE:2,AMYLASE:2 in the last 72 hours CBC:  Basename 08/05/12 0610 08/04/12 0408  WBC 4.8 5.6  NEUTROABS -- --  HGB 6.5* 7.2*  HCT 19.2* 21.0*  MCV 87.7 87.5  PLT 75* 66*   Cardiac Enzymes: No results found for this basename: CKTOTAL:3,CKMB:3,CKMBINDEX:3,TROPONINI:3 in the last 72 hours BNP: No components found with this basename: POCBNP:3 D-Dimer: No results found for this basename: DDIMER:2 in the last 72 hours Hemoglobin A1C: No results found for this basename: HGBA1C in the last 72 hours Fasting Lipid Panel: No results found for this basename: CHOL,HDL,LDLCALC,TRIG,CHOLHDL,LDLDIRECT in the last 72 hours Thyroid Function Tests: No results found for this basename: TSH,T4TOTAL,FREET3,T3FREE,THYROIDAB in the last 72 hours Anemia Panel: No results found for this basename: VITAMINB12,FOLATE,FERRITIN,TIBC,IRON,RETICCTPCT in the last 72 hours  RADIOLOGY: Dg Chest 2 View  07/31/2012  *RADIOLOGY REPORT*  Clinical Data: preoperative evaluation for coronary bypass  CHEST - 2 VIEW  Comparison: 07/28/2012  Findings: Normal heart size and vascularity.  Negative for CHF or pneumonia.  No focal airspace process, collapse, consolidation, effusion or pneumothorax.  Trachea midline.  IMPRESSION: No acute chest process   Original Report Authenticated By: Judie Petit. Shick, M.D.    X-ray Chest Pa And Lateral  07/28/2012  *RADIOLOGY REPORT*  Clinical Data: Cough and shortness of breath  CHEST - 2 VIEW  Comparison: June 23, 2012  Findings:  Lungs clear.  Heart size and pulmonary vascularity are normal.  No adenopathy.  No bone lesions.  IMPRESSION: No abnormality noted.   Original Report Authenticated By: Bretta Bang, M.D.    Ct Coronary Morp W/cta Cor W/score W/ca W/cm &/or Wo/cm  07/30/2012  Cardiac CT:  Indication: R/O dissection  Protocol:  The patient was fairly sick with recent MI, ? SBE and severe AR.  BP was low and he could only be premedicated with 2.5mg  of iv lopressor.   No nitro was given.  Dye allergy prophylaxis was also given.  Average HR during scan was in the 90;s.  A 120 kV retrospective scan was done.  Patient received 80 cc of contrast. He was scanned on a Philips 256 slice scanner with gantry rotation speed 270 msec and collimation .9mm.  The 3D data set was reviewed using MIP, VRT and MPR modes  Findings: There was no evidence of sinus of valsalva aneurysm, or aortic dissection  Sinus of Valsalva: 3.2 cm Ascending Aorta: 3.2 cm Arch: 2.4 cm Descending thoracic aorta: 2.2 cm  The great vessels arose from the arch normally with no coarctation.  The Aortic valve appeared tri-leaflet.  Although not well seen there appeared to be a central area of malcoaptation between the noncoronary cusp and right coronary cusp.  No obvious vegetation was seen.  The main pulmonary artery was mildly dilated at 3.0 cm  There was no pericardial effusion.  Coronary arteries:  Left dominant with no anomalies  LM- calcified especially distally near take off of circumflex with no obvious dissection  LAD- patent with non obstructive calcific disease with distal vessel poorly visualized        Diagonals:  2 small diagonals visualize without critical disease  Circumflex- dominant and patent but with large stent in mid vessel making analysis impossible        OM1: Patent with stent  RCA: nondominant and small with likely obstructive disease in mid vessel  See separate report from [facility] for interpretation of limited soft tissue and lung windows  Impression:     1)    No aortic dissection Ascending aorta 3.2 cm see other       measurements above        2)    Trileaflet AV with central malcoaptation and no vegetation seen on poor quality study 3)    No LM dissection appreciated with dense area of calcification at circumflex take off 4)    Patent stent to mid left dominant circumflex artery 5)    No pericardial effusion 6)    Mildly dilated main pulmonary artery  Charlton Haws MD [facility]    Original Report Authenticated By: Charlton Haws, M.D.     PHYSICAL EXAM General: NAD Neck: No JVD, no thyromegaly or thyroid nodule.  Lungs: Clear to auscultation bilaterally with normal respiratory effort. CV: Nondisplaced PMI.  Heart regular S1/S2, no S3/S4, 1/6 SEM RUSB.  Trace bilateral ankle edema.  No carotid bruit.  Normal pedal pulses.  Abdomen: Soft, nontender, no hepatosplenomegaly, no distention.  Neurologic: Alert and oriented x 3.  Psych: Normal affect. Extremities: No clubbing or cyanosis.   TELEMETRY: Reviewed telemetry pt in NSR  ASSESSMENT AND PLAN: 52 yo with history of CAD s/p PCI in 12/13 developed severe AI, now s/p CABG-AVR. 1. Aortic insufficiency: Suspect mechanical complication perhaps with cath caused AI. No evidence for endocarditis involving valve by pathology examination.  - Status post bioprosthetic AVR.  2. CAD: CABG with AVR (LIMA-LAD, seq SVG-OM1,OM2.  He has been on Effient > 1 month post BMS to LCx.  Agree with Dr. Jari Sportsman note from yesterday, think that risk of 3 anticoagulants outweighs benefit.  He can continue ASA 81 and will be coumadin for 6 wks post-valve.  He can stop Effient.  3. Anemia: Post-op.  To get transfusion today.   Marca Ancona 08/05/2012 10:04 AM

## 2012-08-05 NOTE — Progress Notes (Signed)
CARDIAC REHAB PHASE I   PRE:  Rate/Rhythm: 92 SR  BP:  Supine:   Sitting: 104/69  Standing:    SaO2: 98 2L 96 RA  MODE:  Ambulation: 310 ft   POST:  Rate/Rhythem: 96  BP:  Supine:   Sitting: 129/76  Standing:    SaO2: 90 RA with rest 94 RA 1345- 1420 Assisted X 1 and used walker to ambulate. Gait steady with walker VS stable. Pt able to walk 310 feet, c/o of feeling tired with walking. RA sat after walk 90% with rest improved to 94%. Pt back to recliner after walk with call light in reach. O2 left off, reported to RN. Beatrix Fetters

## 2012-08-05 NOTE — Progress Notes (Signed)
CRITICAL VALUE ALERT  Critical value received: HGB 6.5   Date of notification: 08/05/2012   Time of notification:  0730  Critical value read back: yes  Nurse who received alert:  Xan Ingraham, Blanchard Kelch   MD notified (1st page):  Gershon Crane PA-C in room with pt.  Time of first page:  0738   MD notified (2nd page):  Time of second page:  Responding MD: Gershon Crane PA-C  Time MD responded:0738

## 2012-08-05 NOTE — Progress Notes (Addendum)
[address]            [place] [address]          [phone]     3 Days Post-Op  Procedure(s) (LRB): AORTIC VALVE REPLACEMENT (AVR) (N/A) INTRAOPERATIVE TRANSESOPHAGEAL ECHOCARDIOGRAM (N/A) CORONARY ARTERY BYPASS GRAFTING (CABG) (N/A) ENDOVEIN HARVEST OF GREATER SAPHENOUS VEIN (Left) Subjective : Feels a little dizzy , mild to SOB   Objective  Telemetry sinus rhythm  Temp:  [98.5 F (36.9 C)-99.8 F (37.7 C)] 99.8 F (37.7 C) (01/16 0536) Pulse Rate:  [95-108] 95  (01/16 0536) Resp:  [17-35] 17  (01/16 0536) BP: (104-120)/(65-77) 104/68 mmHg (01/16 0536) SpO2:  [92 %-99 %] 95 % (01/16 0536)   Intake/Output Summary (Last 24 hours) at 08/05/12 0733 Last data filed at 08/05/12 0538  Gross per 24 hour  Intake   1194 ml  Output   1750 ml  Net   -556 ml       General appearance: alert, cooperative and no distress Heart: regular rate and rhythm and no murmur Lungs: dim in bases Abdomen: benign Extremities: trace edema Wound: dressings CDI  Lab Results:  Basename 08/04/12 0408 08/03/12 1750 08/03/12 1742 08/03/12 0400  NA 134* -- 138 --  K 4.5 -- 4.5 --  CL 100 -- 100 --  CO2 27 -- -- 24  GLUCOSE 170* -- 142* --  BUN 19 -- 18 --  CREATININE 0.96 0.95 -- --  CALCIUM 8.4 -- -- 7.9*  MG -- 2.4 -- 2.7*  PHOS -- -- -- --   No results found for this basename: AST:2,ALT:2,ALKPHOS:2,BILITOT:2,PROT:2,ALBUMIN:2 in the last 72 hours No results found for this basename: LIPASE:2,AMYLASE:2 in the last 72 hours  Basename 08/04/12 0408 08/03/12 1750  WBC 5.6 6.5  NEUTROABS -- --  HGB 7.2* 8.0*  HCT 21.0* 22.6*  MCV 87.5 86.9  PLT 66* 76*   No results found for this basename: CKTOTAL:4,CKMB:4,TROPONINI:4 in the last 72 hours No components found with this basename: POCBNP:3 No results found for this basename: DDIMER in the last 72 hours No results found for this basename: HGBA1C in the last 72 hours No results found for this  basename: CHOL,HDL,LDLCALC,TRIG,CHOLHDL in the last 72 hours No results found for this basename: TSH,T4TOTAL,FREET3,T3FREE,THYROIDAB in the last 72 hours No results found for this basename: VITAMINB12,FOLATE,FERRITIN,TIBC,IRON,RETICCTPCT in the last 72 hours  Medications: Scheduled    . acetaminophen  1,000 mg Oral Q6H   Or  . acetaminophen (TYLENOL) oral liquid 160 mg/5 mL  975 mg Per Tube Q6H  . aspirin  81 mg Oral Daily  . atorvastatin  40 mg Oral q1800  . bisacodyl  10 mg Oral Daily   Or  . bisacodyl  10 mg Rectal Daily  . docusate sodium  200 mg Oral Daily  . furosemide  40 mg Oral Daily  . insulin aspart  0-15 Units Subcutaneous TID WC  . insulin aspart  0-5 Units Subcutaneous QHS  . insulin detemir  25 Units Subcutaneous Q1200  . linagliptin  5 mg Oral Daily  . metFORMIN  1,000 mg Oral Q breakfast  . metoprolol tartrate  25 mg Oral BID  . pantoprazole  40 mg Oral Daily  . pioglitazone  15 mg Oral Daily  . potassium chloride  20 mEq Oral BID  . prasugrel  10 mg Oral Daily  . sodium chloride  3 mL  Intravenous Q12H  . warfarin  5 mg Oral q1800  . Warfarin - Physician Dosing Inpatient   Does not apply q1800     Radiology/Studies:  Dg Chest Portable 1 View In Am  08/04/2012  *RADIOLOGY REPORT*  Clinical Data: Aortic valve disease.  Status post aortic valve replacement.  PORTABLE CHEST - 1 VIEW  Comparison: 08/03/2012  Findings: The Swan-Ganz catheter has been removed.  The sheath remains in the superior vena cava.  Heart size and pulmonary vascularity are normal.  Improved aeration bilaterally with persistent atelectasis/ small effusion at the left lung base. Tiny right effusion.  No pneumothorax.  IMPRESSION: Improved aeration.  Persistent atelectasis/left effusion.   Original Report Authenticated By: Francene Boyers, M.D.     INR:1.13 Will add last result for INR, ABG once components are confirmed Will add last 4 CBG results once components are  confirmed  Assessment/Plan: S/P Procedure(s) (LRB): AORTIC VALVE REPLACEMENT (AVR) (N/A) INTRAOPERATIVE TRANSESOPHAGEAL ECHOCARDIOGRAM (N/A) CORONARY ARTERY BYPASS GRAFTING (CABG) (N/A) ENDOVEIN HARVEST OF GREATER SAPHENOUS VEIN (Left)  1. HGB -- 6.5 with some dizziness, will transfuse this am 1 unit  2 platelets 75 k, eising- follow 3 AC RX-cont 4 rhythm stable- follow 5 push pulm toilet/rehab modalities  LOS: 8 days    GOLD,WAYNE E 1/16/20147:33 AM    Patient seen and examined agree with above His CBG is up this morning- on metformin, actos, tradjenta and levimir- follow- if remains high may need to start meal coverage or increase PO meds

## 2012-08-06 ENCOUNTER — Other Ambulatory Visit: Payer: Self-pay | Admitting: *Deleted

## 2012-08-06 DIAGNOSIS — I2510 Atherosclerotic heart disease of native coronary artery without angina pectoris: Secondary | ICD-10-CM

## 2012-08-06 LAB — TYPE AND SCREEN: Unit division: 0

## 2012-08-06 LAB — GLUCOSE, CAPILLARY
Glucose-Capillary: 151 mg/dL — ABNORMAL HIGH (ref 70–99)
Glucose-Capillary: 225 mg/dL — ABNORMAL HIGH (ref 70–99)
Glucose-Capillary: 154 mg/dL — ABNORMAL HIGH (ref 70–99)

## 2012-08-06 LAB — CBC
Hemoglobin: 7.5 g/dL — ABNORMAL LOW (ref 13.0–17.0)
MCV: 86.7 fL (ref 78.0–100.0)
Platelets: 100 10*3/uL — ABNORMAL LOW (ref 150–400)
RBC: 2.55 MIL/uL — ABNORMAL LOW (ref 4.22–5.81)
WBC: 4.5 10*3/uL (ref 4.0–10.5)

## 2012-08-06 MED ORDER — POTASSIUM CHLORIDE CRYS ER 20 MEQ PO TBCR: 20.0000 meq | EXTENDED_RELEASE_TABLET | Freq: Every day | ORAL | Status: DC

## 2012-08-06 MED ORDER — WARFARIN SODIUM 7.5 MG PO TABS
7.5000 mg | ORAL_TABLET | Freq: Every day | ORAL | Status: DC
  Administered 2012-08-06: 7.5 mg via ORAL
  Filled 2012-08-06 (×2): qty 1

## 2012-08-06 MED ORDER — WARFARIN SODIUM 5 MG PO TABS: ORAL_TABLET | ORAL | Status: DC

## 2012-08-06 MED ORDER — FERROUS SULFATE 325 (65 FE) MG PO TABS
325.0000 mg | ORAL_TABLET | Freq: Three times a day (TID) | ORAL | Status: DC
  Administered 2012-08-06 – 2012-08-07 (×4): 325 mg via ORAL
  Filled 2012-08-06 (×7): qty 1

## 2012-08-06 MED ORDER — OXYCODONE HCL 5 MG PO TABS: 5.0000 mg | ORAL_TABLET | ORAL | Status: DC | PRN

## 2012-08-06 MED ORDER — FUROSEMIDE 40 MG PO TABS: 40.0000 mg | ORAL_TABLET | Freq: Every day | ORAL | Status: DC

## 2012-08-06 MED ORDER — INSULIN DETEMIR 100 UNIT/ML ~~LOC~~ SOLN: 25.0000 [IU] | Freq: Every day | SUBCUTANEOUS | Status: DC

## 2012-08-06 MED ORDER — FERROUS SULFATE 325 (65 FE) MG PO TABS: 325.0000 mg | ORAL_TABLET | Freq: Three times a day (TID) | ORAL | Status: DC

## 2012-08-06 NOTE — Progress Notes (Signed)
CARDIAC REHAB PHASE I   PRE:  Rate/Rhythm: 98 SR  BP:  Supine:   Sitting: 100/60  Standing:    SaO2: 92 RA  MODE:  Ambulation: 510 ft   POST:  Rate/Rhythem:109  BP:  Supine:   Sitting: 128/60  Standing:    SaO2: 89 RA 1130-1205 Pt tolerated ambulaiton well with hand held assist. Gait steady. Pt able to walk 510 feet. RA sat after walk 88-89%, with rest increased to 91%. Encouraged use of IS. Pt back to recliner after walk with call light in  reach. Completed discharge education with pt. He voices understanding. Pt agrees to Outpt. CRP in [place], will send referral.  Beatrix Fetters

## 2012-08-06 NOTE — Progress Notes (Addendum)
[address]            [place] [address]    4 Days Post-Op  Procedure(s) (LRB): AORTIC VALVE REPLACEMENT (AVR) (N/A) INTRAOPERATIVE TRANSESOPHAGEAL ECHOCARDIOGRAM (N/A) CORONARY ARTERY BYPASS GRAFTING (CABG) (N/A) ENDOVEIN HARVEST OF GREATER SAPHENOUS VEIN (Left) Subjective:  generally feels better but had a "rough night"  Objective  Telemetry sinus rhythm  Temp:  [98.5 F (36.9 C)-100.6 F (38.1 C)] 98.9 F (37.2 C) (01/17 0458) Pulse Rate:  [95-112] 98  (01/17 0458) Resp:  [18] 18  (01/17 0458) BP: (101-129)/(59-76) 119/75 mmHg (01/17 0458) SpO2:  [92 %-97 %] 92 % (01/17 0458) Weight:  [174 lb 2.6 oz (79 kg)] 174 lb 2.6 oz (79 kg) (01/17 0458)   Intake/Output Summary (Last 24 hours) at 08/06/12 0746 Last data filed at 08/05/12 2100  Gross per 24 hour  Intake  612.5 ml  Output    900 ml  Net -287.5 ml       General appearance: alert, cooperative and no distress Heart: regular rate and rhythm Lungs: dim in bases Abdomen: soft, non-tender Extremities: no edema Wound: incisions healing well  Lab Results:  Basename 08/05/12 0610 08/04/12 0408 08/03/12 1750  NA 136 134* --  K 4.4 4.5 --  CL 101 100 --  CO2 26 27 --  GLUCOSE 163* 170* --  BUN 17 19 --  CREATININE 0.80 0.96 --  CALCIUM 8.4 8.4 --  MG -- -- 2.4  PHOS -- -- --   No results found for this basename: AST:2,ALT:2,ALKPHOS:2,BILITOT:2,PROT:2,ALBUMIN:2 in the last 72 hours No results found for this basename: LIPASE:2,AMYLASE:2 in the last 72 hours  Basename 08/06/12 0505 08/05/12 0610  WBC 4.5 4.8  NEUTROABS -- --  HGB 7.5* 6.5*  HCT 22.1* 19.2*  MCV 86.7 87.7  PLT 100* 75*   No results found for this basename: CKTOTAL:4,CKMB:4,TROPONINI:4 in the last 72 hours No components found with this basename: POCBNP:3 No results found for this basename: DDIMER in the last 72 hours No results found for this basename: HGBA1C in the last 72 hours No results found for  this basename: CHOL,HDL,LDLCALC,TRIG,CHOLHDL in the last 72 hours No results found for this basename: TSH,T4TOTAL,FREET3,T3FREE,THYROIDAB in the last 72 hours No results found for this basename: VITAMINB12,FOLATE,FERRITIN,TIBC,IRON,RETICCTPCT in the last 72 hours  Medications: Scheduled    . acetaminophen  1,000 mg Oral Q6H   Or  . acetaminophen (TYLENOL) oral liquid 160 mg/5 mL  975 mg Per Tube Q6H  . aspirin  81 mg Oral Daily  . atorvastatin  40 mg Oral q1800  . bisacodyl  10 mg Oral Daily   Or  . bisacodyl  10 mg Rectal Daily  . docusate sodium  200 mg Oral Daily  . furosemide  40 mg Oral Daily  . insulin aspart  0-15 Units Subcutaneous TID WC  . insulin aspart  0-5 Units Subcutaneous QHS  . insulin detemir  25 Units Subcutaneous Q1200  . linagliptin  5 mg Oral Daily  . metFORMIN  1,000 mg Oral Q breakfast  . metoprolol tartrate  25 mg Oral BID  . pantoprazole  40 mg Oral Daily  . pioglitazone  15 mg Oral Daily  . potassium chloride  20 mEq Oral BID  . sodium chloride  3 mL Intravenous Q12H  . warfarin  5 mg Oral q1800  . warfarin   Does not apply Once  .  Warfarin - Physician Dosing Inpatient   Does not apply q1800     Radiology/Studies:  No results found.  INR:1.18 Will add last result for INR, ABG once components are confirmed Will add last 4 CBG results once components are confirmed  Assessment/Plan: S/P Procedure(s) (LRB): AORTIC VALVE REPLACEMENT (AVR) (N/A) INTRAOPERATIVE TRANSESOPHAGEAL ECHOCARDIOGRAM (N/A) CORONARY ARTERY BYPASS GRAFTING (CABG) (N/A) ENDOVEIN HARVEST OF GREATER SAPHENOUS VEIN (Left)  1 doing better overall 2 H/H improved after transfusion, hopefully can avoid further, will start Iron, platelets improved 3 CBG's improving control 4 cont ac rx- no ump in INR yet, will give 7.5 today 5 push rehab, pulm toilet as able 6 poss home 24-48 hours 7 d/c pacer wires in am  LOS: 9 days    [name],[name] 1/17/20147:46 AM           [phone]     Patient seen and examined. Agree with above  He's doing well and is anxious to go home  Plan for 6 weeks of coumadin with tissue valve. His INR hasn't bumped yet. Will try 7.5 mg today. He does not need to be therapeutic before dc. Will need INR Monday or Tuesday at home Will plan for dc tomorrow unless new issues arise

## 2012-08-06 NOTE — Discharge Summary (Signed)
[address]            [place] [address]          [phone]         Discharge Summary  Name: Roy [name] DOB: 1960-09-24 52 y.o. 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 Diabetes mellitus     type II  . Hyperlipidemia   . Hypertension   . Melanoma   . Heart attack 12-2011    [facility]  . Shortness of breath     Myocardial infarction 12/2011, 06/2012   . Aortic insufficiency       Procedures:  AORTIC VALVE REPLACEMENT (25 mm [facility] Ease pericardial tissue valve)  CORONARY ARTERY BYPASS GRAFTING (Left internal mammary artery to left anterior descending, sequential saphenous vein to obtuse marginals 1 and 2)  ENDOSCOPIC VEIN HARVEST LEFT THIGH- 08/02/2012     HPI:  The patient is a 52 y.o. male with diabetes, hypertension, hyperlipidemia and known coronary artery disease. He had an MI in 6/13 treated with PTCA and stenting. He says there was another vessel that was "65%" that was not stented. He did well until December. He had a knee arthroscopy. Shortly following that he had a NQWMI. Repeat cath showed progression and another stent was placed. He has been on aspirin and effient. Since his second catheterization, the patient states he has had trouble lying flat and gets short of breath with moderate exertion. He has not had any leg swelling. He says he has been having some vague right sided chest pain that is different from his MI pain. He went to the Cardiologist for  evaluation and was found to have a new diastolic murmur. An echocardiogram showed severe AI. A TEE done on 08/02/2012 showed a tricuspid aortic valve with severe AI due to the noncoronary cusp. There was a question of a vegetation or a flail leaflet.  He was subsequently admitted for further workup.    Hospital Course:  The patient was admitted to [facility] on 07/28/2012. He was seen by infectious disease and started on empiric antibiotics for possible infectious endocarditis. Cardiac catheterization was performed on 07/30/2012 by Dr. Antoine Poche, which revealed a 50% dissection on the left main and circumflex. He was seen in consultation by Dr. Dorris Fetch for consideration of aortic valve replacement.  Dr. Dorris Fetch agreed with the need for AVR, as well as coronary artery bypass.  All risks, benefits and alternatives of surgery were explained in detail, and the patient agreed to proceed. He was allowed a 5 day washout period for Effient prior to surgery.    The patient was taken to the operating room and underwent the above procedure.    The postoperative course was notable for anemia which required transfusion.  He was also started on an iron supplement.  His blood and operative cultures were negative and the valve did not appear pathologically to be infected.  He completed a course of antibiotics, and it was felt he would not  require further therapy.  He was started on Coumadin and low dose aspirin.  It was felt that as he had been on Effient for more than a month following stenting, he would not need to restart this post-op.    He has overall progressed well.  He has remained afebrile and vital signs are stable.  He is tolerating a regular diet and is ambulating with cardiac rehab.  He was restarted on his home diabetes medications, and has also required Levemir for control of his sugars.  (His pre-op hemoglobin A1C is 8.8.)  He has been started on Lasix for volume overload and is responding well.  We  anticipate discharge in 24-48 hours provided no acute changes occur.     Recent vital signs:  Filed Vitals:   08/06/12 1009  BP: 125/59  Pulse: 113  Temp:   Resp:     Recent laboratory studies:  CBC: Basename 08/06/12 0505 08/05/12 0610  WBC 4.5 4.8  HGB 7.5* 6.5*  HCT 22.1* 19.2*  PLT 100* 75*   BMET:  Basename 08/05/12 0610 08/04/12 0408  NA 136 134*  K 4.4 4.5  CL 101 100  CO2 26 27  GLUCOSE 163* 170*  BUN 17 19  CREATININE 0.80 0.96  CALCIUM 8.4 8.4    PT/INR:  Basename 08/06/12 0505  LABPROT 14.8  INR 1.18     Discharge Medications:     Medication List     As of 08/06/2012  1:08 PM    STOP taking these medications         NITROSTAT 0.4 MG SL tablet   Generic drug: nitroGLYCERIN      prasugrel 10 MG Tabs   Commonly known as: EFFIENT      TAKE these medications         albuterol 108 (90 BASE) MCG/ACT inhaler   Commonly known as: PROVENTIL HFA;VENTOLIN HFA   Inhale 2 puffs into the lungs every 4 (four) hours as needed for wheezing.      aspirin 81 MG tablet   Take 81 mg by mouth daily.      ferrous sulfate 325 (65 FE) MG tablet   Take 1 tablet (325 mg total) by mouth 3 (three) times daily with meals.      furosemide 40 MG tablet   Commonly known as: LASIX   Take 1 tablet (40 mg total) by mouth daily. X 1 week      insulin detemir 100 UNIT/ML injection   Commonly known as: LEVEMIR   Inject 25 Units into the skin daily at 12 noon.      metoprolol succinate 25 MG 24 hr tablet   Commonly known as: TOPROL-XL   Take 2 tablets (50 mg total) by mouth daily.      oxyCODONE 5 MG immediate release tablet   Commonly known as: Oxy IR/ROXICODONE   Take 1-2 tablets (5-10 mg total) by mouth every 3 (three) hours as needed for pain.      pioglitazone 15 MG tablet   Commonly known as: ACTOS   Take 15 mg by mouth daily.      potassium chloride SA 20 MEQ tablet   Commonly known as: K-DUR,KLOR-CON   Take 1 tablet (20 mEq total) by mouth daily. X 1  week      rosuvastatin 20 MG tablet   Commonly known as: CRESTOR   Take 1 tablet (20 mg total) by mouth daily.      sitaGLIPtan-metformin 50-1000 MG per tablet  Commonly known as: JANUMET   Take 1 tablet by mouth daily.      warfarin 5 MG tablet   Commonly known as: COUMADIN   Take 1.5 tablets (7.5 mg) po daily or as directed by Coumadin Clinic         Discharge Instructions:  The patient is to refrain from driving, heavy lifting or strenuous activity.  May shower daily and clean incisions with soap and water.  May resume regular diet.   Follow Up:      Discharge Orders    Future Appointments: Provider: Department: Dept Phone: Center:   08/24/2012 1:15 PM Loreli Slot, MD [facility] [place] [phone] [facility]   09/28/2012 3:30 PM Krista Blue [facility] MEDICAL ONCOLOGY [phone] None   09/28/2012 4:00 PM Benjiman Core, MD [facility] MEDICAL ONCOLOGY [phone] None     Future Orders Please Complete By Expires   Amb Referral to Cardiac Rehabilitation      Comments:   Pt agrees to [facility] CRP in [place], will send referral.      Follow-up Information    Follow up with Loreli Slot, MD. On 08/24/2012. (Have a chest x-ray at 12:15, then see MD at 1:15)    Contact information:   [address] [place] [address] [phone]       Follow up with Julien Nordmann, MD. Schedule an appointment as soon as possible for a visit in 2 weeks.   Contact information:   [address] [place] [address] [phone]       Please follow up. (Have a PT/INR (bloodwork for Coumadin) at Dr. Windell Hummingbird office within 48 hours of discharge )           Kiwanna Spraker H 08/06/2012, 1:08 PM

## 2012-08-07 LAB — GLUCOSE, CAPILLARY: Glucose-Capillary: 159 mg/dL — ABNORMAL HIGH (ref 70–99)

## 2012-08-07 LAB — PROTIME-INR: INR: 1.37 (ref 0.00–1.49)

## 2012-08-07 NOTE — Progress Notes (Signed)
Removed EPW and CT sutures per MD order per hospital protocol. Patient tolerated well, VSS. Pacing wired intact. Applied benzoin and steri strips to CT suture site. Patient reminded to remain in bed for 1 hour. Will continue to monitor closely. Lajuana Matte, RN

## 2012-08-07 NOTE — Progress Notes (Addendum)
5 Days Post-Op  Procedure(s) (LRB): AORTIC VALVE REPLACEMENT (AVR) (N/A) INTRAOPERATIVE TRANSESOPHAGEAL ECHOCARDIOGRAM (N/A) CORONARY ARTERY BYPASS GRAFTING (CABG) (N/A) ENDOVEIN HARVEST OF GREATER SAPHENOUS VEIN (Left) Subjective: Feels well, anxious to go home, no new issues  Objective  Telemetry SR/ST  Temp:  [98.4 F (36.9 C)-98.6 F (37 C)] 98.4 F (36.9 C) (01/18 0351) Pulse Rate:  [101-113] 102  (01/18 0351) Resp:  [18-20] 20  (01/18 0351) BP: (124-128)/(59-72) 124/72 mmHg (01/18 0351) SpO2:  [92 %-96 %] 92 % (01/18 0351) Weight:  [195 lb 11.2 oz (88.769 kg)] 195 lb 11.2 oz (88.769 kg) (01/18 0351)   Intake/Output Summary (Last 24 hours) at 08/07/12 0752 Last data filed at 08/06/12 1958  Gross per 24 hour  Intake   1320 ml  Output      0 ml  Net   1320 ml       General appearance: alert, cooperative and no distress Heart: regular rate and rhythm and no murmur Lungs: mildly dim in bases Abdomen: benign exam Extremities: nosig edema Wound: incisions healing well  Lab Results:  Basename 08/05/12 0610  NA 136  K 4.4  CL 101  CO2 26  GLUCOSE 163*  BUN 17  CREATININE 0.80  CALCIUM 8.4  MG --  PHOS --   No results found for this basename: AST:2,ALT:2,ALKPHOS:2,BILITOT:2,PROT:2,ALBUMIN:2 in the last 72 hours No results found for this basename: LIPASE:2,AMYLASE:2 in the last 72 hours  Basename 08/06/12 0505 08/05/12 0610  WBC 4.5 4.8  NEUTROABS -- --  HGB 7.5* 6.5*  HCT 22.1* 19.2*  MCV 86.7 87.7  PLT 100* 75*   No results found for this basename: CKTOTAL:4,CKMB:4,TROPONINI:4 in the last 72 hours No components found with this basename: POCBNP:3 No results found for this basename: DDIMER in the last 72 hours No results found for this basename: HGBA1C in the last 72 hours No results found for this basename: CHOL,HDL,LDLCALC,TRIG,CHOLHDL in the last 72 hours No results found for this basename: TSH,T4TOTAL,FREET3,T3FREE,THYROIDAB in the last 72  hours No results found for this basename: VITAMINB12,FOLATE,FERRITIN,TIBC,IRON,RETICCTPCT in the last 72 hours  Medications: Scheduled    . acetaminophen  1,000 mg Oral Q6H   Or  . acetaminophen (TYLENOL) oral liquid 160 mg/5 mL  975 mg Per Tube Q6H  . aspirin  81 mg Oral Daily  . atorvastatin  40 mg Oral q1800  . bisacodyl  10 mg Oral Daily   Or  . bisacodyl  10 mg Rectal Daily  . docusate sodium  200 mg Oral Daily  . ferrous sulfate  325 mg Oral TID WC  . furosemide  40 mg Oral Daily  . insulin aspart  0-15 Units Subcutaneous TID WC  . insulin aspart  0-5 Units Subcutaneous QHS  . insulin detemir  25 Units Subcutaneous Q1200  . linagliptin  5 mg Oral Daily  . metFORMIN  1,000 mg Oral Q breakfast  . metoprolol tartrate  25 mg Oral BID  . pantoprazole  40 mg Oral Daily  . pioglitazone  15 mg Oral Daily  . potassium chloride  20 mEq Oral BID  . sodium chloride  3 mL Intravenous Q12H  . warfarin  7.5 mg Oral q1800  . warfarin   Does not apply Once  . Warfarin - Physician Dosing Inpatient   Does not apply q1800     Radiology/Studies:  No results found.  INR:1.37 Will add last result for INR, ABG once components are confirmed Will add last 4 CBG results once components are  confirmed  Assessment/Plan: S/P Procedure(s) (LRB): AORTIC VALVE REPLACEMENT (AVR) (N/A) INTRAOPERATIVE TRANSESOPHAGEAL ECHOCARDIOGRAM (N/A) CORONARY ARTERY BYPASS GRAFTING (CABG) (N/A) ENDOVEIN HARVEST OF GREATER SAPHENOUS VEIN (Left) Plan for discharge: see discharge orders Sugars still requiring levemir   LOS: 10 days    Roy [name] 1/18/20147:52 AM

## 2012-08-07 NOTE — Progress Notes (Signed)
CARDIAC REHAB PHASE I NOTE  Pt has been doing well and ambulating on his. Patient has no questions at this time and knows we have an order in for him to attend outpatient cardiac rehab in [place]. Patient just ready to go home. Call bell in reach.

## 2012-08-08 NOTE — Progress Notes (Signed)
NCM spoke to pt and [facility] arranged for [facility] RN for lab draw. Faxed referral to [facility] and left message for in house weekday rep to follow up. Pt states he cannot drive for two weeks. Will follow up with [facility] in am on 1/20 for [facility] request.  Isidoro Donning Rn CCM Case Mgmt phone [phone]

## 2012-08-09 ENCOUNTER — Other Ambulatory Visit: Payer: Self-pay

## 2012-08-09 ENCOUNTER — Ambulatory Visit (INDEPENDENT_AMBULATORY_CARE_PROVIDER_SITE_OTHER): Payer: BC Managed Care – PPO

## 2012-08-09 DIAGNOSIS — Z952 Presence of prosthetic heart valve: Secondary | ICD-10-CM

## 2012-08-09 DIAGNOSIS — Z954 Presence of other heart-valve replacement: Secondary | ICD-10-CM

## 2012-08-09 LAB — PROTIME-INR: INR: 1.6 — AB (ref 0.9–1.1)

## 2012-08-09 MED ORDER — ZOLPIDEM TARTRATE 5 MG PO TABS: 5.0000 mg | ORAL_TABLET | Freq: Every evening | ORAL | Status: DC | PRN

## 2012-08-18 ENCOUNTER — Ambulatory Visit: Payer: BC Managed Care – PPO

## 2012-08-18 ENCOUNTER — Ambulatory Visit: Payer: BC Managed Care – PPO | Admitting: Cardiovascular Disease

## 2012-08-19 ENCOUNTER — Ambulatory Visit (INDEPENDENT_AMBULATORY_CARE_PROVIDER_SITE_OTHER): Payer: Self-pay

## 2012-08-19 ENCOUNTER — Telehealth: Payer: Self-pay

## 2012-08-19 DIAGNOSIS — D381 Neoplasm of uncertain behavior of trachea, bronchus and lung: Secondary | ICD-10-CM

## 2012-08-19 DIAGNOSIS — Z4802 Encounter for removal of sutures: Secondary | ICD-10-CM

## 2012-08-19 NOTE — Progress Notes (Signed)
Pt came into office concerned about chest tube site opening after steri strip fell off. He has been keeping it covered with a band aide. The site is about 1 cm in diameter circular, No redness, No drainage, No warmth at site.  He denies any fevers or pain at site. There is no signs of infection. Pt instructed to clean site with antibacterial soap and water daily, keep covered with band aide while he is out in public. And to call back if the site develops redness,drainage and fever.

## 2012-08-19 NOTE — Telephone Encounter (Signed)
Late entry: Our office was closed d/t inclement weather 1/29 Pt was called to r/s coumadin visit appt He mention to FD staff that he had a "hole" in his abdomen where chest tube was post CABG I called pt who says this is a "nickel-sized hole" in upper abdomen where chest tubes were removed. Says he keeps band aid over this and surgeons were aware since it was there at d/c Thinks he needs a stitch I explained we cannot stitch this up and he should call surgeons to see if they could see him yesterday (if open) He tells me he would do this Scheduled to see [place] tomm 1/31 to asses and check INR

## 2012-08-20 ENCOUNTER — Encounter: Payer: Self-pay | Admitting: Nurse Practitioner

## 2012-08-20 ENCOUNTER — Ambulatory Visit (INDEPENDENT_AMBULATORY_CARE_PROVIDER_SITE_OTHER): Payer: BC Managed Care – PPO | Admitting: Nurse Practitioner

## 2012-08-20 ENCOUNTER — Ambulatory Visit (INDEPENDENT_AMBULATORY_CARE_PROVIDER_SITE_OTHER): Payer: BC Managed Care – PPO

## 2012-08-20 ENCOUNTER — Telehealth: Payer: Self-pay

## 2012-08-20 VITALS — BP 110/78 | HR 84 | Ht 69.0 in | Wt 195.5 lb

## 2012-08-20 DIAGNOSIS — Z952 Presence of prosthetic heart valve: Secondary | ICD-10-CM

## 2012-08-20 DIAGNOSIS — I25810 Atherosclerosis of coronary artery bypass graft(s) without angina pectoris: Secondary | ICD-10-CM

## 2012-08-20 DIAGNOSIS — Z954 Presence of other heart-valve replacement: Secondary | ICD-10-CM

## 2012-08-20 DIAGNOSIS — I359 Nonrheumatic aortic valve disorder, unspecified: Secondary | ICD-10-CM

## 2012-08-20 DIAGNOSIS — E119 Type 2 diabetes mellitus without complications: Secondary | ICD-10-CM

## 2012-08-20 DIAGNOSIS — E785 Hyperlipidemia, unspecified: Secondary | ICD-10-CM

## 2012-08-20 DIAGNOSIS — R0789 Other chest pain: Secondary | ICD-10-CM

## 2012-08-20 DIAGNOSIS — I351 Nonrheumatic aortic (valve) insufficiency: Secondary | ICD-10-CM

## 2012-08-20 LAB — POCT INR: INR: 4.1

## 2012-08-20 NOTE — Patient Instructions (Addendum)
Your physician wants you to follow-up in: 3 months with Dr. Mariah Milling. You will receive a reminder letter in the mail two months in advance. If you don't receive a letter, please call our office to schedule the follow-up appointment.  Cardiac Rehab order has been faxed

## 2012-08-20 NOTE — Progress Notes (Signed)
Patient Name: Roy [name] Date of Encounter: 08/20/2012  Primary Care Provider:  Cala Bradford, MD Primary Cardiologist:  Concha Se, MD  Patient Profile  52 year old male with recent CABG x3 and aortic valve replacement who presents for followup.  Problem List   Past Medical History  Diagnosis Date  . 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 Diabetes mellitus     type II  . Hyperlipidemia   . Hypertension   . Melanoma   . CAD (coronary artery disease)     a. 12/2011 NSTEMI/PCI: Stenting of the OM;  b. 06/2012 NSTEMI/PCI: LCX 100%->BMS;  c. 07/2012 Cath: dissectied LM & LCX with patent LCX/OM stents->CABG x 3: LIMA->LAD, VG->OM1->OM2.  . Shortness of breath   . Aortic insufficiency     a. 07/2012 s/p AVR: #25 Magnaease Bioprosthetic Valve (performed @ time of CABG).   Past Surgical History  Procedure Date  . Rotator cuff repair     RIGHT  . Knee arthoscopy     LEFT KNEE  . Ulnar tunnel release     RIGHT  . Keratotomies     BILATERAL  . Septoplasty   . Tonsillectomy   . Knee arthroscopy 06/28/2012    Procedure: ARTHROSCOPY KNEE;  Surgeon: Dannielle Huh, MD;  Location: [facility] OR;  Service: Orthopedics;  Laterality: Left;  . Cardiac catheterization 12-22-11    with OM1 Promus stent 12/22/11  . Knee surgery     left  . Eye surgery   . Tee without cardioversion 07/28/2012    Procedure: TRANSESOPHAGEAL ECHOCARDIOGRAM (TEE);  Surgeon: Vesta Mixer, MD;  Location: [facility] ENDOSCOPY;  Service: Cardiovascular;  Laterality: N/A;  . Aortic valve replacement 08/02/2012    Procedure: AORTIC VALVE REPLACEMENT (AVR);  Surgeon: Loreli Slot, MD;  Location: [facility] OR;  Service: Open Heart Surgery;  Laterality: N/A;  . Intraoperative transesophageal echocardiogram 08/02/2012    Procedure: INTRAOPERATIVE TRANSESOPHAGEAL ECHOCARDIOGRAM;  Surgeon: Loreli Slot, MD;  Location: [facility] OR;  Service: Open Heart Surgery;  Laterality: N/A;  . Coronary  artery bypass graft 08/02/2012    Procedure: CORONARY ARTERY BYPASS GRAFTING (CABG);  Surgeon: Loreli Slot, MD;  Location: [facility] OR;  Service: Open Heart Surgery;  Laterality: N/A;  . Endovein harvest of greater saphenous vein 08/02/2012    Procedure: ENDOVEIN HARVEST OF GREATER SAPHENOUS VEIN;  Surgeon: Loreli Slot, MD;  Location[facility] OR;  Service: Open Heart Surgery;  Laterality: Left;  upper and lower leg    Allergies  Allergies  Allergen Reactions  . Contrast Media (Iodinated Diagnostic Agents) Other (See Comments)    Pt'shad streaks up and down his arms    HPI  52 year old male with the above problem list. He is status post non-ST elevation MI in June of 2013 with subsequent non-ST elevation MI in December of 2013 requiring stenting of totally occluded left circumflex. Following his MI, he continued to have dyspnea and underwent echocardiogram and then transesophageal echo on January 8 showing severe aortic insufficiency. Patient was readmitted to [facility] and underwent diagnostic catheterization showing non-flow-limiting dissection involving the left main and left circumflex. Cardiac CT angiography was performed to evaluate for dissection and did not show any aortic dissection. The case was reviewed with thoracic surgery and given severe aortic insufficiency, he was felt that patient would require aortic valve replacement with coronary artery bypass grafting. He underwent successful bioprosthetic aortic valve replacement along with CABG x3.  Post-op course was complicated by  anemia req PRBC's but otherwise, pt did well.  He was discharged on 1/19 and has been on coumadin therapy with plan to continue coumadin for a total of 6 wks.  Of note, Effient was discontinued during hospitalization as he was greater than 30 days out from placement of a BMS.  Since d/c, pt has been doing reasonably well.  He experiences pleuritic/sternal discomfort with deep breathing but otw has not had  anginal symptoms.  He is not experiencing DOE, pnd, orthopnea, n, v, dizziness, syncope, or edema.  His wounds are healing well, though he still has some discomfort involving his left thigh incision with bruising.  He has f/u with [facility] next week.  Home Medications  Prior to Admission medications   Medication Sig Start Date End Date Taking? Authorizing Provider  albuterol (PROVENTIL HFA;VENTOLIN HFA) 108 (90 BASE) MCG/ACT inhaler Inhale 2 puffs into the lungs every 4 (four) hours as needed for wheezing. 07/05/12  Yes Antonieta Iba, MD  aspirin 81 MG tablet Take 81 mg by mouth daily.   Yes Historical Provider, MD  ferrous sulfate 325 (65 FE) MG tablet Take 1 tablet (325 mg total) by mouth 3 (three) times daily with meals. 08/06/12  Yes Wilmon Pali, PA  metoprolol succinate (TOPROL-XL) 25 MG 24 hr tablet Take 25 mg by mouth daily. 07/15/12  Yes Antonieta Iba, MD  oxyCODONE (OXY IR/ROXICODONE) 5 MG immediate release tablet Take 1-2 tablets (5-10 mg total) by mouth every 3 (three) hours as needed for pain. 08/06/12  Yes Wilmon Pali, PA  pioglitazone (ACTOS) 15 MG tablet Take 15 mg by mouth daily.   Yes Historical Provider, MD  rosuvastatin (CRESTOR) 20 MG tablet Take 1 tablet (20 mg total) by mouth daily. 07/15/12  Yes Antonieta Iba, MD  sitaGLIPtan-metformin (JANUMET) 50-1000 MG per tablet Take 1 tablet by mouth daily.    Yes Historical Provider, MD  warfarin (COUMADIN) 5 MG tablet Take 1.5 tablets (7.5 mg) po daily or as directed by Coumadin Clinic 08/06/12  Yes Wilmon Pali, PA  zolpidem (AMBIEN) 5 MG tablet Take 1 tablet (5 mg total) by mouth at bedtime as needed for sleep. 08/09/12  Yes Antonieta Iba, MD    Review of Systems  As above, he continues to have sternal/incisional discomfort that is worse with deep breathing.  He denies palpitations, dyspnea, pnd, orthopnea, n, v, dizziness, syncope, edema, weight gain, or early satiety. All other systems reviewed and are otherwise  negative except as noted above.  Physical Exam  Blood pressure 110/78, pulse 84, height 5\' 9"  (1.753 m), weight 195 lb 8 oz (88.678 kg).  General: Pleasant, NAD Psych: Normal affect. Neuro: Alert and oriented X 3. Moves all extremities spontaneously. HEENT: Normal  Neck: Supple without bruits or JVD. Lungs:  Resp regular and unlabored, CTA - slightly diminished in left base. Heart: RRR no s3, s4, or murmurs.  Well healing midline incision. Abdomen: Soft, non-tender, non-distended, BS + x 4.  Extremities: No clubbing, cyanosis or edema. DP/PT/Radials 2+ and equal bilaterally.  Accessory Clinical Findings  ECG - rsr, 84, lateral t changes.  Assessment & Plan  1.  CAD:  S/p CABG x 3.  Doing well w/o recurrent angina.  He remains on asa, bb, and statin therapy.  I have also provided a Rx for prn ntg.  He will begin cardiac rehab @ [facility] next week and also has f/u with [facility] next week.  2.  AI s/p bioprosthetic AVR:  Doing well.  No dyspnea.  He is on a 6 wk course of coumadin.  INR elevated today - adjusted by coumadin clinic.  3.  HTN:  Stable.  4.  DM:  Says sugars are improving on oral regimen.  He is not using injectable insulin.  He will f/u with PCP in near future.  5.  HL:  Cont statin Rx.  LDL = 95 2 wks ago.  6.  Dispo:  F/u Dr. Mariah Milling in 3 mos or sooner if necessary.   Nicolasa Ducking, NP 08/20/2012, 12:58 PM

## 2012-08-20 NOTE — Telephone Encounter (Signed)
Message copied by [facility], Chong Wojdyla E on Fri Aug 20, 2012 11:01 AM ------      Message from: [place], [place]      Created: Fri Aug 20, 2012 10:59 AM      Regarding: RE: disability       Thanks Efraim Kaufmann, Got it and it is completed and sent out.       Wishing you and Jasmine December a good weekend.  Elease Hashimoto      ----- Message -----         From: Marcelle Overlie, RN         Sent: 08/12/2012   8:33 AM           To: Bary Leriche      Subject: disability                                               Hey!  Pt called yesterday asking about paperwork status. Jasmine December says she sent this back Monday/Tuesday. Do you know where we are in the process?      There was some question as to if form needed to be updated since he had surgery since last hospitalization and will need updated, etc.  Let me know what you think.  You have probably already taken care of this :)      Thanks

## 2012-08-23 ENCOUNTER — Encounter: Payer: Self-pay | Admitting: Cardiovascular Disease

## 2012-08-24 ENCOUNTER — Ambulatory Visit (INDEPENDENT_AMBULATORY_CARE_PROVIDER_SITE_OTHER): Payer: Self-pay | Admitting: Thoracic Surgery (Cardiothoracic Vascular Surgery)

## 2012-08-24 ENCOUNTER — Encounter: Payer: Self-pay | Admitting: Thoracic Surgery (Cardiothoracic Vascular Surgery)

## 2012-08-24 ENCOUNTER — Ambulatory Visit
Admission: RE | Admit: 2012-08-24 | Discharge: 2012-08-24 | Disposition: A | Discharge status: Home / Self Care | Payer: BC Managed Care – PPO | Source: Ambulatory Visit | Attending: Thoracic Surgery (Cardiothoracic Vascular Surgery) | Admitting: Thoracic Surgery (Cardiothoracic Vascular Surgery)

## 2012-08-24 VITALS — BP 125/78 | HR 88 | Resp 16 | Ht 69.0 in | Wt 186.0 lb

## 2012-08-24 DIAGNOSIS — Z951 Presence of aortocoronary bypass graft: Secondary | ICD-10-CM

## 2012-08-24 DIAGNOSIS — I359 Nonrheumatic aortic valve disorder, unspecified: Secondary | ICD-10-CM

## 2012-08-24 DIAGNOSIS — Z952 Presence of prosthetic heart valve: Secondary | ICD-10-CM

## 2012-08-24 DIAGNOSIS — I2510 Atherosclerotic heart disease of native coronary artery without angina pectoris: Secondary | ICD-10-CM

## 2012-08-24 DIAGNOSIS — I350 Nonrheumatic aortic (valve) stenosis: Secondary | ICD-10-CM

## 2012-08-24 DIAGNOSIS — Z954 Presence of other heart-valve replacement: Secondary | ICD-10-CM

## 2012-08-24 NOTE — Progress Notes (Signed)
HPI:  Mr. Siska returns today for a scheduled postoperative followup visit. He had coronary bypass grafting and aortic valve replacement with a pericardial valve on 08/02/2012. He had elected to have a pericardial valve rather than a mechanical valve due to his desire to avoid lifelong anticoagulation with Coumadin.  He says that since his surgery he's been doing well. He is having some incisional discomfort but is only taking the oxycodone occasionally, not even consistently once a day. He does have a little trouble sleeping at night and his appetite is not back to normal yet, but overall he feels well. He has started cardiac rehabilitation in his exercise tolerance is excellent.  Past Medical History  Diagnosis Date  . 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 Diabetes mellitus     type II  . Hyperlipidemia   . Hypertension   . Melanoma   . CAD (coronary artery disease)     a. 12/2011 NSTEMI/PCI: Stenting of the OM;  b. 06/2012 NSTEMI/PCI: LCX 100%->BMS;  c. 07/2012 Cath: dissectied LM & LCX with patent LCX/OM stents->CABG x 3: LIMA->LAD, VG->OM1->OM2.  . Shortness of breath   . Aortic insufficiency     a. 07/2012 s/p AVR: #25 Magnaease Bioprosthetic Valve (performed @ time of CABG).       Current Outpatient Prescriptions  Medication Sig Dispense Refill  . albuterol (PROVENTIL HFA;VENTOLIN HFA) 108 (90 BASE) MCG/ACT inhaler Inhale 2 puffs into the lungs every 4 (four) hours as needed for wheezing.  1 Inhaler  3  . aspirin 81 MG tablet Take 81 mg by mouth daily.      . ferrous sulfate 325 (65 FE) MG tablet Take 1 tablet (325 mg total) by mouth 3 (three) times daily with meals.  90 tablet  1  . metoprolol succinate (TOPROL-XL) 25 MG 24 hr tablet Take 25 mg by mouth daily.      . pioglitazone (ACTOS) 15 MG tablet Take 15 mg by mouth daily.      . rosuvastatin (CRESTOR) 20 MG tablet Take 1 tablet (20 mg total) by mouth daily.  90 tablet  3   . sitaGLIPtan-metformin (JANUMET) 50-1000 MG per tablet Take 1 tablet by mouth daily.       Marland Kitchen warfarin (COUMADIN) 5 MG tablet Take 1.5 tablets (7.5 mg) po daily or as directed by [facility]  60 tablet  2  . zolpidem (AMBIEN) 5 MG tablet Take 1 tablet (5 mg total) by mouth at bedtime as needed for sleep.  30 tablet  0  . oxyCODONE (OXY IR/ROXICODONE) 5 MG immediate release tablet Take 1-2 tablets (5-10 mg total) by mouth every 3 (three) hours as needed for pain.  30 tablet  0    Physical Exam BP 125/78  Pulse 88  Resp 16  Ht 5\' 9"  (1.753 m)  Wt 186 lb (84.369 kg)  BMI 27.47 kg/m2  SpO2 98% General 52 year old male in no acute distress Neurologic alert and oriented x3 no focal deficits Lungs clear with equal breath sounds bilaterally Cardiac exam regular rate and rhythm normal S1 and S2, faint systolic murmur, no rub No peripheral edema Sternum stable, incision healing well  Diagnostic Tests: Chest x-ray 08/24/12 Postoperative changes, small left pleural effusion  Impression: Mr. Odonohue is a 52 year old gentleman who now is about 3 weeks out from aVR, CABG. He is doing extremely well at this point in time. His exercise tolerance is good and he is having less pain than is  typical at this point.  He says his INR was elevated to 4.1 and  his Coumadin was held for a day and he is now taking 7.5 mg daily. We will plan to continue that for another 3 weeks (6 weeks postop). That point we'll discontinue the Coumadin and restart his Brillinta.  He is still not to lift any objects over 10 pounds for another 3 weeks. I think he'll be able to go back to work at the 8 week point as planned. He is already chomping at the bit to get back.   Plan: I will plan to see him back in 3 weeks to check on his progress and make final decisions regarding discontinuation of Coumadin and return to work.

## 2012-08-25 ENCOUNTER — Ambulatory Visit (INDEPENDENT_AMBULATORY_CARE_PROVIDER_SITE_OTHER): Payer: BC Managed Care – PPO

## 2012-08-25 DIAGNOSIS — Z954 Presence of other heart-valve replacement: Secondary | ICD-10-CM

## 2012-08-25 DIAGNOSIS — Z7901 Long term (current) use of anticoagulants: Secondary | ICD-10-CM

## 2012-08-25 DIAGNOSIS — I359 Nonrheumatic aortic valve disorder, unspecified: Secondary | ICD-10-CM

## 2012-08-25 DIAGNOSIS — Z952 Presence of prosthetic heart valve: Secondary | ICD-10-CM

## 2012-08-25 DIAGNOSIS — I351 Nonrheumatic aortic (valve) insufficiency: Secondary | ICD-10-CM

## 2012-08-25 LAB — POCT INR: INR: 3.1

## 2012-09-01 ENCOUNTER — Ambulatory Visit (INDEPENDENT_AMBULATORY_CARE_PROVIDER_SITE_OTHER): Payer: BC Managed Care – PPO

## 2012-09-01 DIAGNOSIS — I351 Nonrheumatic aortic (valve) insufficiency: Secondary | ICD-10-CM

## 2012-09-01 DIAGNOSIS — Z952 Presence of prosthetic heart valve: Secondary | ICD-10-CM

## 2012-09-01 DIAGNOSIS — Z7901 Long term (current) use of anticoagulants: Secondary | ICD-10-CM

## 2012-09-01 DIAGNOSIS — I359 Nonrheumatic aortic valve disorder, unspecified: Secondary | ICD-10-CM

## 2012-09-01 DIAGNOSIS — Z954 Presence of other heart-valve replacement: Secondary | ICD-10-CM

## 2012-09-04 ENCOUNTER — Other Ambulatory Visit: Payer: Self-pay

## 2012-09-08 ENCOUNTER — Ambulatory Visit (INDEPENDENT_AMBULATORY_CARE_PROVIDER_SITE_OTHER): Payer: BC Managed Care – PPO

## 2012-09-14 ENCOUNTER — Ambulatory Visit (INDEPENDENT_AMBULATORY_CARE_PROVIDER_SITE_OTHER): Payer: BC Managed Care – PPO | Admitting: Thoracic Surgery (Cardiothoracic Vascular Surgery)

## 2012-09-14 ENCOUNTER — Encounter: Payer: Self-pay | Admitting: Thoracic Surgery (Cardiothoracic Vascular Surgery)

## 2012-09-14 VITALS — BP 130/86 | HR 86 | Resp 20 | Ht 69.0 in | Wt 186.0 lb

## 2012-09-14 NOTE — Progress Notes (Signed)
HPI:  Mr. Roy [name] returns for his second followup visit. 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 His surgery was on January 13. Postoperatively he did well and did not have any significant complications. He was seen in the office a couple weeks ago which time he was doing well. He was very anxious to get back to work. I asked him to return today so we could further discuss when he is ready to return to work.  He says he has continued to do well he doesn't want to stop the Coumadin if possible because of making him very cold. He is still taking oxycodone at night occasionally and alternates that with Ambien to help him sleep. He is not using it during the day. He is hoping to return to work on March 10. He has not had any chest pain other than his incisional discomfort or shortness of breath.  Past Medical History  Diagnosis Date  . 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 Diabetes mellitus     type II  . Hyperlipidemia   . Hypertension   . Melanoma   . CAD (coronary artery disease)     a. 12/2011 NSTEMI/PCI: Stenting of the OM;  b. 06/2012 NSTEMI/PCI: LCX 100%->BMS;  c. 07/2012 Cath: dissectied LM & LCX with patent LCX/OM stents->CABG x 3: LIMA->LAD, VG->OM1->OM2.  . Shortness of breath   . Aortic insufficiency     a. 07/2012 s/p AVR: #25 Magnaease Bioprosthetic Valve (performed @ time of CABG).     Current Outpatient Prescriptions  Medication Sig Dispense Refill  . albuterol (PROVENTIL HFA;VENTOLIN HFA) 108 (90 BASE) MCG/ACT inhaler Inhale 2 puffs into the lungs every 4 (four) hours as needed for wheezing.  1 Inhaler  3  . aspirin 81 MG tablet Take 81 mg by mouth daily.      Marland Kitchen glimepiride (AMARYL) 2 MG tablet Take 2 mg by mouth daily before breakfast.       . metoprolol succinate (TOPROL-XL) 25 MG 24 hr tablet Take 25 mg by mouth  daily.      Marland Kitchen oxyCODONE (OXY IR/ROXICODONE) 5 MG immediate release tablet Take 1-2 tablets (5-10 mg total) by mouth every 3 (three) hours as needed for pain.  30 tablet  0  . pioglitazone (ACTOS) 15 MG tablet Take 15 mg by mouth daily.      . sitaGLIPtan-metformin (JANUMET) 50-1000 MG per tablet Take 1 tablet by mouth daily.       Marland Kitchen zolpidem (AMBIEN) 5 MG tablet Take 1 tablet (5 mg total) by mouth at bedtime as needed for sleep.  30 tablet  0  . rosuvastatin (CRESTOR) 20 MG tablet Take 1 tablet (20 mg total) by mouth daily.  90 tablet  3   No current facility-administered medications for this visit.    Physical Exam BP 130/86  Pulse 86  Resp 20  Ht 5\' 9"  (1.753 m)  Wt 186 lb (84.369 kg)  BMI 27.45 kg/m2  SpO12 36% 52 year old male in no acute distress General well-developed well-nourished Neurologic intact Cardiac regular rate and rhythm normal S1 and S2, 2/6 systolic murmur Lungs clear with equal breath sounds bilaterally Sternum stable, incision clean dry and intact  Diagnostic Tests: None  Impression: 52 year old gentleman status post aortic valve replacement and coronary bypass grafting on 08/02/2012. He is doing well at this point in time. He has been  driving and is not having any problems with that. He still occasionally uses a pain medication, mostly to help him sleep at night. His exercise tolerance is good, overall his recovery has been excellent and ahead and schedule. His activities are now unrestricted.  He is now 6 weeks out from surgery so we will stop his Coumadin. He will continue to take an aspirin a day.  I do feel that he is able to return to work as planned on March 10. He will need to be cautious and build into his work activities gradually as it does involve some significant lifting.   Plan: I will be happy to see Mr. Gilbert back any time if I can be of any further assistance with his care

## 2012-09-15 ENCOUNTER — Ambulatory Visit: Payer: Self-pay | Admitting: Cardiovascular Disease

## 2012-09-15 DIAGNOSIS — Z7901 Long term (current) use of anticoagulants: Secondary | ICD-10-CM

## 2012-09-15 DIAGNOSIS — Z952 Presence of prosthetic heart valve: Secondary | ICD-10-CM

## 2012-09-15 DIAGNOSIS — I351 Nonrheumatic aortic (valve) insufficiency: Secondary | ICD-10-CM

## 2012-09-18 ENCOUNTER — Encounter: Payer: Self-pay | Admitting: Cardiovascular Disease

## 2012-09-28 ENCOUNTER — Other Ambulatory Visit: Payer: BC Managed Care – PPO | Admitting: Lab

## 2012-09-28 ENCOUNTER — Ambulatory Visit: Payer: BC Managed Care – PPO | Admitting: Oncology

## 2012-11-19 ENCOUNTER — Ambulatory Visit: Payer: BC Managed Care – PPO | Admitting: Cardiovascular Disease

## 2013-01-19 ENCOUNTER — Emergency Department (HOSPITAL_COMMUNITY): Payer: BC Managed Care – PPO

## 2013-01-19 ENCOUNTER — Encounter (HOSPITAL_COMMUNITY): Payer: Self-pay | Admitting: *Deleted

## 2013-01-19 ENCOUNTER — Emergency Department (HOSPITAL_COMMUNITY)
Admission: EM | Admit: 2013-01-19 | Discharge: 2013-01-20 | Disposition: A | Discharge status: Home / Self Care | Payer: BC Managed Care – PPO | Attending: Emergency Medicine | Admitting: Emergency Medicine

## 2013-01-19 DIAGNOSIS — Z79899 Other long term (current) drug therapy: Secondary | ICD-10-CM | POA: Insufficient documentation

## 2013-01-19 DIAGNOSIS — R42 Dizziness and giddiness: Secondary | ICD-10-CM | POA: Insufficient documentation

## 2013-01-19 DIAGNOSIS — Z8639 Personal history of other endocrine, nutritional and metabolic disease: Secondary | ICD-10-CM | POA: Insufficient documentation

## 2013-01-19 DIAGNOSIS — Z85820 Personal history of malignant melanoma of skin: Secondary | ICD-10-CM | POA: Insufficient documentation

## 2013-01-19 DIAGNOSIS — I10 Essential (primary) hypertension: Secondary | ICD-10-CM | POA: Insufficient documentation

## 2013-01-19 DIAGNOSIS — N390 Urinary tract infection, site not specified: Secondary | ICD-10-CM | POA: Insufficient documentation

## 2013-01-19 DIAGNOSIS — Z951 Presence of aortocoronary bypass graft: Secondary | ICD-10-CM | POA: Insufficient documentation

## 2013-01-19 DIAGNOSIS — Z862 Personal history of diseases of the blood and blood-forming organs and certain disorders involving the immune mechanism: Secondary | ICD-10-CM | POA: Insufficient documentation

## 2013-01-19 DIAGNOSIS — Z8679 Personal history of other diseases of the circulatory system: Secondary | ICD-10-CM | POA: Insufficient documentation

## 2013-01-19 DIAGNOSIS — E119 Type 2 diabetes mellitus without complications: Secondary | ICD-10-CM | POA: Insufficient documentation

## 2013-01-19 DIAGNOSIS — E785 Hyperlipidemia, unspecified: Secondary | ICD-10-CM | POA: Insufficient documentation

## 2013-01-19 DIAGNOSIS — Z7982 Long term (current) use of aspirin: Secondary | ICD-10-CM | POA: Insufficient documentation

## 2013-01-19 DIAGNOSIS — K5732 Diverticulitis of large intestine without perforation or abscess without bleeding: Secondary | ICD-10-CM | POA: Insufficient documentation

## 2013-01-19 DIAGNOSIS — K5792 Diverticulitis of intestine, part unspecified, without perforation or abscess without bleeding: Secondary | ICD-10-CM

## 2013-01-19 DIAGNOSIS — I2510 Atherosclerotic heart disease of native coronary artery without angina pectoris: Secondary | ICD-10-CM | POA: Insufficient documentation

## 2013-01-19 DIAGNOSIS — N200 Calculus of kidney: Secondary | ICD-10-CM | POA: Insufficient documentation

## 2013-01-19 HISTORY — DX: Diverticulitis of intestine, part unspecified, without perforation or abscess without bleeding: K57.92

## 2013-01-19 LAB — CBC WITH DIFFERENTIAL/PLATELET
Basophils Absolute: 0 10*3/uL (ref 0.0–0.1)
Basophils Relative: 0 % (ref 0–1)
Eosinophils Relative: 2 % (ref 0–5)
HCT: 38.5 % — ABNORMAL LOW (ref 39.0–52.0)
MCH: 30 pg (ref 26.0–34.0)
MCHC: 35.6 g/dL (ref 30.0–36.0)
MCV: 84.4 fL (ref 78.0–100.0)
Monocytes Absolute: 1 10*3/uL (ref 0.1–1.0)
RDW: 13.9 % (ref 11.5–15.5)

## 2013-01-19 LAB — COMPREHENSIVE METABOLIC PANEL
Albumin: 3.8 g/dL (ref 3.5–5.2)
BUN: 16 mg/dL (ref 6–23)
Creatinine, Ser: 0.94 mg/dL (ref 0.50–1.35)
GFR calc Af Amer: >90 mL/min (ref >90)
Total Bilirubin: 0.6 mg/dL (ref 0.3–1.2)
Total Protein: 8.2 g/dL (ref 6.0–8.3)

## 2013-01-19 LAB — URINALYSIS, ROUTINE W REFLEX MICROSCOPIC
Glucose, UA: NEGATIVE mg/dL
Nitrite: POSITIVE — AB
Protein, ur: NEGATIVE mg/dL

## 2013-01-19 LAB — GLUCOSE, CAPILLARY: Glucose-Capillary: 181 mg/dL — ABNORMAL HIGH (ref 70–99)

## 2013-01-19 LAB — URINE MICROSCOPIC-ADD ON

## 2013-01-19 MED ORDER — TAMSULOSIN HCL 0.4 MG PO CAPS: 0.4000 mg | ORAL_CAPSULE | Freq: Every day | ORAL | Status: DC

## 2013-01-19 MED ORDER — ONDANSETRON HCL 4 MG/2ML IJ SOLN
INTRAMUSCULAR | Status: AC
  Administered 2013-01-19: 4 mg
  Filled 2013-01-19: qty 2

## 2013-01-19 MED ORDER — SODIUM CHLORIDE 0.9 % IV BOLUS (SEPSIS)
1000.0000 mL | Freq: Once | INTRAVENOUS | Status: AC
Start: 2013-01-19 — End: 2013-01-19
  Administered 2013-01-19: 1000 mL via INTRAVENOUS

## 2013-01-19 MED ORDER — MORPHINE SULFATE 4 MG/ML IJ SOLN
4.0000 mg | Freq: Once | INTRAMUSCULAR | Status: AC
  Administered 2013-01-19: 4 mg via INTRAVENOUS
  Filled 2013-01-19: qty 1

## 2013-01-19 MED ORDER — OXYCODONE-ACETAMINOPHEN 5-325 MG PO TABS: 2.0000 | ORAL_TABLET | ORAL | Status: DC | PRN

## 2013-01-19 MED ORDER — ONDANSETRON 4 MG PO TBDP: 8.0000 mg | ORAL_TABLET | Freq: Once | ORAL | Status: DC

## 2013-01-19 NOTE — ED Notes (Signed)
Severe abd. Pain;  Went to [facility]. This past Monday and told had diverticulitis; prescribed meds and one of meds making him dizzy - cipro.

## 2013-01-19 NOTE — ED Provider Notes (Signed)
History    CSN: [id number] Arrival date & time 01/19/13  2020  First MD Initiated Contact with Patient 01/19/13 2200     Chief Complaint  Patient presents with  . Abdominal Pain   (Consider location/radiation/quality/duration/timing/severity/associated sxs/prior Treatment) The history is provided by the patient.  Roy [name] is a 52 y.o. male history kidney stones, lymphoma in remission, diabetes, hyperlipidemia here presenting with abdominal pain. Left lower quadrant pain for the last week. Went to see her doctor and was prescribed Cipro and Flagyl. However he felt dizzy with Cipro and was switched to Bactrim today by urgent care. However he still has more pain so he came here to be evaluated. Denies fevers chills or vomiting or chest pain or shortness of breath. Denies dysuria or hematuria or diarrhea.   Past Medical History  Diagnosis Date  . 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 Diabetes mellitus     type II  . Hyperlipidemia   . Hypertension   . Melanoma   . CAD (coronary artery disease)     a. 12/2011 NSTEMI/PCI: Stenting of the OM;  b. 06/2012 NSTEMI/PCI: LCX 100%->BMS;  c. 07/2012 Cath: dissectied LM & LCX with patent LCX/OM stents->CABG x 3: LIMA->LAD, VG->OM1->OM2.  . Shortness of breath   . Aortic insufficiency     a. 07/2012 s/p AVR: #25 Magnaease Bioprosthetic Valve (performed @ time of CABG).  . Diverticulitis    Past Surgical History  Procedure Laterality Date  . Rotator cuff repair      RIGHT  . Knee arthoscopy      LEFT KNEE  . Ulnar tunnel release      RIGHT  . Keratotomies      BILATERAL  . Septoplasty    . Tonsillectomy    . Knee arthroscopy  06/28/2012    Procedure: ARTHROSCOPY KNEE;  Surgeon: Dannielle Huh, MD;  Location: [facility] OR;  Service: Orthopedics;  Laterality: Left;  . Cardiac catheterization  12-22-11    with OM1 Promus stent 12/22/11  . Knee surgery      left  . Eye surgery    . Tee without  cardioversion  07/28/2012    Procedure: TRANSESOPHAGEAL ECHOCARDIOGRAM (TEE);  Surgeon: Vesta Mixer, MD;  Location: [facility] ENDOSCOPY;  Service: Cardiovascular;  Laterality: N/A;  . Aortic valve replacement  08/02/2012    Procedure: AORTIC VALVE REPLACEMENT (AVR);  Surgeon: Loreli Slot, MD;  Location: [facility] OR;  Service: Open Heart Surgery;  Laterality: N/A;  . Intraoperative transesophageal echocardiogram  08/02/2012    Procedure: INTRAOPERATIVE TRANSESOPHAGEAL ECHOCARDIOGRAM;  Surgeon: Loreli Slot, MD;  Location: [facility] OR;  Service: Open Heart Surgery;  Laterality: N/A;  . Coronary artery bypass graft  08/02/2012    Procedure: CORONARY ARTERY BYPASS GRAFTING (CABG);  Surgeon: Loreli Slot, MD;  Location: [facility] OR;  Service: Open Heart Surgery;  Laterality: N/A;  . Endovein harvest of greater saphenous vein  08/02/2012    Procedure: ENDOVEIN HARVEST OF GREATER SAPHENOUS VEIN;  Surgeon: Loreli Slot, MD;  Location: [facility] OR;  Service: Open Heart Surgery;  Laterality: Left;  upper and lower leg   Family History  Problem Relation Age of Onset  . Coronary artery disease Mother   . Heart disease Father   . Heart attack Paternal Grandfather   . Lung cancer Mother   . Diabetes Paternal Grandmother   . Hyperlipidemia Brother   . Hyperlipidemia      father's side  .  Melanoma      fh   History  Substance Use Topics  . Smoking status: Never Smoker   . Smokeless tobacco: Never Used  . Alcohol Use: No    Review of Systems  Gastrointestinal: Positive for abdominal pain.  All other systems reviewed and are negative.    Allergies  Contrast media  Home Medications   Current Outpatient Rx  Name  Route  Sig  Dispense  Refill  . albuterol (PROVENTIL HFA;VENTOLIN HFA) 108 (90 BASE) MCG/ACT inhaler   Inhalation   Inhale 2 puffs into the lungs every 6 (six) hours as needed for wheezing.         Marland Kitchen aspirin 81 MG tablet   Oral   Take 81 mg by mouth daily.         .  diclofenac sodium (VOLTAREN) 1 % GEL   Topical   Apply 4 g topically 4 (four) times daily.         . finasteride (PROSCAR) 5 MG tablet   Oral   Take 0.625 mg by mouth daily.         Marland Kitchen losartan (COZAAR) 25 MG tablet   Oral   Take 25 mg by mouth daily.         . metoprolol succinate (TOPROL-XL) 25 MG 24 hr tablet   Oral   Take 25 mg by mouth daily.         . metroNIDAZOLE (FLAGYL) 500 MG tablet   Oral   Take 500 mg by mouth 3 (three) times daily.         . ondansetron (ZOFRAN-ODT) 8 MG disintegrating tablet   Oral   Take 8 mg by mouth every 8 (eight) hours as needed for nausea.         . pantoprazole (PROTONIX) 40 MG tablet   Oral   Take 40 mg by mouth daily.         . permethrin (ELIMITE) 5 % cream   Topical   Apply 1 application topically daily.         . pioglitazone (ACTOS) 15 MG tablet   Oral   Take 15 mg by mouth daily.         . rosuvastatin (CRESTOR) 10 MG tablet   Oral   Take 10 mg by mouth daily.         . sitaGLIPtan-metformin (JANUMET) 50-1000 MG per tablet   Oral   Take 1 tablet by mouth 2 (two) times daily with a meal.          . sulfamethoxazole-trimethoprim (BACTRIM DS) 800-160 MG per tablet   Oral   Take 1 tablet by mouth 2 (two) times daily.         Marland Kitchen tiZANidine (ZANAFLEX) 2 MG tablet   Oral   Take 2 mg by mouth daily as needed (for muscle relaxer).          BP 119/72  Pulse 97  Temp(Src) 98.7 F (37.1 C) (Oral)  Resp 16  SpO2 97% Physical Exam  Nursing note and vitals reviewed. Constitutional: He is oriented to person, place, and time.  Uncomfortable   HENT:  Head: Normocephalic.  Mouth/Throat: Oropharynx is clear and moist.  Eyes: Conjunctivae are normal. Pupils are equal, round, and reactive to light.  Neck: Normal range of motion. Neck supple.  Cardiovascular: Normal rate, regular rhythm and normal heart sounds.   Pulmonary/Chest: Effort normal and breath sounds normal. No respiratory distress. He has  no wheezes. He has  no rales.  Abdominal: Soft. Bowel sounds are normal.  + LLQ tenderness, no rebound. No CVAT   Musculoskeletal: Normal range of motion.  Neurological: He is alert and oriented to person, place, and time.  Skin: Skin is warm and dry.  Psychiatric: He has a normal mood and affect. His behavior is normal. Judgment and thought content normal.    ED Course  Procedures (including critical care time) Labs Reviewed  CBC WITH DIFFERENTIAL - Abnormal; Notable for the following:    HCT 38.5 (*)    Platelets 133 (*)    Lymphocytes Relative 11 (*)    All other components within normal limits  COMPREHENSIVE METABOLIC PANEL - Abnormal; Notable for the following:    Sodium 133 (*)    Glucose, Bld 200 (*)    All other components within normal limits  URINALYSIS, ROUTINE W REFLEX MICROSCOPIC - Abnormal; Notable for the following:    Color, Urine AMBER (*)    Ketones, ur 15 (*)    Nitrite POSITIVE (*)    Leukocytes, UA SMALL (*)    All other components within normal limits  GLUCOSE, CAPILLARY - Abnormal; Notable for the following:    Glucose-Capillary 181 (*)    All other components within normal limits  LIPASE, BLOOD  URINE MICROSCOPIC-ADD ON   Ct Abdomen Pelvis Wo Contrast  01/19/2013   *RADIOLOGY REPORT*  Clinical Data: Severe abdominal pain.  CT ABDOMEN AND PELVIS WITHOUT CONTRAST  Technique:  Multidetector CT imaging of the abdomen and pelvis was performed following the standard protocol without intravenous contrast.  Comparison: CT 03/31/2011 (CT)  Findings: Lung Bases: Atelectasis in the lung bases with discoid atelectasis in the left lower lobe.  CABG.  Apparent aortic valve replacement.  Liver:  Unenhanced CT was performed per clinician order.  Lack of IV contrast limits sensitivity and specificity, especially for evaluation of abdominal/pelvic solid viscera.  Grossly normal.  Spleen:  Normal.  Gallbladder:  Normal.  Common bile duct:  Normal.  Pancreas:  Normal.  Adrenal  glands:  Normal bilaterally.  Kidneys:  Bilateral nonobstructing renal collecting system calculi. No hydronephrosis.  Largest stone on the left measures 7 mm. Largest stone on the right measures 6 mm.  Both ureters appear within normal limits.  The small retroperitoneal lymph nodes are present, not meeting criteria for pathologic enlargement.  Stomach:  Stomach is decompressed.  No inflammatory changes.  Small bowel:  Duodenum appears within normal limits. Tiny mesenteric lymph nodes are present which appears similar to the prior exam of 03/30/2012.  These are likely related to the patient's history of previous lymphoma.  Colon:   No inflammatory changes of the right lower quadrant. Probable normal appendix adjacent to the cecum.  Proximal colon appears within normal limits.  Descending colonic diverticulosis with florid diverticulitis at the junction of the descending colon and sigmoid.  There is no perforation or abscess.  Small amount of reactive free fluid present in the anatomic pelvis.  Pelvic Genitourinary:  Urinary bladder decompressed.  Bones:  No aggressive osseous lesions.  Lumbar spondylosis.  Vasculature: Mild atherosclerosis.  Body Wall: Fat containing periumbilical hernia is tiny.  IMPRESSION: Uncomplicated acute sigmoid diverticulitis.  Bilateral nonobstructing renal collecting system calculi.  Prominent lymph nodes and retroperitoneal stranding associated with prior history of lymphoma.   Original Report Authenticated By: Andreas Newport, M.D.   No diagnosis found.  MDM  Roy [name] is a 52 y.o. male here with LLQ pain. Consider diverticulitis vs stone. Will do CT  and labs and UA and reassess.   11:24 PM CT showed acute uncomplicated diverticulitis and bilateral renal stones. Labs unremarkable. UA + UTI. Will have him continue bactrim and flagyl. Will prescribe percocet and flomax. Will have him f/u with PMD and urology.    Richardean Canal, MD 01/19/13 2325

## 2013-01-20 NOTE — ED Notes (Signed)
Pt dc to home. Pt sts understanding to dc instructions. Pt ambulatory to exit without difficulty.  Pt denies need for w/c.  

## 2013-03-10 ENCOUNTER — Ambulatory Visit
Admission: RE | Admit: 2013-03-10 | Discharge: 2013-03-10 | Disposition: A | Discharge status: Home / Self Care | Payer: BC Managed Care – PPO | Source: Ambulatory Visit | Attending: Family Medicine | Admitting: Family Medicine

## 2013-03-10 ENCOUNTER — Other Ambulatory Visit: Payer: Self-pay | Admitting: Family Medicine

## 2013-03-10 DIAGNOSIS — R079 Chest pain, unspecified: Secondary | ICD-10-CM

## 2013-03-10 DIAGNOSIS — M542 Cervicalgia: Secondary | ICD-10-CM

## 2013-03-11 ENCOUNTER — Other Ambulatory Visit: Payer: Self-pay | Admitting: *Deleted

## 2013-03-11 DIAGNOSIS — I359 Nonrheumatic aortic valve disorder, unspecified: Secondary | ICD-10-CM

## 2013-03-11 DIAGNOSIS — Z952 Presence of prosthetic heart valve: Secondary | ICD-10-CM

## 2013-03-15 ENCOUNTER — Encounter: Payer: BC Managed Care – PPO | Admitting: Thoracic Surgery (Cardiothoracic Vascular Surgery)

## 2013-03-15 ENCOUNTER — Ambulatory Visit (INDEPENDENT_AMBULATORY_CARE_PROVIDER_SITE_OTHER): Payer: BC Managed Care – PPO | Admitting: Thoracic Surgery (Cardiothoracic Vascular Surgery)

## 2013-03-15 ENCOUNTER — Encounter: Payer: Self-pay | Admitting: Thoracic Surgery (Cardiothoracic Vascular Surgery)

## 2013-03-15 VITALS — BP 122/82 | HR 75 | Resp 16 | Ht 69.0 in | Wt 197.0 lb

## 2013-03-15 DIAGNOSIS — Z951 Presence of aortocoronary bypass graft: Secondary | ICD-10-CM

## 2013-03-15 DIAGNOSIS — M9689 Other intraoperative and postprocedural complications and disorders of the musculoskeletal system: Secondary | ICD-10-CM

## 2013-03-15 DIAGNOSIS — IMO0002 Reserved for concepts with insufficient information to code with codable children: Secondary | ICD-10-CM

## 2013-03-15 DIAGNOSIS — Z952 Presence of prosthetic heart valve: Secondary | ICD-10-CM

## 2013-03-15 DIAGNOSIS — Z954 Presence of other heart-valve replacement: Secondary | ICD-10-CM

## 2013-03-15 NOTE — Progress Notes (Signed)
HPI:  Roy [name] returns today for evaluation of his sternum.  Roy [name] had an aortic valve replacement and coronary bypass grafting x2 on January 13. He was last seen in the office on February 25 at which time he was doing well. He has continued to do well from a cardiac standpoint since his surgery. Recently he's been having a good deal of pain in his neck and upper back. He was evaluated for that with x-rays of his neck, chest and sternum. 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 He was treated with meloxicam which has helped significantly although he does still have some pain in his neck and upper back. He was having pain around the sternoclavicular joints however that has resolved completely since starting the meloxicam. He is not aware of any clicking or popping or motion of the sternum.  Past Medical History  Diagnosis Date  . 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 Diabetes mellitus     type II  . Hyperlipidemia   . Hypertension   . Melanoma   . CAD (coronary artery disease)     a. 12/2011 NSTEMI/PCI: Stenting of the OM;  b. 06/2012 NSTEMI/PCI: LCX 100%->BMS;  c. 07/2012 Cath: dissectied LM & LCX with patent LCX/OM stents->CABG x 3: LIMA->LAD, VG->OM1->OM2.  . Shortness of breath   . Aortic insufficiency     a. 07/2012 s/p AVR: #25 Magnaease Bioprosthetic Valve (performed @ time of CABG).  . Diverticulitis       Current Outpatient Prescriptions  Medication Sig Dispense Refill  . aspirin 81 MG tablet Take 81 mg by mouth daily.      . diclofenac sodium (VOLTAREN) 1 % GEL Apply 4 g topically 4 (four) times daily.      . finasteride (PROSCAR) 5 MG tablet Take 0.625 mg by mouth daily.      Marland Kitchen losartan (COZAAR) 25 MG tablet Take 25 mg by mouth daily.      . meloxicam (MOBIC) 15 MG tablet Take 15 mg by mouth daily.      . metoprolol succinate (TOPROL-XL) 25 MG 24 hr tablet Take 25 mg by mouth daily.      . pantoprazole (PROTONIX)  40 MG tablet Take 40 mg by mouth daily.      . permethrin (ELIMITE) 5 % cream Apply 1 application topically daily.      . pioglitazone (ACTOS) 15 MG tablet Take 15 mg by mouth daily.      . rosuvastatin (CRESTOR) 10 MG tablet Take 10 mg by mouth daily.      . sitaGLIPtan-metformin (JANUMET) 50-1000 MG per tablet Take 1 tablet by mouth 2 (two) times daily with a meal.        No current facility-administered medications for this visit.    Physical Exam BP 122/82  Pulse 75  Resp 16  Ht 5\' 9"  (1.753 m)  Wt 197 lb (89.359 kg)  BMI 29.08 kg/m2  SpO10 55% 52 year old male in no acute distress Sternal incision well-healed Sternum stable with no clicking or popping Point tenderness just medial to the tip of the left scapula  Diagnostic Tests: Chest x-ray 03/10/13 CHEST - 2 VIEW  Comparison: August 24, 2012  Findings: Lungs clear. Heart size and pulmonary vascularity are  normal. No adenopathy. The patient is status post aortic valve  replacement and coronary artery bypass grafting. No pneumothorax.  There are no bony lesions.  IMPRESSION:  No edema or consolidation.  Original Report  Authenticated By: Bretta Bang, M.D.  Sternum Clinical Data: Chest and sternoclavicular joint pain left greater  than right  STERNUM - 2+ VIEW  Comparison: Concurrently obtained chest x-ray; most recent prior CT  scan of the chest July 30, 2012  Findings: Two views of the sternum demonstrates nonunion of the  midline sternotomy. The f sternotomy line is still visible as was  seen on the CT scan from 01/19/2013. However, the sternal wires  remain intact and there is no abnormal bony alignment. The aortic  valve replacement and multiple markers consistent with multivessel  CABG.  IMPRESSION:  Nonunion of median sternotomy.  There is no abnormal alignment of the bony structures and the  sternal wires remain intact and in good position.  Original Report Authenticated By: Malachy Moan,  M.D.  Impression: 52 year old gentleman about 8 months out from aortic valve replacement and coronary bypass grafting. He's been having a lot of neck and back and sternoclavicular joint discomfort recently. He has been working in his job involves a lot of lifting and climbing. He has had symptomatic improvement with meloxicam. During his evaluation he had a chest x-ray and then plain films of the sternum. On the sternal films there is a lucency which radiologist feels represents a nonunion. Clinically his sternum is stable no clicking or popping or other motion. He is nontender with the exception of one spot over one of the wires.  I do not think that the nonunion is the source of his discomfort. His sternum is clinically stable and no intervention is indicated. He does seem to be improving with anti-inflammatories.  Plan: Continue meloxicam. No new recommendations  I will be happy to see Roy [name] back at any time that can be of any further assistance with his care

## 2013-04-04 ENCOUNTER — Ambulatory Visit (INDEPENDENT_AMBULATORY_CARE_PROVIDER_SITE_OTHER): Payer: BC Managed Care – PPO | Admitting: Physician Assistant

## 2013-04-04 ENCOUNTER — Encounter: Payer: Self-pay | Admitting: Physician Assistant

## 2013-04-04 VITALS — BP 141/89 | HR 86 | Resp 16 | Ht 69.0 in | Wt 197.0 lb

## 2013-04-04 DIAGNOSIS — I359 Nonrheumatic aortic valve disorder, unspecified: Secondary | ICD-10-CM

## 2013-04-04 DIAGNOSIS — Z951 Presence of aortocoronary bypass graft: Secondary | ICD-10-CM

## 2013-04-04 DIAGNOSIS — I350 Nonrheumatic aortic (valve) stenosis: Secondary | ICD-10-CM

## 2013-04-04 DIAGNOSIS — Z952 Presence of prosthetic heart valve: Secondary | ICD-10-CM

## 2013-04-04 DIAGNOSIS — IMO0002 Reserved for concepts with insufficient information to code with codable children: Secondary | ICD-10-CM

## 2013-04-04 DIAGNOSIS — Z954 Presence of other heart-valve replacement: Secondary | ICD-10-CM

## 2013-04-04 DIAGNOSIS — I2510 Atherosclerotic heart disease of native coronary artery without angina pectoris: Secondary | ICD-10-CM

## 2013-04-04 DIAGNOSIS — M9689 Other intraoperative and postprocedural complications and disorders of the musculoskeletal system: Secondary | ICD-10-CM

## 2013-04-04 NOTE — Progress Notes (Signed)
[address]       [place] [address]             [id number]                  Roy [name] [facility] Medical Record #[id number] Date of Birth: [birth year]  Antonieta Iba, MD Cala Bradford, MD  Chief Complaint:   Nausea and warmth at [facility] site   History of Present Illness:     This is a 52 year old Caucasian male who is s/p CABG x 3, AVR on 08/02/2012 by Dr. Dorris Fetch. He states he has had intermittent nausea, heartburn, but denies abdominal pain. He has seen his primary care physician who has prescribed an anti emetic and Protonix. He presents today because his wife's friend noticed some warmth at left thigh EVH site. He denies any fever, chills, chest pain or shortness of breath.   History  Smoking status  . Never Smoker   Smokeless tobacco  . Never Used       Allergies  Allergen Reactions  . Contrast Media [Iodinated Diagnostic Agents] Other (See Comments)    Pt'shad streaks up and down his arms    Current Outpatient Prescriptions  Medication Sig Dispense Refill  . aspirin 81 MG tablet Take 81 mg by mouth daily.      . diclofenac sodium (VOLTAREN) 1 % GEL Apply 4 g topically 4 (four) times daily.      . finasteride (PROSCAR) 5 MG tablet Take 0.625 mg by mouth daily.      Marland Kitchen losartan (COZAAR) 25 MG tablet Take 25 mg by mouth daily.      . metoprolol succinate (TOPROL-XL) 25 MG 24 hr tablet Take 25 mg by mouth daily.      . pantoprazole (PROTONIX) 40 MG tablet Take 40 mg by mouth daily.      . permethrin (ELIMITE) 5 % cream Apply 1 application topically daily.      . pioglitazone (ACTOS) 15 MG tablet Take 15 mg by mouth daily.      . rosuvastatin (CRESTOR) 10 MG tablet Take 10 mg by mouth daily.      . sitaGLIPtan-metformin (JANUMET) 50-1000 MG per tablet Take 1 tablet by mouth 2 (two) times daily with a meal.        No current facility-administered medications for this visit.       Physical Exam: BP 141/89  Pulse 86  Resp 16   Ht 5\' 9"  (1.753 m)  Wt 89.359 kg (197 lb)  BMI 29.08 kg/m2  SpO2 98%  General appearance: alert, cooperative and no distress Neurologic: intact Heart: regular rate and rhythm, S1, S2 normal, no murmur, click, rub or gallop Lungs: clear to auscultation bilaterally Abdomen: soft, non-tender; bowel sounds normal; no masses,  no organomegaly Extremities: no edema, redness or tenderness in the calves or thighs. There is no warmth or erythema at [facility] site. Wounds: Clean and dry. No signs of infection in either the sternal or left EVH wounds.   Diagnostic Studies & Laboratory data:      None   Recent Radiology Findings: No results found.    Recent Labs: Lab Results  Component Value Date   WBC 8.7 01/19/2013   HGB 13.7 01/19/2013   HCT 38.5* 01/19/2013   PLT 133* 01/19/2013   GLUCOSE 200* 01/19/2013   CHOL 157 07/31/2012   TRIG 39 07/31/2012   HDL 54 07/31/2012   LDLCALC 95 07/31/2012  ALT 13 01/19/2013   AST 11 01/19/2013   NA 133* 01/19/2013   K 4.6 01/19/2013   CL 96 01/19/2013   CREATININE 0.94 01/19/2013   BUN 16 01/19/2013   CO2 26 01/19/2013   INR 3.0 09/08/2012   HGBA1C 8.8* 07/31/2012      Assessment / Plan:   There is no signs of infection (no drainage, warmth, or erythema)  at [facility] site.  The wound is well healed. Regarding his intermittent nausea, it was recommended that he follow up with his PCP. He admits to only taking half of his Protonix. I told him that to be effective, it should be taken in its entirety. Also, he was only on a baby enteric coated aspirin, as he was previously on Coumadin. I told him stop the baby enteric coated aspirin and take enteric coated aspirin 325 mg daily. However, if nausea worsens to take the baby enteric coated aspirin. He was previously taking Mobic, but has stopped that. I told him to avoid NSAIDS as these can cause nausea. If his nausea persists, he is to follow up with his medical doctor. He will be seen by our office on a PRN basis. He has a follow up  appointment to see Dr. Eldridge Dace within the year.       Ardelle Balls PA-C 04/04/2013 1:46 PM

## 2013-04-29 ENCOUNTER — Encounter (HOSPITAL_COMMUNITY): Payer: Self-pay | Admitting: Emergency Medicine

## 2013-04-29 DIAGNOSIS — Z85820 Personal history of malignant melanoma of skin: Secondary | ICD-10-CM | POA: Insufficient documentation

## 2013-04-29 DIAGNOSIS — S3981XA Other specified injuries of abdomen, initial encounter: Secondary | ICD-10-CM | POA: Insufficient documentation

## 2013-04-29 DIAGNOSIS — Z79899 Other long term (current) drug therapy: Secondary | ICD-10-CM | POA: Insufficient documentation

## 2013-04-29 DIAGNOSIS — Y929 Unspecified place or not applicable: Secondary | ICD-10-CM | POA: Insufficient documentation

## 2013-04-29 DIAGNOSIS — I2510 Atherosclerotic heart disease of native coronary artery without angina pectoris: Secondary | ICD-10-CM | POA: Insufficient documentation

## 2013-04-29 DIAGNOSIS — E785 Hyperlipidemia, unspecified: Secondary | ICD-10-CM | POA: Insufficient documentation

## 2013-04-29 DIAGNOSIS — Y9389 Activity, other specified: Secondary | ICD-10-CM | POA: Insufficient documentation

## 2013-04-29 DIAGNOSIS — Z7982 Long term (current) use of aspirin: Secondary | ICD-10-CM | POA: Insufficient documentation

## 2013-04-29 DIAGNOSIS — K5732 Diverticulitis of large intestine without perforation or abscess without bleeding: Secondary | ICD-10-CM | POA: Insufficient documentation

## 2013-04-29 DIAGNOSIS — Z87898 Personal history of other specified conditions: Secondary | ICD-10-CM | POA: Insufficient documentation

## 2013-04-29 DIAGNOSIS — I10 Essential (primary) hypertension: Secondary | ICD-10-CM | POA: Insufficient documentation

## 2013-04-29 DIAGNOSIS — Z951 Presence of aortocoronary bypass graft: Secondary | ICD-10-CM | POA: Insufficient documentation

## 2013-04-29 DIAGNOSIS — X500XXA Overexertion from strenuous movement or load, initial encounter: Secondary | ICD-10-CM | POA: Insufficient documentation

## 2013-04-29 DIAGNOSIS — E119 Type 2 diabetes mellitus without complications: Secondary | ICD-10-CM | POA: Insufficient documentation

## 2013-04-29 LAB — POCT I-STAT TROPONIN I: Troponin i, poc: 0.01 ng/mL (ref 0.00–0.08)

## 2013-04-29 LAB — COMPREHENSIVE METABOLIC PANEL
AST: 21 U/L (ref 0–37)
BUN: 11 mg/dL (ref 6–23)
CO2: 24 mEq/L (ref 19–32)
Chloride: 102 mEq/L (ref 96–112)
Creatinine, Ser: 0.92 mg/dL (ref 0.50–1.35)
GFR calc Af Amer: >90 mL/min (ref >90)
GFR calc non Af Amer: >90 mL/min (ref >90)
Glucose, Bld: 268 mg/dL — ABNORMAL HIGH (ref 70–99)
Total Bilirubin: 0.4 mg/dL (ref 0.3–1.2)

## 2013-04-29 LAB — LIPASE, BLOOD: Lipase: 40 U/L (ref 11–59)

## 2013-04-29 LAB — CBC WITH DIFFERENTIAL/PLATELET
Basophils Relative: 0 % (ref 0–1)
Eosinophils Absolute: 0.1 10*3/uL (ref 0.0–0.7)
Lymphs Abs: 1 10*3/uL (ref 0.7–4.0)
MCH: 30.4 pg (ref 26.0–34.0)
MCHC: 35.9 g/dL (ref 30.0–36.0)
Neutro Abs: 5 10*3/uL (ref 1.7–7.7)
Neutrophils Relative %: 74 % (ref 43–77)
Platelets: 116 10*3/uL — ABNORMAL LOW (ref 150–400)
RBC: 4.31 MIL/uL (ref 4.22–5.81)

## 2013-04-29 NOTE — ED Notes (Addendum)
Presents with bilateral upper quadrant described as sharp pain radiates down into groin area and is intermittently worse associated with nausea, diaphoresis, diarrhea, chills. Denies back/flank pain.  Pain began one week ago.

## 2013-04-30 ENCOUNTER — Emergency Department (HOSPITAL_COMMUNITY)
Admission: EM | Admit: 2013-04-30 | Discharge: 2013-04-30 | Disposition: A | Discharge status: Home / Self Care | Payer: BC Managed Care – PPO | Attending: Emergency Medicine | Admitting: Emergency Medicine

## 2013-04-30 ENCOUNTER — Emergency Department (HOSPITAL_COMMUNITY): Payer: BC Managed Care – PPO

## 2013-04-30 DIAGNOSIS — K5792 Diverticulitis of intestine, part unspecified, without perforation or abscess without bleeding: Secondary | ICD-10-CM

## 2013-04-30 LAB — URINALYSIS, ROUTINE W REFLEX MICROSCOPIC
Leukocytes, UA: NEGATIVE
Protein, ur: NEGATIVE mg/dL
Specific Gravity, Urine: 1.026 (ref 1.005–1.030)
Urobilinogen, UA: 0.2 mg/dL (ref 0.0–1.0)

## 2013-04-30 MED ORDER — ONDANSETRON 4 MG PO TBDP
8.0000 mg | ORAL_TABLET | Freq: Once | ORAL | Status: AC
  Administered 2013-04-30: 8 mg via ORAL
  Filled 2013-04-30: qty 2

## 2013-04-30 MED ORDER — HYDROCODONE-ACETAMINOPHEN 5-325 MG PO TABS
1.0000 | ORAL_TABLET | Freq: Once | ORAL | Status: AC
  Administered 2013-04-30: 1 via ORAL
  Filled 2013-04-30: qty 1

## 2013-04-30 MED ORDER — ONDANSETRON 8 MG PO TBDP: 8.0000 mg | ORAL_TABLET | Freq: Three times a day (TID) | ORAL | Status: DC | PRN

## 2013-04-30 MED ORDER — METRONIDAZOLE 500 MG PO TABS
500.0000 mg | ORAL_TABLET | Freq: Once | ORAL | Status: AC
  Administered 2013-04-30: 500 mg via ORAL
  Filled 2013-04-30: qty 1

## 2013-04-30 MED ORDER — CIPROFLOXACIN HCL 500 MG PO TABS
500.0000 mg | ORAL_TABLET | Freq: Once | ORAL | Status: AC
  Administered 2013-04-30: 500 mg via ORAL
  Filled 2013-04-30: qty 1

## 2013-04-30 MED ORDER — METRONIDAZOLE 500 MG PO TABS: 500.0000 mg | ORAL_TABLET | Freq: Two times a day (BID) | ORAL | Status: DC

## 2013-04-30 MED ORDER — HYDROMORPHONE HCL PF 2 MG/ML IJ SOLN
2.0000 mg | Freq: Once | INTRAMUSCULAR | Status: AC
  Administered 2013-04-30: 2 mg via INTRAMUSCULAR
  Filled 2013-04-30: qty 1

## 2013-04-30 MED ORDER — HYDROCODONE-ACETAMINOPHEN 5-325 MG PO TABS: 1.0000 | ORAL_TABLET | Freq: Four times a day (QID) | ORAL | Status: DC | PRN

## 2013-04-30 MED ORDER — CIPROFLOXACIN HCL 500 MG PO TABS: 500.0000 mg | ORAL_TABLET | Freq: Two times a day (BID) | ORAL | Status: DC

## 2013-04-30 NOTE — ED Provider Notes (Signed)
CSN: [id number]     Arrival date & time 04/29/13  2046 History   First MD Initiated Contact with Patient 04/30/13 0049     Chief Complaint  Patient presents with  . Abdominal Pain   (Consider location/radiation/quality/duration/timing/severity/associated sxs/prior Treatment) HPI Comments: Pt comes in with cc of chest pain. Pt has hx of CAD, DM, Diverticular dz and Renal stones hx. States that he started having abd pain about a week ago. He was lifting a heavy ladder at that time, and thought he might have pulled a muscle. Overtime however, his pain has persisted, and is pretty severe. The pain is left sided, radiating towards the lower quadrant, and staying anterior. He also has right sided groin pain. No scrotal pain. No n/v/f/c/uti like sx/bloody stools.  Patient is a 52 y.o. male presenting with abdominal pain. The history is provided by the patient.  Abdominal Pain Associated symptoms: no chest pain, no cough, no diarrhea, no dysuria, no nausea, no shortness of breath and no vomiting     Past Medical History  Diagnosis Date  . 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 Diabetes mellitus     type II  . Hyperlipidemia   . Hypertension   . Melanoma   . CAD (coronary artery disease)     a. 12/2011 NSTEMI/PCI: Stenting of the OM;  b. 06/2012 NSTEMI/PCI: LCX 100%->BMS;  c. 07/2012 Cath: dissectied LM & LCX with patent LCX/OM stents->CABG x 3: LIMA->LAD, VG->OM1->OM2.  . Shortness of breath   . Aortic insufficiency     a. 07/2012 s/p AVR: #25 Magnaease Bioprosthetic Valve (performed @ time of CABG).  . Diverticulitis    Past Surgical History  Procedure Laterality Date  . Rotator cuff repair      RIGHT  . Knee arthoscopy      LEFT KNEE  . Ulnar tunnel release      RIGHT  . Keratotomies      BILATERAL  . Septoplasty    . Tonsillectomy    . Knee arthroscopy  06/28/2012    Procedure: ARTHROSCOPY KNEE;  Surgeon: Dannielle Huh, MD;   Location: [facility] OR;  Service: Orthopedics;  Laterality: Left;  . Cardiac catheterization  12-22-11    with OM1 Promus stent 12/22/11  . Knee surgery      left  . Eye surgery    . Tee without cardioversion  07/28/2012    Procedure: TRANSESOPHAGEAL ECHOCARDIOGRAM (TEE);  Surgeon: Vesta Mixer, MD;  Location: [facility] ENDOSCOPY;  Service: Cardiovascular;  Laterality: N/A;  . Aortic valve replacement  08/02/2012    Procedure: AORTIC VALVE REPLACEMENT (AVR);  Surgeon: Loreli Slot, MD;  Location: [facility] OR;  Service: Open Heart Surgery;  Laterality: N/A;  . Intraoperative transesophageal echocardiogram  08/02/2012    Procedure: INTRAOPERATIVE TRANSESOPHAGEAL ECHOCARDIOGRAM;  Surgeon: Loreli Slot, MD;  Location: [facility] OR;  Service: Open Heart Surgery;  Laterality: N/A;  . Coronary artery bypass graft  08/02/2012    Procedure: CORONARY ARTERY BYPASS GRAFTING (CABG);  Surgeon: Loreli Slot, MD;  Location: [facility] OR;  Service: Open Heart Surgery;  Laterality: N/A;  . Endovein harvest of greater saphenous vein  08/02/2012    Procedure: ENDOVEIN HARVEST OF GREATER SAPHENOUS VEIN;  Surgeon: Loreli Slot, MD;  Location: [facility] OR;  Service: Open Heart Surgery;  Laterality: Left;  upper and lower leg   Family History  Problem Relation Age of Onset  . Coronary artery disease Mother   . Heart  disease Father   . Heart attack Paternal Grandfather   . Lung cancer Mother   . Diabetes Paternal Grandmother   . Hyperlipidemia Brother   . Hyperlipidemia      father's side  . Melanoma      fh   History  Substance Use Topics  . Smoking status: Never Smoker   . Smokeless tobacco: Never Used  . Alcohol Use: No    Review of Systems  Constitutional: Negative for activity change and appetite change.  Respiratory: Negative for cough and shortness of breath.   Cardiovascular: Negative for chest pain.  Gastrointestinal: Positive for abdominal pain. Negative for nausea, vomiting, diarrhea, blood in stool and  rectal pain.  Genitourinary: Negative for dysuria, flank pain, scrotal swelling and testicular pain.    Allergies  Contrast media  Home Medications   Current Outpatient Rx  Name  Route  Sig  Dispense  Refill  . aspirin 325 MG tablet   Oral   Take 325 mg by mouth daily.         . metoprolol succinate (TOPROL-XL) 25 MG 24 hr tablet   Oral   Take 25 mg by mouth daily.         . metroNIDAZOLE (FLAGYL) 500 MG tablet   Oral   Take 500 mg by mouth once.         . permethrin (ELIMITE) 5 % cream   Topical   Apply 1 application topically daily.         . pioglitazone (ACTOS) 15 MG tablet   Oral   Take 15 mg by mouth daily.         . rosuvastatin (CRESTOR) 10 MG tablet   Oral   Take 10 mg by mouth daily.         . sitaGLIPtan-metformin (JANUMET) 50-1000 MG per tablet   Oral   Take 1 tablet by mouth 2 (two) times daily with a meal.           BP 128/84  Pulse 98  Temp(Src) 98.4 F (36.9 C) (Oral)  Resp 16  Wt 201 lb (91.173 kg)  BMI 29.67 kg/m2  SpO2 99% Physical Exam  Nursing note and vitals reviewed. Constitutional: He is oriented to person, place, and time. He appears well-developed.  HENT:  Head: Normocephalic and atraumatic.  Eyes: Conjunctivae and EOM are normal. Pupils are equal, round, and reactive to light.  Neck: Normal range of motion. Neck supple.  Cardiovascular: Normal rate and regular rhythm.   Pulmonary/Chest: Effort normal and breath sounds normal.  Abdominal: Soft. Bowel sounds are normal. He exhibits no distension. There is tenderness. There is no rebound and no guarding.  Pt has LLQ tenderness, with guarding, no rebound tenderness, no CVA tenderness   Neurological: He is alert and oriented to person, place, and time.  Skin: Skin is warm.    ED Course  Procedures (including critical care time) Labs Review Labs Reviewed  COMPREHENSIVE METABOLIC PANEL - Abnormal; Notable for the following:    Glucose, Bld 268 (*)    All other  components within normal limits  CBC WITH DIFFERENTIAL - Abnormal; Notable for the following:    HCT 36.5 (*)    Platelets 116 (*)    All other components within normal limits  LIPASE, BLOOD  URINALYSIS, ROUTINE W REFLEX MICROSCOPIC  POCT I-STAT TROPONIN I   Imaging Review No results found.  EKG Interpretation   None       MDM  No diagnosis found.  Pt comes in with cc of bilateral abd pain. Hx of DN, diverticular dz, renal stones - 1 of them required surgery. Pt's hx and exam is suggestive for diverticulitis (LLQ tenderness with guarding) vs, renal stones (radiating to groin). CT from July showed diverticulitis and nephrolithiasis. No masses plapated, GU exam is fine, doubt hernia, although muscle tear is possible.  CT ordered.  7:14 AM CT showed diverticulitis. PO antibiotics in the ED provided. Pt is tolerating PO. Will d.c Return precautions discussed.  Derwood Kaplan, MD 04/30/13 [phone]

## 2013-05-26 ENCOUNTER — Other Ambulatory Visit: Payer: Self-pay

## 2013-12-11 IMAGING — CT CT CHEST W/ CM
1 series · 16 of 32 positions shown, 20 images · IV contrast (APPLIED)
Comparison: Prior chest x-ray of 12/20/2011.

REASON FOR EXAM: chest pain  initially better with ntg  then not  better
COMMENTS:

PROCEDURE:     CT  - CT CHEST (FOR PE) W  - December 20, 2011  [DATE]
RESULT:     History: Pain.

[Series 4: soft tissue · axial · 0.66mm/px · z∈[-390,-138]mm · 16 of 95 slices shown, 20 images]
[im 7/95  soft-tissue]
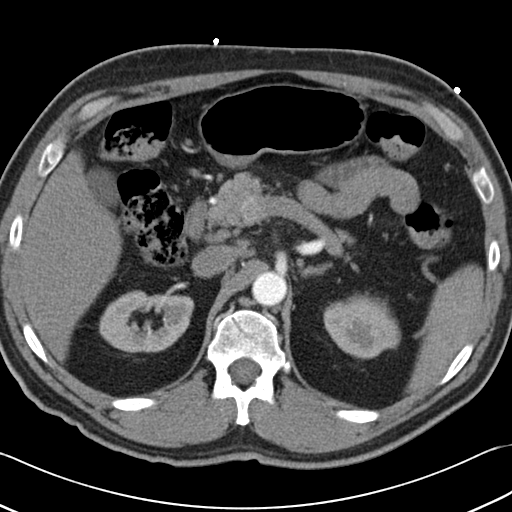
[im 7/95  bone]
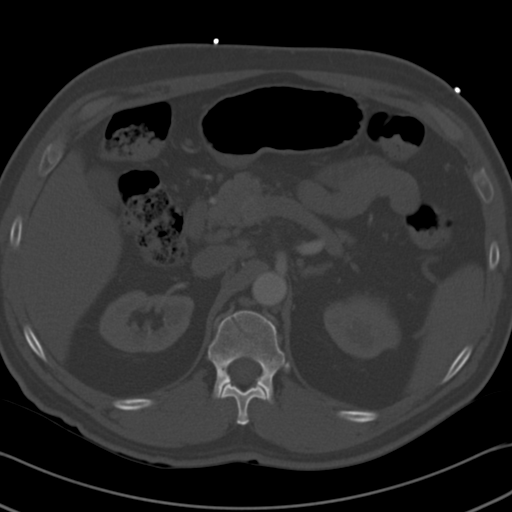
[im 13/95  soft-tissue]
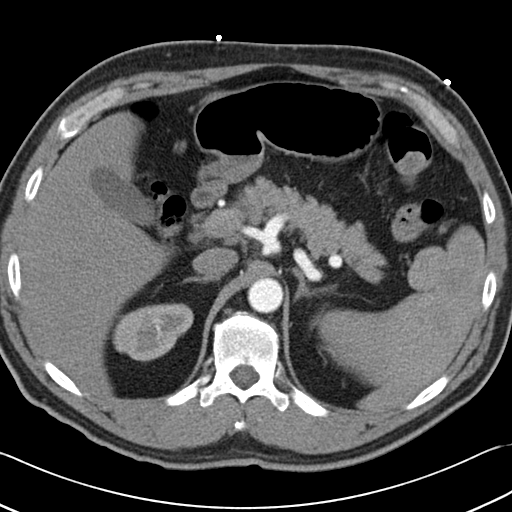
[im 19/95  soft-tissue]
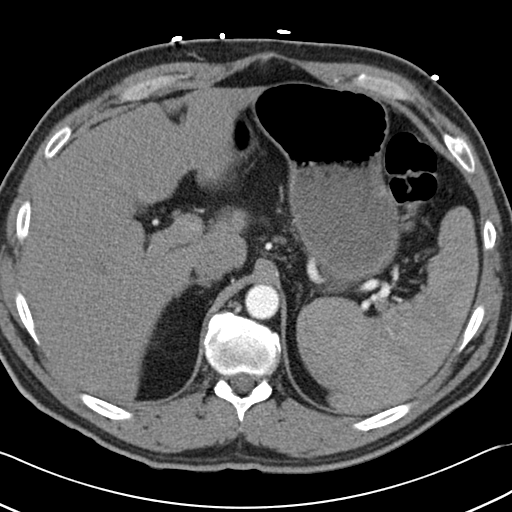
[im 25/95  soft-tissue]
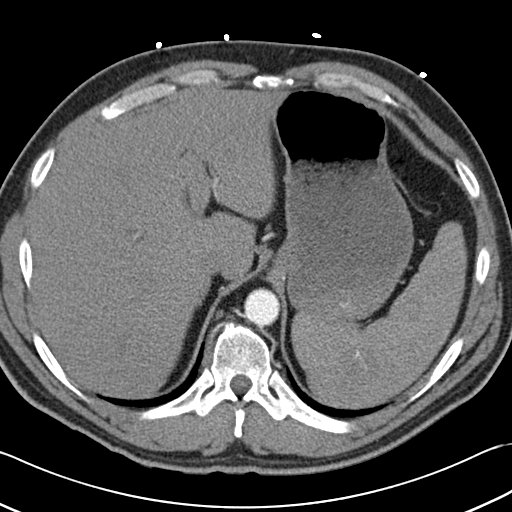
[im 31/95  soft-tissue]
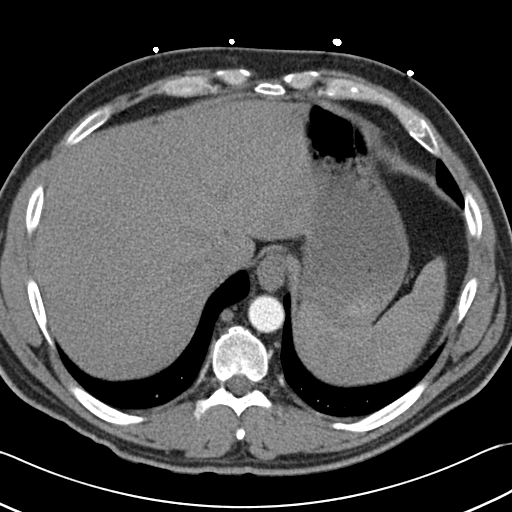
[im 37/95  soft-tissue]
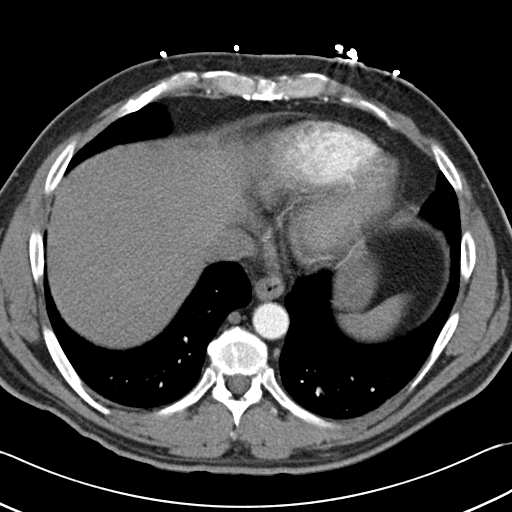
[im 43/95  soft-tissue]
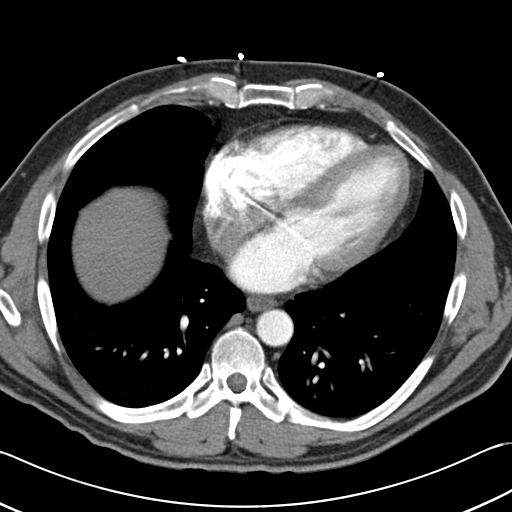
[im 52/95  soft-tissue]
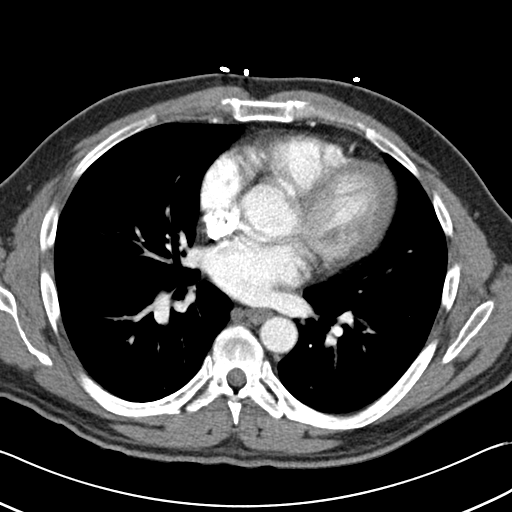
[im 58/95  soft-tissue]
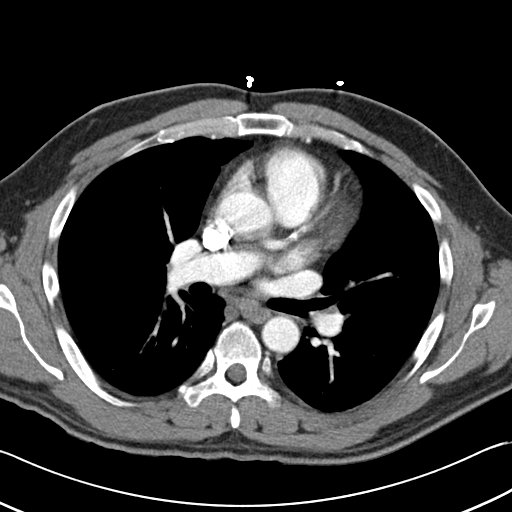
[im 58/95  bone]
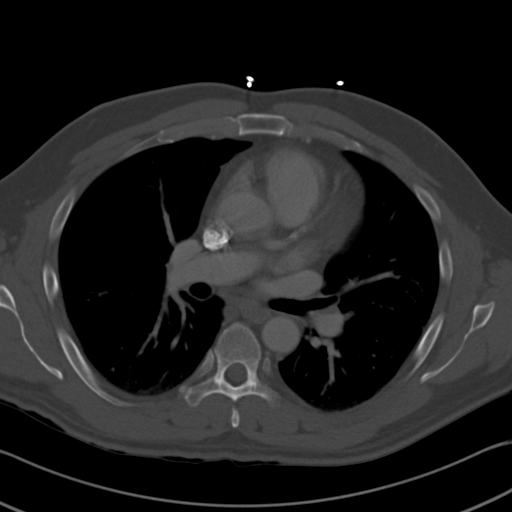
[im 64/95  soft-tissue]
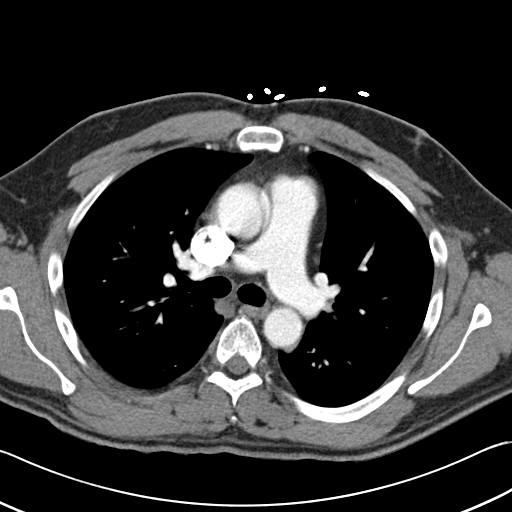
[im 70/95  soft-tissue]
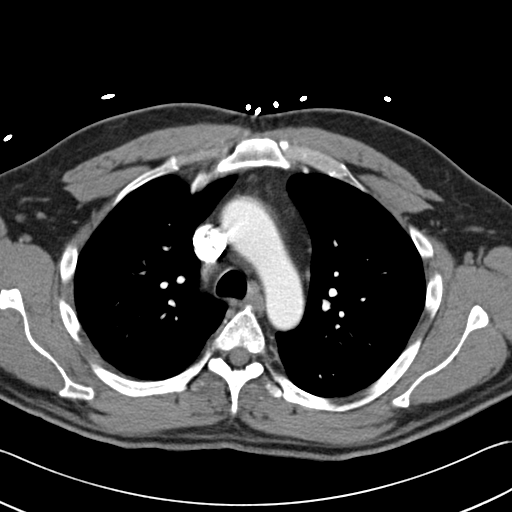
[im 76/95  soft-tissue]
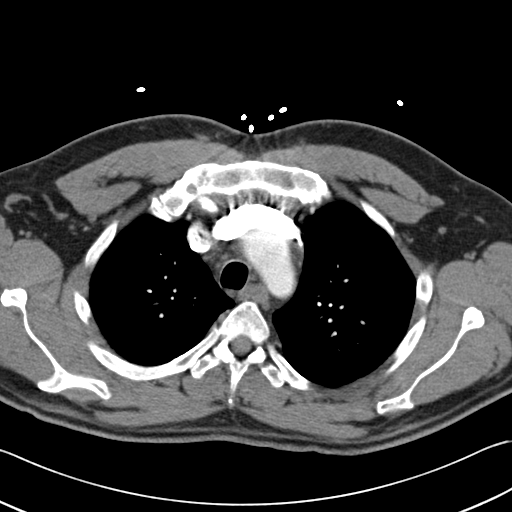
[im 82/95  soft-tissue]
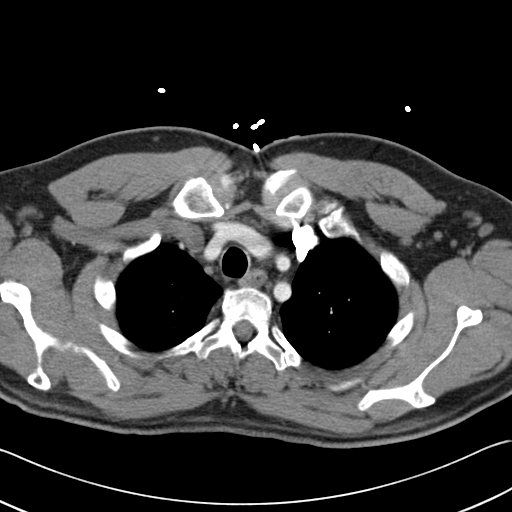
[im 82/95  lung]
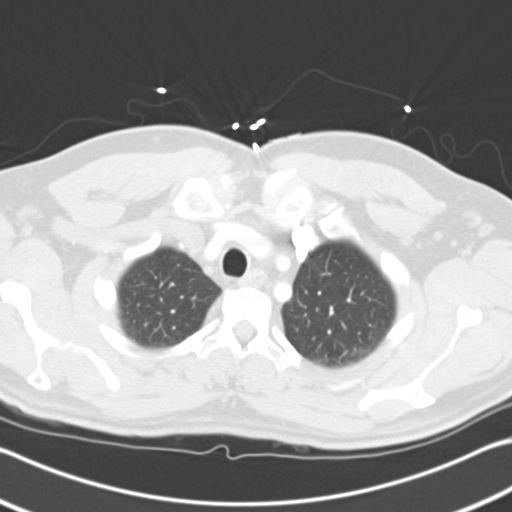
[im 85/95  lung]
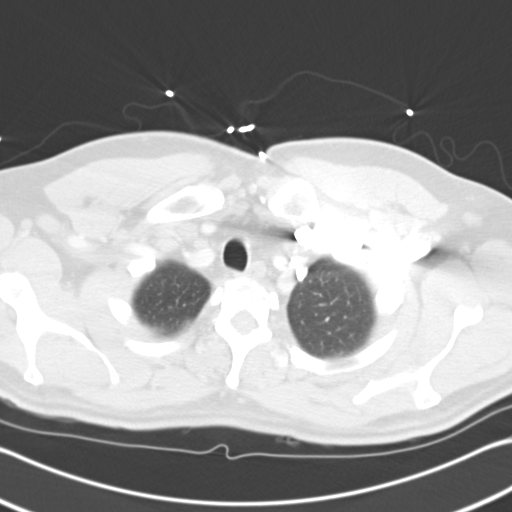
[im 88/95  soft-tissue]
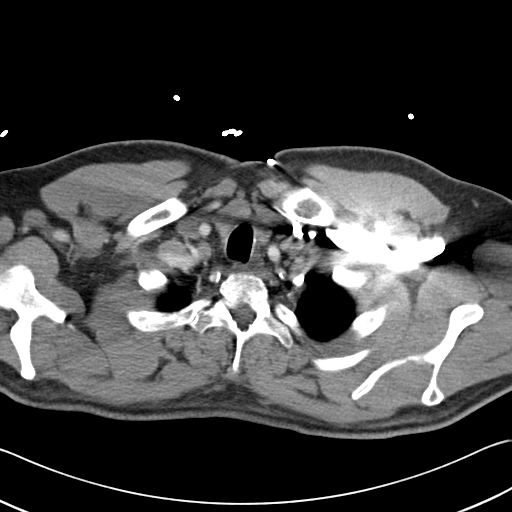
[im 88/95  lung]
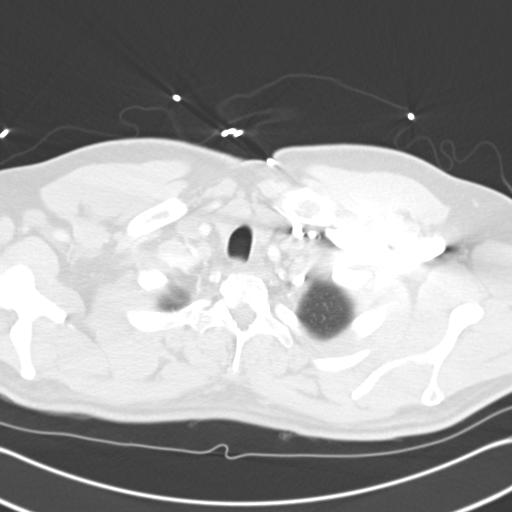
[im 91/95  lung]
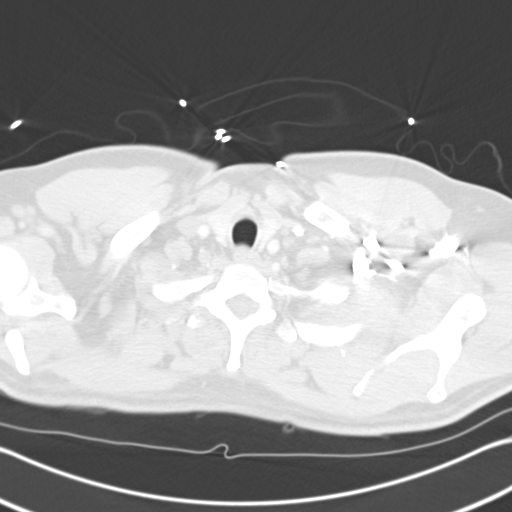

[16 of 32 positions shown; findings below may reference images not displayed]

FINDINGS: Standard CT obtained under cc of [id number]. Motion artifact noted
thoracic aorta. No evidence of dilatation. Coronary artery disease present.
Motion artifact noted in the pulmonary artery. No pulmonary embolus. Large
airways are patent. Atelectasis in the lung bases. Stones noted in kidneys.
IMPRESSION: Coronary artery disease. No pulmonary embolus. Mild
atelectasis lung bases otherwise no acute abnormality.

## 2013-12-11 IMAGING — CR DG CHEST 1V PORT
1 series · 1 of 1 positions shown · non-contrast
Comparison: none

REASON FOR EXAM: Chest Pain
COMMENTS:

PROCEDURE:     DXR - DXR PORTABLE CHEST SINGLE VIEW  - December 20, 2011  [DATE]
RESULT:     The lungs are clear. The cardiovascular structures are
unremarkable.

[portable]
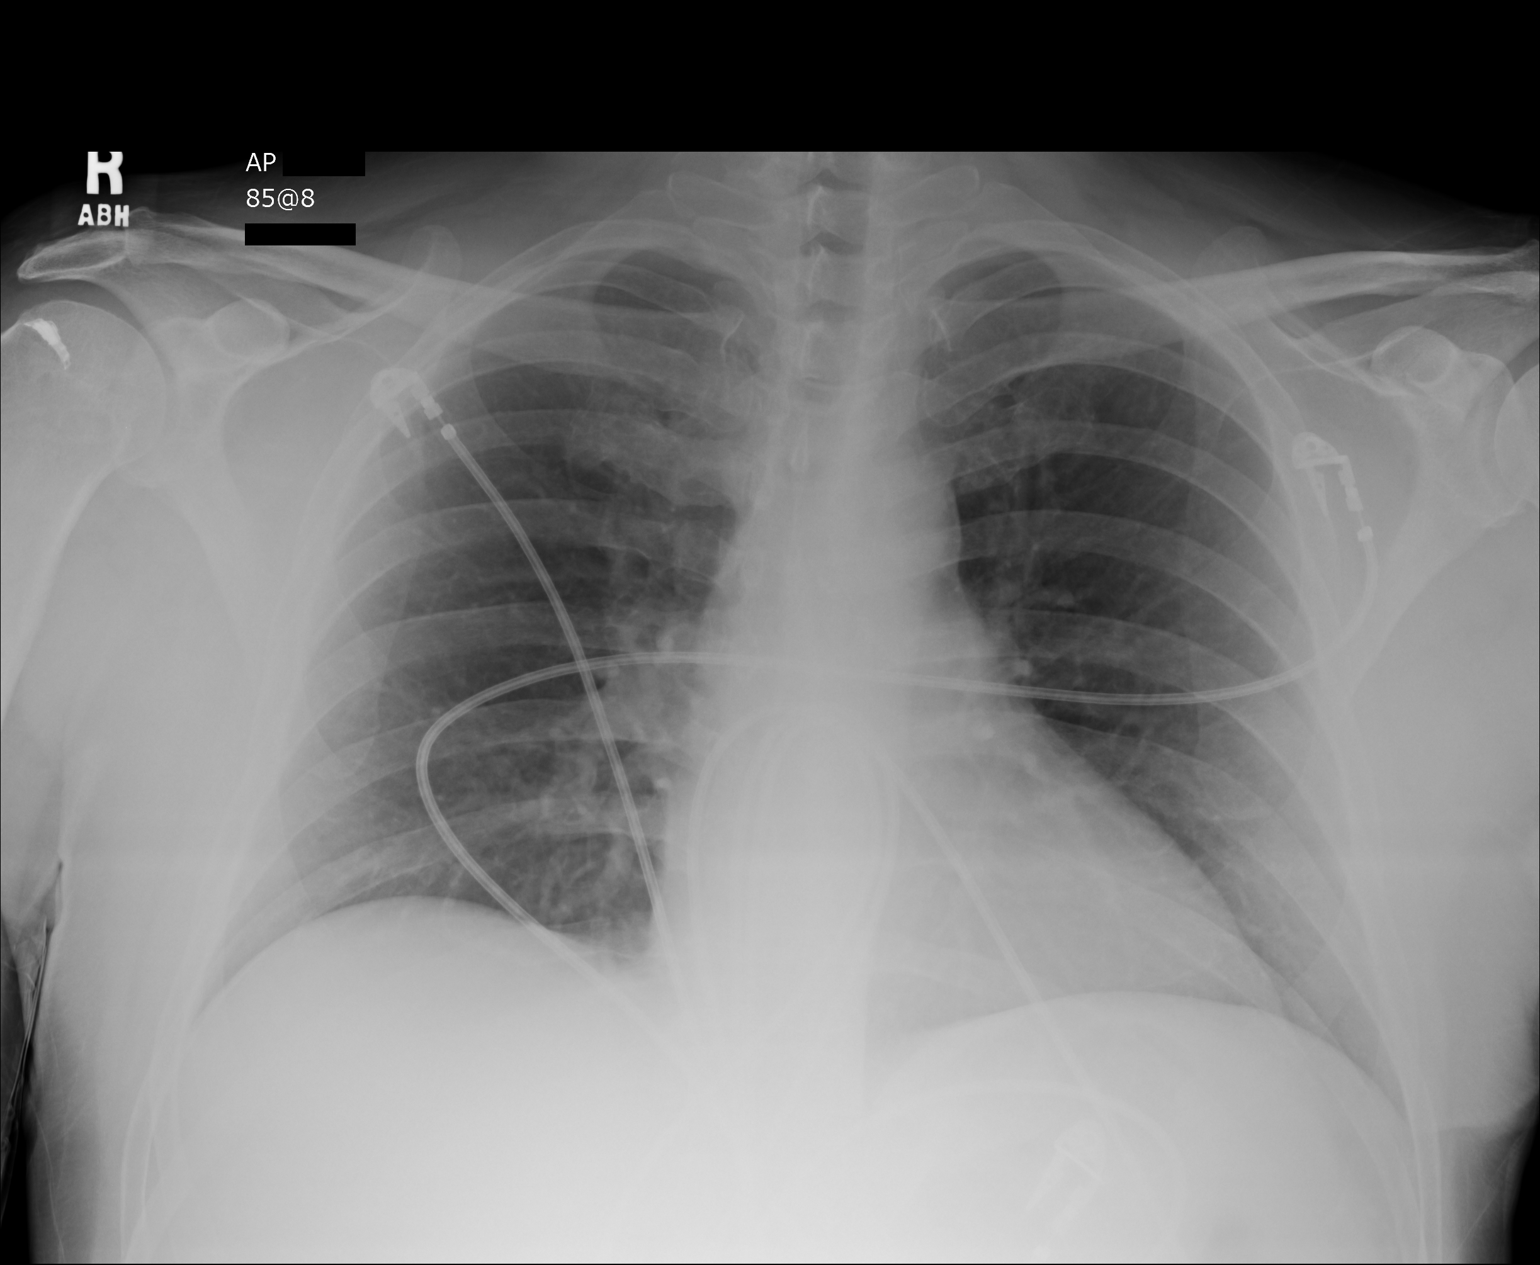

[1 of 1 positions shown; findings below may reference images not displayed]

IMPRESSION: No acute abnormality.

## 2013-12-15 ENCOUNTER — Ambulatory Visit: Payer: BC Managed Care – PPO | Admitting: Interventional Cardiology

## 2014-01-05 ENCOUNTER — Ambulatory Visit (INDEPENDENT_AMBULATORY_CARE_PROVIDER_SITE_OTHER): Payer: BC Managed Care – PPO | Admitting: Interventional Cardiology

## 2014-01-05 ENCOUNTER — Encounter: Payer: Self-pay | Admitting: Interventional Cardiology

## 2014-01-05 VITALS — BP 125/82 | HR 80 | Ht 69.0 in | Wt 217.1 lb

## 2014-01-05 DIAGNOSIS — I252 Old myocardial infarction: Secondary | ICD-10-CM

## 2014-01-05 DIAGNOSIS — I359 Nonrheumatic aortic valve disorder, unspecified: Secondary | ICD-10-CM

## 2014-01-05 DIAGNOSIS — Z954 Presence of other heart-valve replacement: Secondary | ICD-10-CM

## 2014-01-05 DIAGNOSIS — I351 Nonrheumatic aortic (valve) insufficiency: Secondary | ICD-10-CM

## 2014-01-05 DIAGNOSIS — E785 Hyperlipidemia, unspecified: Secondary | ICD-10-CM

## 2014-01-05 DIAGNOSIS — Z952 Presence of prosthetic heart valve: Secondary | ICD-10-CM

## 2014-01-05 MED ORDER — ROSUVASTATIN CALCIUM 10 MG PO TABS: 10.0000 mg | ORAL_TABLET | ORAL | Status: DC

## 2014-01-05 NOTE — Progress Notes (Signed)
Patient ID: Roy [name], male   DOB: 30-May-1961, 53 y.o.   MRN: [id number]    [place], [place], [place]  [address] Phone: [phone] Fax:  [phone]  Date:  01/05/2014   ID:  Roy [name], DOB Jan 11, 1961, MRN [id number]  PCP:  Vidal Schwalbe, MD      History of Present Illness: Roy [name] is a 53 y.o. male who has a family h/o CAD. He had an MI in 6/13 with a stent placed, with jaw pain and chest pain. In 12/13, he had knee surgery and 2 days later, he had an MI. He had a stent placed at that time. 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 He feels that the valve was damaged during the cath and wanted a new cardiologist, so he saw me for the first time in 2014. He feels better now and had a tissue valve due to not wanting to take coumadin. He still has some soreness where his vein harvest was done. CAD/ASCVD:  Rare pain at the incision lasting a few seconds. Nothing like prior MI. Denies : Chest pain.  Diaphoresis.  Dizziness.  Dyspnea on exertion.  Leg edema.  Nitroglycerin.  Orthopnea.  Palpitations.  Syncope.   He did not tolerate daily statins in the past.  He had joint pains.   Wt Readings from Last 3 Encounters:  01/05/14 217 lb 1.9 oz (98.485 kg)  04/29/13 201 lb (91.173 kg)  04/04/13 197 lb (89.359 kg)     Past Medical History  Diagnosis Date  . Secondary and unspecified malignant neoplasm of lymph nodes, site unspecified   . Diabetes mellitus     type II  . Hyperlipidemia   . Hypertension   . Melanoma   . CAD (coronary artery disease)     a. 12/2011 NSTEMI/PCI: Stenting of the OM;  b. 06/2012 NSTEMI/PCI: LCX 100%->BMS;  c. 07/2012 Cath: dissectied LM & LCX with patent LCX/OM stents->CABG x 3: LIMA->LAD, VG->OM1->OM2.  . Shortness of breath   . Aortic insufficiency     a. 07/2012 s/p AVR: #25 Magnaease Bioprosthetic Valve (performed @ time of CABG).  . Diverticulitis     Current Outpatient  Prescriptions  Medication Sig Dispense Refill  . aspirin 325 MG tablet Take 325 mg by mouth daily.      Marland Kitchen HYDROcodone-acetaminophen (NORCO/VICODIN) 5-325 MG per tablet Take 1 tablet by mouth every 6 (six) hours as needed for pain.  15 tablet  0  . losartan (COZAAR) 25 MG tablet Take 25 mg by mouth daily.      . metoprolol succinate (TOPROL-XL) 25 MG 24 hr tablet Take 25 mg by mouth daily.      . pioglitazone (ACTOS) 15 MG tablet Take 30 mg by mouth daily.       . sitaGLIPtan-metformin (JANUMET) 50-1000 MG per tablet Take 1 tablet by mouth 2 (two) times daily with a meal.        No current facility-administered medications for this visit.    Allergies:    Allergies  Allergen Reactions  . Contrast Media [Iodinated Diagnostic Agents] Other (See Comments)    Pt'shad streaks up and down his arms    Social History:  The patient  reports that he has never smoked. He has never used smokeless tobacco. He reports that he does not drink alcohol or use illicit drugs.   Family History:  The patient's family history includes  Coronary artery disease in his mother; Diabetes in his paternal grandmother; Heart attack in his paternal grandfather; Heart disease in his father; Hyperlipidemia in his brother and another family member; Lung cancer in his mother; Melanoma in an other family member.   ROS:  Please see the history of present illness.  No nausea, vomiting.  No fevers, chills.  No focal weakness.  No dysuria.    All other systems reviewed and negative.   PHYSICAL EXAM: VS:  BP 125/82  Pulse 80  Ht 5\' 9"  (1.753 m)  Wt 217 lb 1.9 oz (98.485 kg)  BMI 32.05 kg/m2 Well nourished, well developed, in no acute distress HEENT: normal Neck: no JVD, no carotid bruits Cardiac:  normal S1, S2; RRR; 1/6 systolic murmur Lungs:  clear to auscultation bilaterally, no wheezing, rhonchi or rales Abd: soft, nontender, no hepatomegaly Ext: no edema Skin: warm and dry Neuro:   no focal abnormalities  noted  EKG:  NSR, NSST in 2014 October  ASSESSMENT AND PLAN:  CAD  Continue Aspirin Tablet, 81 MG, 1 tablet, Orally, Once a day Notes: No angina. s/p CABG. Prior PCI before CABG.   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 Dyslipidemia  - stopped crestor on his own.   Start Crestor Tablet, 10 mg, 1 tablet, Orally, Once a week. Last LDL in 6/15 was 101.  LDL target 70 given prior MI.  Will have pharm D look at other potential options for treatment.   4. Aortic insufficiency  Notes: Needs regular dental cleanings. Use amoxicillin for SBE prophylaxis. s/p tissue AVR.    Preventive Medicine  Adult topics discussed:  Diet: weight loss, healthy diet.  Exercise: 5 days a week, at least 30 minutes of aerobic exercise.    Follow Up  1 Year (Reason: CAD/AI)     Signed, Mina Marble, MD, [facility] 01/05/2014 9:36 AM

## 2014-01-05 NOTE — Patient Instructions (Signed)
Your physician has recommended you make the following change in your medication: start taking Crestor 10 mg once weekly  Your physician wants you to follow-up in: 1 year. You will receive a reminder letter in the mail two months in advance. If you don't receive a letter, please call our office to schedule the follow-up appointment.

## 2014-01-06 DIAGNOSIS — I252 Old myocardial infarction: Secondary | ICD-10-CM | POA: Insufficient documentation

## 2014-02-09 ENCOUNTER — Emergency Department (INDEPENDENT_AMBULATORY_CARE_PROVIDER_SITE_OTHER)
Admission: EM | Admit: 2014-02-09 | Discharge: 2014-02-09 | Disposition: A | Discharge status: Home / Self Care | Payer: BC Managed Care – PPO | Source: Home / Self Care | Attending: Family Medicine | Admitting: Family Medicine

## 2014-02-09 ENCOUNTER — Encounter (HOSPITAL_COMMUNITY): Payer: Self-pay | Admitting: Emergency Medicine

## 2014-02-09 DIAGNOSIS — T6391XA Toxic effect of contact with unspecified venomous animal, accidental (unintentional), initial encounter: Secondary | ICD-10-CM

## 2014-02-09 DIAGNOSIS — T63461A Toxic effect of venom of wasps, accidental (unintentional), initial encounter: Secondary | ICD-10-CM

## 2014-02-09 MED ORDER — HYDROXYZINE HCL 25 MG PO TABS: 25.0000 mg | ORAL_TABLET | Freq: Three times a day (TID) | ORAL | Status: DC | PRN

## 2014-02-09 MED ORDER — FAMOTIDINE 20 MG PO TABS
ORAL_TABLET | ORAL | Status: AC
  Filled 2014-02-09: qty 1

## 2014-02-09 MED ORDER — PREDNISONE 10 MG PO TABS: ORAL_TABLET | ORAL | Status: DC

## 2014-02-09 MED ORDER — FAMOTIDINE 20 MG PO TABS: 20.0000 mg | ORAL_TABLET | Freq: Two times a day (BID) | ORAL | Status: DC

## 2014-02-09 MED ORDER — METHYLPREDNISOLONE SODIUM SUCC 125 MG IJ SOLR
60.0000 mg | Freq: Once | INTRAMUSCULAR | Status: AC
  Administered 2014-02-09: 60 mg via INTRAMUSCULAR

## 2014-02-09 MED ORDER — DIPHENHYDRAMINE HCL 50 MG/ML IJ SOLN
50.0000 mg | Freq: Once | INTRAMUSCULAR | Status: AC
  Administered 2014-02-09: 50 mg via INTRAMUSCULAR

## 2014-02-09 MED ORDER — DIPHENHYDRAMINE HCL 50 MG/ML IJ SOLN
INTRAMUSCULAR | Status: AC
  Filled 2014-02-09: qty 1

## 2014-02-09 MED ORDER — DIPHENHYDRAMINE HCL 25 MG PO CAPS: 50.0000 mg | ORAL_CAPSULE | Freq: Once | ORAL | Status: DC

## 2014-02-09 MED ORDER — METHYLPREDNISOLONE SODIUM SUCC 125 MG IJ SOLR
INTRAMUSCULAR | Status: AC
  Filled 2014-02-09: qty 2

## 2014-02-09 MED ORDER — FAMOTIDINE 20 MG PO TABS
20.0000 mg | ORAL_TABLET | Freq: Once | ORAL | Status: AC
  Administered 2014-02-09: 20 mg via ORAL

## 2014-02-09 NOTE — Discharge Instructions (Signed)

## 2014-02-09 NOTE — ED Provider Notes (Signed)
Medical screening examination/treatment/procedure(s) were performed by a resident physician or non-physician practitioner and as the supervising physician I was immediately available for consultation/collaboration.  David Merrell, MD Family Medicine   David J Merrell, MD 02/09/14 2146 

## 2014-02-09 NOTE — ED Provider Notes (Signed)
CSN: [id number]     Arrival date & time 02/09/14  1924 History   First MD Initiated Contact with Patient 02/09/14 2002     Chief Complaint  Patient presents with  . hand swelling    (Consider location/radiation/quality/duration/timing/severity/associated sxs/prior Treatment) HPI Comments: Patient states he was stung by at least two red wasps while at work at [employer] today. He was stung on his left hand. States hand is now red and swollen. Has not taken any medication for condition prior to arrival. Denies swelling of face, lips, tongue or throat. Denies difficulty breathing, speaking or swallowing.  PCP: Dr. Deland Pretty   The history is provided by the patient.    Past Medical History  Diagnosis Date  . Secondary and unspecified malignant neoplasm of lymph nodes, site unspecified   . Diabetes mellitus     type II  . Hyperlipidemia   . Hypertension   . Melanoma   . CAD (coronary artery disease)     a. 12/2011 NSTEMI/PCI: Stenting of the OM;  b. 06/2012 NSTEMI/PCI: LCX 100%->BMS;  c. 07/2012 Cath: dissectied LM & LCX with patent LCX/OM stents->CABG x 3: LIMA->LAD, VG->OM1->OM2.  . Shortness of breath   . Aortic insufficiency     a. 07/2012 s/p AVR: #25 Magnaease Bioprosthetic Valve (performed @ time of CABG).  . Diverticulitis    Past Surgical History  Procedure Laterality Date  . Rotator cuff repair      RIGHT  . Knee arthoscopy      LEFT KNEE  . Ulnar tunnel release      RIGHT  . Keratotomies      BILATERAL  . Septoplasty    . Tonsillectomy    . Knee arthroscopy  06/28/2012    Procedure: ARTHROSCOPY KNEE;  Surgeon: Vickey Huger, MD;  Location: [place];  Service: Orthopedics;  Laterality: Left;  . Cardiac catheterization  12-22-11    with OM1 Promus stent 12/22/11  . Knee surgery      left  . Eye surgery    . Tee without cardioversion  07/28/2012    Procedure: TRANSESOPHAGEAL ECHOCARDIOGRAM (TEE);  Surgeon: Thayer Headings, MD;  Location: [place];  Service: Cardiovascular;   Laterality: N/A;  . Aortic valve replacement  08/02/2012    Procedure: AORTIC VALVE REPLACEMENT (AVR);  Surgeon: Melrose Nakayama, MD;  Location: [place];  Service: Open Heart Surgery;  Laterality: N/A;  . Intraoperative transesophageal echocardiogram  08/02/2012    Procedure: INTRAOPERATIVE TRANSESOPHAGEAL ECHOCARDIOGRAM;  Surgeon: Melrose Nakayama, MD;  Location: [place];  Service: Open Heart Surgery;  Laterality: N/A;  . Coronary artery bypass graft  08/02/2012    Procedure: CORONARY ARTERY BYPASS GRAFTING (CABG);  Surgeon: Melrose Nakayama, MD;  Location: [place];  Service: Open Heart Surgery;  Laterality: N/A;  . Endovein harvest of greater saphenous vein  08/02/2012    Procedure: ENDOVEIN HARVEST OF GREATER SAPHENOUS VEIN;  Surgeon: Melrose Nakayama, MD;  Location: [place];  Service: Open Heart Surgery;  Laterality: Left;  upper and lower leg   Family History  Problem Relation Age of Onset  . Coronary artery disease Mother   . Heart disease Father   . Heart attack Paternal Grandfather   . Lung cancer Mother   . Diabetes Paternal Grandmother   . Hyperlipidemia Brother   . Hyperlipidemia      father's side  . Melanoma      fh   History  Substance Use Topics  . Smoking status: Never  Smoker   . Smokeless tobacco: Never Used  . Alcohol Use: No    Review of Systems  All other systems reviewed and are negative.   Allergies  Contrast media  Home Medications   Prior to Admission medications   Medication Sig Start Date End Date Taking? Authorizing Provider  aspirin 325 MG tablet Take 325 mg by mouth daily.    Historical Provider, MD  famotidine (PEPCID) 20 MG tablet Take 1 tablet (20 mg total) by mouth 2 (two) times daily. X 4 days 02/09/14   Lahoma Rocker, PA  HYDROcodone-acetaminophen (NORCO/VICODIN) 5-325 MG per tablet Take 1 tablet by mouth every 6 (six) hours as needed for pain. 04/30/13   Varney Biles, MD  hydrOXYzine (ATARAX/VISTARIL) 25 MG tablet Take 1  tablet (25 mg total) by mouth 3 (three) times daily as needed for itching. 02/09/14   Lahoma Rocker, PA  losartan (COZAAR) 25 MG tablet Take 25 mg by mouth daily.    Historical Provider, MD  metoprolol succinate (TOPROL-XL) 25 MG 24 hr tablet Take 25 mg by mouth daily. 07/15/12   Minna Merritts, MD  pioglitazone (ACTOS) 15 MG tablet Take 30 mg by mouth daily.     Historical Provider, MD  predniSONE (DELTASONE) 10 MG tablet Beginning 02/10/2014, take 3 po QD day 1, two po QD day 2 and 1 po QD day 3 then stop 02/09/14   Lahoma Rocker, PA  rosuvastatin (CRESTOR) 10 MG tablet Take 1 tablet (10 mg total) by mouth once a week. 01/05/14   Jettie Booze, MD  sitaGLIPtan-metformin (JANUMET) 50-1000 MG per tablet Take 1 tablet by mouth 2 (two) times daily with a meal.     Historical Provider, MD   BP 143/91  Pulse 78  Temp(Src) 98.4 F (36.9 C) (Oral)  Resp 16  SpO2 99% Physical Exam  Nursing note and vitals reviewed. Constitutional: He is oriented to person, place, and time. He appears well-developed and well-nourished. No distress.  HENT:  Head: Normocephalic and atraumatic.  Right Ear: Hearing and external ear normal.  Left Ear: Hearing and external ear normal.  Nose: Nose normal.  Mouth/Throat: Uvula is midline, oropharynx is clear and moist and mucous membranes are normal. No trismus in the jaw. No uvula swelling.  Eyes: Conjunctivae are normal. No scleral icterus.  Cardiovascular: Normal rate, regular rhythm and normal heart sounds.   Pulmonary/Chest: Effort normal and breath sounds normal. He has no wheezes.  Musculoskeletal:       Hands: Neurological: He is alert and oriented to person, place, and time.  Skin: Skin is warm and dry.  Psychiatric: He has a normal mood and affect. His behavior is normal.    ED Course  Procedures (including critical care time) Labs Review Labs Reviewed - No data to display  Imaging Review No results found.   MDM   1. Wasp  sting, accidental or unintentional, initial encounter   Patient given 50 mg IM benadryl, 20 mg oral pepcid and Solumedrol 60mg  IM. Will provide additional treatment at home with 3 day course of prednisone, pepcid and atarax. Advised to keep hand elevated and apply ice to reduce swelling.     [place], [place] 02/09/14 2123

## 2014-02-09 NOTE — ED Notes (Signed)
Patient states was stung by "red" wasps about four hours ago Left hand is red and swollen and getting worse

## 2014-03-02 ENCOUNTER — Encounter (HOSPITAL_BASED_OUTPATIENT_CLINIC_OR_DEPARTMENT_OTHER): Payer: Self-pay | Admitting: Emergency Medicine

## 2014-03-02 ENCOUNTER — Emergency Department (HOSPITAL_BASED_OUTPATIENT_CLINIC_OR_DEPARTMENT_OTHER)
Admission: EM | Admit: 2014-03-02 | Discharge: 2014-03-02 | Disposition: A | Discharge status: Home / Self Care | Payer: BC Managed Care – PPO | Attending: Emergency Medicine | Admitting: Emergency Medicine

## 2014-03-02 DIAGNOSIS — E119 Type 2 diabetes mellitus without complications: Secondary | ICD-10-CM | POA: Diagnosis not present

## 2014-03-02 DIAGNOSIS — Z951 Presence of aortocoronary bypass graft: Secondary | ICD-10-CM | POA: Insufficient documentation

## 2014-03-02 DIAGNOSIS — E785 Hyperlipidemia, unspecified: Secondary | ICD-10-CM | POA: Insufficient documentation

## 2014-03-02 DIAGNOSIS — Z9889 Other specified postprocedural states: Secondary | ICD-10-CM | POA: Diagnosis not present

## 2014-03-02 DIAGNOSIS — I2510 Atherosclerotic heart disease of native coronary artery without angina pectoris: Secondary | ICD-10-CM | POA: Diagnosis not present

## 2014-03-02 DIAGNOSIS — Z87898 Personal history of other specified conditions: Secondary | ICD-10-CM | POA: Diagnosis not present

## 2014-03-02 DIAGNOSIS — Z8719 Personal history of other diseases of the digestive system: Secondary | ICD-10-CM | POA: Insufficient documentation

## 2014-03-02 DIAGNOSIS — Z85820 Personal history of malignant melanoma of skin: Secondary | ICD-10-CM | POA: Insufficient documentation

## 2014-03-02 DIAGNOSIS — I10 Essential (primary) hypertension: Secondary | ICD-10-CM | POA: Diagnosis not present

## 2014-03-02 DIAGNOSIS — R109 Unspecified abdominal pain: Secondary | ICD-10-CM | POA: Diagnosis present

## 2014-03-02 DIAGNOSIS — N200 Calculus of kidney: Secondary | ICD-10-CM | POA: Diagnosis not present

## 2014-03-02 DIAGNOSIS — Z79899 Other long term (current) drug therapy: Secondary | ICD-10-CM | POA: Insufficient documentation

## 2014-03-02 DIAGNOSIS — Z7982 Long term (current) use of aspirin: Secondary | ICD-10-CM | POA: Insufficient documentation

## 2014-03-02 DIAGNOSIS — IMO0002 Reserved for concepts with insufficient information to code with codable children: Secondary | ICD-10-CM | POA: Diagnosis not present

## 2014-03-02 HISTORY — DX: Disorder of kidney and ureter, unspecified: N28.9

## 2014-03-02 LAB — URINALYSIS, ROUTINE W REFLEX MICROSCOPIC
Bilirubin Urine: NEGATIVE
Glucose, UA: NEGATIVE mg/dL
Ketones, ur: NEGATIVE mg/dL
LEUKOCYTES UA: NEGATIVE
NITRITE: NEGATIVE
Protein, ur: NEGATIVE mg/dL
SPECIFIC GRAVITY, URINE: 1.021 (ref 1.005–1.030)
UROBILINOGEN UA: 1 mg/dL (ref 0.0–1.0)
pH: 6 (ref 5.0–8.0)

## 2014-03-02 LAB — BASIC METABOLIC PANEL
Anion gap: 13 (ref 5–15)
BUN: 18 mg/dL (ref 6–23)
CALCIUM: 10.2 mg/dL (ref 8.4–10.5)
CHLORIDE: 104 meq/L (ref 96–112)
CO2: 26 mEq/L (ref 19–32)
Creatinine, Ser: 1.3 mg/dL (ref 0.50–1.35)
GFR calc Af Amer: 71 mL/min — ABNORMAL LOW (ref >90)
GFR calc non Af Amer: 62 mL/min — ABNORMAL LOW (ref >90)
Glucose, Bld: 132 mg/dL — ABNORMAL HIGH (ref 70–99)
POTASSIUM: 5 meq/L (ref 3.7–5.3)
Sodium: 143 mEq/L (ref 137–147)

## 2014-03-02 LAB — URINE MICROSCOPIC-ADD ON

## 2014-03-02 LAB — CBC
HCT: 39.1 % (ref 39.0–52.0)
Hemoglobin: 13.2 g/dL (ref 13.0–17.0)
MCH: 30.8 pg (ref 26.0–34.0)
MCHC: 33.8 g/dL (ref 30.0–36.0)
MCV: 91.1 fL (ref 78.0–100.0)
PLATELETS: 131 10*3/uL — AB (ref 150–400)
RBC: 4.29 MIL/uL (ref 4.22–5.81)
RDW: 13.8 % (ref 11.5–15.5)
WBC: 4.9 10*3/uL (ref 4.0–10.5)

## 2014-03-02 MED ORDER — KETOROLAC TROMETHAMINE 30 MG/ML IJ SOLN
30.0000 mg | Freq: Once | INTRAMUSCULAR | Status: AC
  Administered 2014-03-02: 30 mg via INTRAVENOUS

## 2014-03-02 MED ORDER — TAMSULOSIN HCL 0.4 MG PO CAPS: 0.4000 mg | ORAL_CAPSULE | Freq: Every day | ORAL | Status: DC

## 2014-03-02 MED ORDER — KETOROLAC TROMETHAMINE 30 MG/ML IJ SOLN
INTRAMUSCULAR | Status: AC
  Administered 2014-03-02: 30 mg via INTRAVENOUS
  Filled 2014-03-02: qty 1

## 2014-03-02 MED ORDER — TAMSULOSIN HCL 0.4 MG PO CAPS
0.4000 mg | ORAL_CAPSULE | Freq: Once | ORAL | Status: AC
  Administered 2014-03-02: 0.4 mg via ORAL
  Filled 2014-03-02: qty 1

## 2014-03-02 MED ORDER — ONDANSETRON HCL 4 MG/2ML IJ SOLN
4.0000 mg | Freq: Once | INTRAMUSCULAR | Status: AC
Start: 2014-03-02 — End: 2014-03-02
  Administered 2014-03-02: 4 mg via INTRAVENOUS
  Filled 2014-03-02: qty 2

## 2014-03-02 MED ORDER — HYDROMORPHONE HCL PF 1 MG/ML IJ SOLN
INTRAMUSCULAR | Status: AC
  Administered 2014-03-02: 1 mg via INTRAVENOUS
  Filled 2014-03-02: qty 1

## 2014-03-02 MED ORDER — SODIUM CHLORIDE 0.9 % IV BOLUS (SEPSIS)
1000.0000 mL | Freq: Once | INTRAVENOUS | Status: AC
  Administered 2014-03-02: 1000 mL via INTRAVENOUS

## 2014-03-02 MED ORDER — OXYCODONE-ACETAMINOPHEN 5-325 MG PO TABS
1.0000 | ORAL_TABLET | Freq: Four times a day (QID) | ORAL | Status: DC | PRN
Start: 2014-03-02 — End: 2015-10-11

## 2014-03-02 MED ORDER — HYDROMORPHONE HCL PF 1 MG/ML IJ SOLN
1.0000 mg | Freq: Once | INTRAMUSCULAR | Status: AC
  Administered 2014-03-02: 1 mg via INTRAVENOUS
  Filled 2014-03-02: qty 1

## 2014-03-02 MED ORDER — HYDROMORPHONE HCL PF 1 MG/ML IJ SOLN
1.0000 mg | Freq: Once | INTRAMUSCULAR | Status: AC
  Administered 2014-03-02: 1 mg via INTRAVENOUS

## 2014-03-02 NOTE — Discharge Instructions (Signed)

## 2014-03-02 NOTE — ED Notes (Signed)
Pt c/o right flank pain that began at 1800 hours today. He has had 15 prior kidney stones and sts that this feels the same.

## 2014-03-02 NOTE — ED Provider Notes (Addendum)
CSN: [id number]     Arrival date & time 03/02/14  1950 History  This chart was scribed for Evelina Bucy, MD by Einar Pheasant, ED Scribe. This patient was seen in room MH04/MH04 and the patient's care was started at 8:06 PM.    Chief Complaint  Patient presents with  . Flank Pain   Patient is a 53 y.o. male presenting with flank pain. The history is provided by the patient. No language interpreter was used.  Flank Pain This is a recurrent problem. The current episode started 1 to 2 hours ago. The problem occurs constantly. The problem has been gradually worsening. Pertinent negatives include no chest pain, no abdominal pain, no headaches and no shortness of breath. The symptoms are aggravated by exertion. Nothing relieves the symptoms. He has tried nothing for the symptoms.   HPI Comments: Roy [name] is a 53 y.o. male who presents to the Emergency Department complaining of worsening right sided flank pain that started approximately at [facility] today. Pt states that he has a history of kidneys stones and this current episode feels very similar to his past ones. He states that he has been drinking a lot of city water and he thinks that it is what caused his past 15 kidney stones. He states that he had a lithotripsy with one, but the rest he passed on his own. Wife states that pt has been nauseous. He denies any fever, chills, emesis, congestion, headaches, visual disturbances, or SOB.   Past Medical History  Diagnosis Date  . Secondary and unspecified malignant neoplasm of lymph nodes, site unspecified   . Diabetes mellitus     type II  . Hyperlipidemia   . Hypertension   . Melanoma   . CAD (coronary artery disease)     a. 12/2011 NSTEMI/PCI: Stenting of the OM;  b. 06/2012 NSTEMI/PCI: LCX 100%->BMS;  c. 07/2012 Cath: dissectied LM & LCX with patent LCX/OM stents->CABG x 3: LIMA->LAD, VG->OM1->OM2.  . Shortness of breath   . Aortic insufficiency     a. 07/2012 s/p AVR: #25 Magnaease Bioprosthetic  Valve (performed @ time of CABG).  . Diverticulitis   . Renal disorder    Past Surgical History  Procedure Laterality Date  . Rotator cuff repair      RIGHT  . Knee arthoscopy      LEFT KNEE  . Ulnar tunnel release      RIGHT  . Keratotomies      BILATERAL  . Septoplasty    . Tonsillectomy    . Knee arthroscopy  06/28/2012    Procedure: ARTHROSCOPY KNEE;  Surgeon: Vickey Huger, MD;  Location: [place];  Service: Orthopedics;  Laterality: Left;  . Cardiac catheterization  12-22-11    with OM1 Promus stent 12/22/11  . Knee surgery      left  . Eye surgery    . Tee without cardioversion  07/28/2012    Procedure: TRANSESOPHAGEAL ECHOCARDIOGRAM (TEE);  Surgeon: Thayer Headings, MD;  Location: [place];  Service: Cardiovascular;  Laterality: N/A;  . Aortic valve replacement  08/02/2012    Procedure: AORTIC VALVE REPLACEMENT (AVR);  Surgeon: Melrose Nakayama, MD;  Location: [place];  Service: Open Heart Surgery;  Laterality: N/A;  . Intraoperative transesophageal echocardiogram  08/02/2012    Procedure: INTRAOPERATIVE TRANSESOPHAGEAL ECHOCARDIOGRAM;  Surgeon: Melrose Nakayama, MD;  Location: [place];  Service: Open Heart Surgery;  Laterality: N/A;  . Coronary artery bypass graft  08/02/2012    Procedure: CORONARY ARTERY  BYPASS GRAFTING (CABG);  Surgeon: Melrose Nakayama, MD;  Location: [place];  Service: Open Heart Surgery;  Laterality: N/A;  . Endovein harvest of greater saphenous vein  08/02/2012    Procedure: ENDOVEIN HARVEST OF GREATER SAPHENOUS VEIN;  Surgeon: Melrose Nakayama, MD;  Location: [place];  Service: Open Heart Surgery;  Laterality: Left;  upper and lower leg   Family History  Problem Relation Age of Onset  . Coronary artery disease Mother   . Heart disease Father   . Heart attack Paternal Grandfather   . Lung cancer Mother   . Diabetes Paternal Grandmother   . Hyperlipidemia Brother   . Hyperlipidemia      father's side  . Melanoma      fh   History  Substance  Use Topics  . Smoking status: Never Smoker   . Smokeless tobacco: Never Used  . Alcohol Use: No    Review of Systems  Constitutional: Negative for fever and chills.  Respiratory: Negative for shortness of breath.   Cardiovascular: Negative for chest pain.  Gastrointestinal: Negative for abdominal pain.  Genitourinary: Positive for flank pain.  Neurological: Negative for headaches.  All other systems reviewed and are negative.  Allergies  Contrast media  Home Medications   Prior to Admission medications   Medication Sig Start Date End Date Taking? Authorizing Provider  finasteride (PROSCAR) 5 MG tablet Take 5 mg by mouth daily.   Yes Historical Provider, MD  aspirin 325 MG tablet Take 325 mg by mouth daily.    Historical Provider, MD  famotidine (PEPCID) 20 MG tablet Take 1 tablet (20 mg total) by mouth 2 (two) times daily. X 4 days 02/09/14   Lutricia Feil, PA  HYDROcodone-acetaminophen (NORCO/VICODIN) 5-325 MG per tablet Take 1 tablet by mouth every 6 (six) hours as needed for pain. 04/30/13   Varney Biles, MD  hydrOXYzine (ATARAX/VISTARIL) 25 MG tablet Take 1 tablet (25 mg total) by mouth 3 (three) times daily as needed for itching. 02/09/14   Audelia Hives Presson, PA  losartan (COZAAR) 25 MG tablet Take 25 mg by mouth daily.    Historical Provider, MD  metoprolol succinate (TOPROL-XL) 25 MG 24 hr tablet Take 25 mg by mouth daily. 07/15/12   Minna Merritts, MD  pioglitazone (ACTOS) 15 MG tablet Take 30 mg by mouth daily.     Historical Provider, MD  predniSONE (DELTASONE) 10 MG tablet Beginning 02/10/2014, take 3 po QD day 1, two po QD day 2 and 1 po QD day 3 then stop 02/09/14   Lutricia Feil, PA  rosuvastatin (CRESTOR) 10 MG tablet Take 1 tablet (10 mg total) by mouth once a week. 01/05/14   Jettie Booze, MD  sitaGLIPtan-metformin (JANUMET) 50-1000 MG per tablet Take 1 tablet by mouth 2 (two) times daily with a meal.     Historical Provider, MD   BP  158/89  Pulse 67  Temp(Src) 97.6 F (36.4 C) (Oral)  Resp 20  Ht 5\' 9"  (1.753 m)  Wt 200 lb (90.719 kg)  BMI 29.52 kg/m2  SpO2 100%  Physical Exam  Nursing note and vitals reviewed. Constitutional: He is oriented to person, place, and time. He appears well-developed and well-nourished. No distress.  HENT:  Head: Normocephalic and atraumatic.  Mouth/Throat: Oropharynx is clear and moist. No oropharyngeal exudate.  Eyes: EOM are normal. Pupils are equal, round, and reactive to light.  Neck: Normal range of motion. Neck supple.  Cardiovascular: Normal  rate and regular rhythm.  Exam reveals no friction rub.   No murmur heard. Pulmonary/Chest: Effort normal and breath sounds normal. No respiratory distress. He has no wheezes. He has no rales.  Abdominal: Soft. He exhibits no distension. There is tenderness (R flank). There is no rebound.  Musculoskeletal: Normal range of motion. He exhibits no edema.  Neurological: He is alert and oriented to person, place, and time.  Skin: Skin is warm and dry. No rash noted. He is not diaphoretic.  Psychiatric: He has a normal mood and affect.    ED Course  Procedures (including critical care time)  DIAGNOSTIC STUDIES: Oxygen Saturation is 100% on RA, normal by my interpretation.    COORDINATION OF CARE: 8:12 PM- Pt advised of plan for treatment and pt agrees.  Labs Review Labs Reviewed  URINALYSIS, ROUTINE W REFLEX MICROSCOPIC - Abnormal; Notable for the following:    Hgb urine dipstick MODERATE (*)    All other components within normal limits  CBC - Abnormal; Notable for the following:    Platelets 131 (*)    All other components within normal limits  BASIC METABOLIC PANEL - Abnormal; Notable for the following:    Glucose, Bld 132 (*)    GFR calc non Af Amer 62 (*)    GFR calc Af Amer 71 (*)    All other components within normal limits  URINE MICROSCOPIC-ADD ON    Imaging Review No results found.   EKG Interpretation None       MDM   Final diagnoses:  Kidney stone    53 year old male here with right flank pain. History of 15 kidney stones, states this feels exact same. In the right flank radiating down his right groin. He cannot get comfortable, having difficulty finding position of comfort here. Associated nausea but no fevers or vomiting. Here vitals are stable. He does have some hematuria noticeable on labs. Feeling better after pain meds. History of so many stones and stating this feels exactly the same and numerous CT scans, we'll hold off on CT scan today. Given Flomax, pain meds, urology followup. Patient ok with this plan without imaging.  I personally performed the services described in this documentation, which was scribed in my presence. The recorded information has been reviewed and is accurate.    Evelina Bucy, MD 03/02/14 2549  Evelina Bucy, MD 03/02/14 [phone]

## 2014-05-04 ENCOUNTER — Telehealth: Payer: Self-pay | Admitting: Interventional Cardiology

## 2014-05-04 MED ORDER — AMOXICILLIN 500 MG PO TABS: ORAL_TABLET | ORAL | Status: DC

## 2014-05-04 NOTE — Telephone Encounter (Signed)
Per  Dr. Hassell Done  Note from 01/05/2014, needs Amoxicillin for dental procedures. When I called the dentist's office back I got their answering machine and left them a message.

## 2014-05-04 NOTE — Telephone Encounter (Signed)
Clearance note faxed to Dr. Maudie Mercury Kiser's office for dental procedure.

## 2014-05-04 NOTE — Telephone Encounter (Signed)
Spoke with patient who is scheduled for a crown next Thursday. Per verbal order Dr. Irish Lack, ok for Amoxicillin 500mg  4 tablets 1 hour prior to dental procedure.

## 2014-05-04 NOTE — Telephone Encounter (Signed)
Follow up     Pt is having a crown next thurs.  He has an artificial valve.  Will he need to be premedicated.  Please fax clearance to Dr Sheran Luz at [phone]

## 2014-05-04 NOTE — Telephone Encounter (Signed)
New message    dentist office calling please advise on pre-med's . Pt sitting in chair now.

## 2014-06-29 ENCOUNTER — Encounter (HOSPITAL_COMMUNITY): Payer: Self-pay | Admitting: Cardiology

## 2014-07-22 IMAGING — CT CT HEART MORP W/ CTA COR W/ SCORE W/ CA W/CM &/OR W/O CM
1 of 22 series · 1 of 22 positions shown, 2 images · IV contrast (CONTRAST)
Comparison: none

Cardiac CT:
INDICATION: R/O dissection
PROTOCOL: The patient was fairly sick with recent MI, ? SBE and
severe AR.  BP was low and he could only be premedicated with 2.5mg
of iv lopressor.  No nitro was given.  Dye allergy prophylaxis was
also given.  Average HR during scan was in the 90;s.  A 120 kV
retrospective scan was done.  Patient received 80 cc of contrast.
He was scanned on a [employer] [REDACTED]ice scanner with gantry rotation
speed 270 msec and collimation .9mm.  The 3D data set was reviewed
using MIP, VRT and MPR modes

[Series 2: locator · axial · 0.35mm/px · z∈[-133,-133]mm · 1 of 1 slices shown, 2 images]
[im 1/1  vessel]
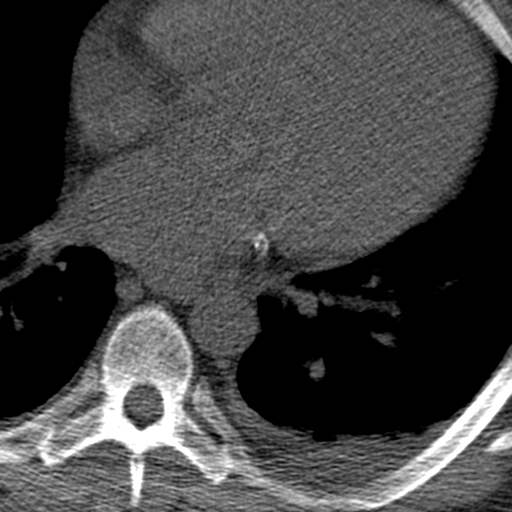
[im 1/1  lung]
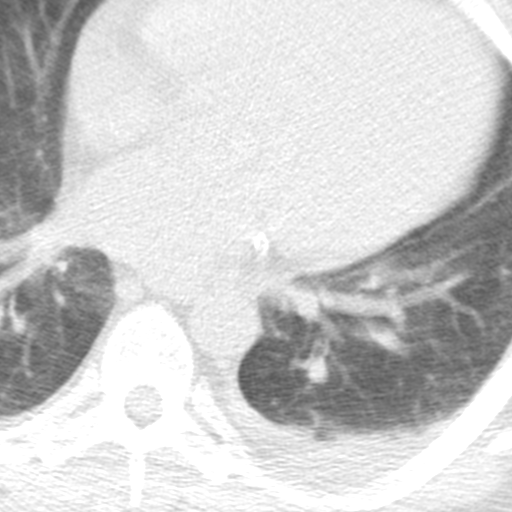

[1 of 22 positions shown; findings below may reference images not displayed]

FINDINGS: There was no evidence of sinus of valsalva aneurysm, or
aortic dissection

Sinus of Valsalva: 3.2 cm
Ascending Aorta: 3.2 cm
Arch: 2.4 cm
Descending thoracic aorta: 2.2 cm

The great vessels arose from the arch normally with no coarctation.

The Aortic valve appeared tri-leaflet.  Although not well seen
there appeared to be a central area of malcoaptation between the
noncoronary cusp and right coronary cusp.  No obvious vegetation
was seen.

The main pulmonary artery was mildly dilated at 3.0 cm

There was no pericardial effusion.

Coronary arteries:  Left dominant with no anomalies

LM- calcified especially distally near take off of circumflex with
no obvious dissection

LAD- patent with non obstructive calcific disease with distal
vessel poorly visualized

      Diagonals:  2 small diagonals visualize without critical
disease

Circumflex- dominant and patent but with large stent in mid vessel
making analysis impossible

      OM1: Patent with stent

RCA: nondominant and small with likely obstructive disease in mid
vessel

See separate report from [REDACTED] for interpretation of
limited soft tissue and lung windows
IMPRESSION: 1)    No aortic dissection Ascending aorta 3.2 cm see other
      measurements above

2)    Trileaflet AV with central malcoaptation and no vegetation
seen on poor quality study
3)    No LM dissection appreciated with dense area of calcification
at circumflex take off
4)    Patent stent to mid left dominant circumflex artery
5)    No pericardial effusion
6)    Mildly dilated main pulmonary artery

## 2014-09-07 ENCOUNTER — Emergency Department (HOSPITAL_COMMUNITY): Payer: BLUE CROSS/BLUE SHIELD

## 2014-09-07 ENCOUNTER — Telehealth: Payer: Self-pay | Admitting: Interventional Cardiology

## 2014-09-07 ENCOUNTER — Emergency Department (HOSPITAL_COMMUNITY)
Admission: EM | Admit: 2014-09-07 | Discharge: 2014-09-07 | Disposition: A | Discharge status: Home / Self Care | Payer: BLUE CROSS/BLUE SHIELD | Attending: Emergency Medicine | Admitting: Emergency Medicine

## 2014-09-07 ENCOUNTER — Other Ambulatory Visit: Payer: Self-pay | Admitting: Physician Assistant

## 2014-09-07 ENCOUNTER — Encounter (HOSPITAL_COMMUNITY): Payer: Self-pay | Admitting: *Deleted

## 2014-09-07 DIAGNOSIS — E785 Hyperlipidemia, unspecified: Secondary | ICD-10-CM | POA: Insufficient documentation

## 2014-09-07 DIAGNOSIS — Z7952 Long term (current) use of systemic steroids: Secondary | ICD-10-CM | POA: Insufficient documentation

## 2014-09-07 DIAGNOSIS — Z7982 Long term (current) use of aspirin: Secondary | ICD-10-CM | POA: Insufficient documentation

## 2014-09-07 DIAGNOSIS — E119 Type 2 diabetes mellitus without complications: Secondary | ICD-10-CM | POA: Insufficient documentation

## 2014-09-07 DIAGNOSIS — Z85820 Personal history of malignant melanoma of skin: Secondary | ICD-10-CM | POA: Insufficient documentation

## 2014-09-07 DIAGNOSIS — Z9889 Other specified postprocedural states: Secondary | ICD-10-CM | POA: Insufficient documentation

## 2014-09-07 DIAGNOSIS — Z79899 Other long term (current) drug therapy: Secondary | ICD-10-CM | POA: Insufficient documentation

## 2014-09-07 DIAGNOSIS — I2510 Atherosclerotic heart disease of native coronary artery without angina pectoris: Secondary | ICD-10-CM | POA: Diagnosis not present

## 2014-09-07 DIAGNOSIS — R079 Chest pain, unspecified: Secondary | ICD-10-CM

## 2014-09-07 DIAGNOSIS — Z8719 Personal history of other diseases of the digestive system: Secondary | ICD-10-CM | POA: Diagnosis not present

## 2014-09-07 DIAGNOSIS — I10 Essential (primary) hypertension: Secondary | ICD-10-CM | POA: Insufficient documentation

## 2014-09-07 DIAGNOSIS — F419 Anxiety disorder, unspecified: Secondary | ICD-10-CM

## 2014-09-07 DIAGNOSIS — Z951 Presence of aortocoronary bypass graft: Secondary | ICD-10-CM | POA: Insufficient documentation

## 2014-09-07 DIAGNOSIS — Z9861 Coronary angioplasty status: Secondary | ICD-10-CM | POA: Diagnosis not present

## 2014-09-07 DIAGNOSIS — Z8589 Personal history of malignant neoplasm of other organs and systems: Secondary | ICD-10-CM | POA: Insufficient documentation

## 2014-09-07 DIAGNOSIS — Z87448 Personal history of other diseases of urinary system: Secondary | ICD-10-CM | POA: Insufficient documentation

## 2014-09-07 DIAGNOSIS — I25708 Atherosclerosis of coronary artery bypass graft(s), unspecified, with other forms of angina pectoris: Secondary | ICD-10-CM

## 2014-09-07 LAB — CBC
HCT: 38.4 % — ABNORMAL LOW (ref 39.0–52.0)
Hemoglobin: 13.1 g/dL (ref 13.0–17.0)
MCH: 30.2 pg (ref 26.0–34.0)
MCHC: 34.1 g/dL (ref 30.0–36.0)
MCV: 88.5 fL (ref 78.0–100.0)
PLATELETS: 106 10*3/uL — AB (ref 150–400)
RBC: 4.34 MIL/uL (ref 4.22–5.81)
RDW: 13.3 % (ref 11.5–15.5)
WBC: 3.6 10*3/uL — AB (ref 4.0–10.5)

## 2014-09-07 LAB — I-STAT TROPONIN, ED
Troponin i, poc: 0 ng/mL (ref 0.00–0.08)
Troponin i, poc: 0 ng/mL (ref 0.00–0.08)

## 2014-09-07 LAB — COMPREHENSIVE METABOLIC PANEL
ALT: 22 U/L (ref 0–53)
AST: 22 U/L (ref 0–37)
Albumin: 4.1 g/dL (ref 3.5–5.2)
Alkaline Phosphatase: 60 U/L (ref 39–117)
Anion gap: 5 (ref 5–15)
BUN: 11 mg/dL (ref 6–23)
CHLORIDE: 105 mmol/L (ref 96–112)
CO2: 30 mmol/L (ref 19–32)
Calcium: 9.7 mg/dL (ref 8.4–10.5)
Creatinine, Ser: 1.01 mg/dL (ref 0.50–1.35)
GFR calc Af Amer: >90 mL/min (ref >90)
GFR, EST NON AFRICAN AMERICAN: 84 mL/min — AB (ref >90)
Glucose, Bld: 150 mg/dL — ABNORMAL HIGH (ref 70–99)
Potassium: 4.8 mmol/L (ref 3.5–5.1)
SODIUM: 140 mmol/L (ref 135–145)
Total Bilirubin: 0.9 mg/dL (ref 0.3–1.2)
Total Protein: 7 g/dL (ref 6.0–8.3)

## 2014-09-07 MED ORDER — ASPIRIN 81 MG PO CHEW
324.0000 mg | CHEWABLE_TABLET | Freq: Once | ORAL | Status: DC
  Filled 2014-09-07: qty 4

## 2014-09-07 MED ORDER — NITROGLYCERIN 0.4 MG SL SUBL
0.4000 mg | SUBLINGUAL_TABLET | SUBLINGUAL | Status: DC | PRN
  Administered 2014-09-07: 0.4 mg via SUBLINGUAL
  Filled 2014-09-07: qty 1

## 2014-09-07 MED ORDER — MORPHINE SULFATE 4 MG/ML IJ SOLN
4.0000 mg | Freq: Once | INTRAMUSCULAR | Status: DC
  Filled 2014-09-07: qty 1

## 2014-09-07 MED ORDER — ACETAMINOPHEN 325 MG PO TABS
650.0000 mg | ORAL_TABLET | Freq: Once | ORAL | Status: AC
  Administered 2014-09-07: 650 mg via ORAL
  Filled 2014-09-07: qty 2

## 2014-09-07 NOTE — ED Notes (Signed)
Pt undressed, in gown, on monitor, continuous pulse oximetry and blood pressure cuff; visitor at bedside 

## 2014-09-07 NOTE — ED Notes (Signed)
Significant other out to desk stating patient pain has returned. Pt. Reports pain is right sided/midsternal. Pt. Reports pain as a squeezing, 4/10. States pain is the same a previous.

## 2014-09-07 NOTE — ED Provider Notes (Signed)
CSN: [id number]     Arrival date & time 09/07/14  1029 History   First MD Initiated Contact with Patient 09/07/14 1040     Chief Complaint  Patient presents with  . Chest Pain     (Consider location/radiation/quality/duration/timing/severity/associated sxs/prior Treatment) HPI Comments: Patient with a history of CAD s/p stents and CABG presents today with chest pain.  He reports acute onset of pain at 8 AM today while driving his car.  Pain is substernal and did not radiate.  He took 324 mg ASA this morning.  He has not taken anything else for the pain.  He reports that the pain was constant until arrival in the ED.  He denies chest pain at this time.  He reports some associated diaphoresis.  Denies nausea, vomiting, abdominal pain, SOB, cough, hemoptysis, LE edema, numbness, tingling, dizziness, or syncope.  He reports that the pain feels similar to the pain that he had with a mild MI in the past.  He does report that he works out on a treadmill and eliptical a couple of times a week and does not get chest pain when working out.  He denies history of PE or DVT.  Denies LE edema.  Denies prolonged travel or surgeries in the past 4 weeks.    He had an MI in 6/13 with a stent placed, with jaw pain and chest pain. In 12/13, he had knee surgery and 2 days later, he had an MI. He had a stent placed at that time. 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  Cardiologist is Dr. Irish Lack.  The history is provided by the patient.    Past Medical History  Diagnosis Date  . Secondary and unspecified malignant neoplasm of lymph nodes, site unspecified   . Diabetes mellitus     type II  . Hyperlipidemia   . Hypertension   . Melanoma   . CAD (coronary artery disease)     a. 12/2011 NSTEMI/PCI: Stenting of the OM;  b. 06/2012 NSTEMI/PCI: LCX 100%->BMS;  c. 07/2012 Cath: dissectied LM & LCX with patent LCX/OM stents->CABG x 3: LIMA->LAD, VG->OM1->OM2.  . Shortness of  breath   . Aortic insufficiency     a. 07/2012 s/p AVR: #25 Magnaease Bioprosthetic Valve (performed @ time of CABG).  . Diverticulitis   . Renal disorder    Past Surgical History  Procedure Laterality Date  . Rotator cuff repair      RIGHT  . Knee arthoscopy      LEFT KNEE  . Ulnar tunnel release      RIGHT  . Keratotomies      BILATERAL  . Septoplasty    . Tonsillectomy    . Knee arthroscopy  06/28/2012    Procedure: ARTHROSCOPY KNEE;  Surgeon: Vickey Huger, MD;  Location: [place];  Service: Orthopedics;  Laterality: Left;  . Cardiac catheterization  12-22-11    with OM1 Promus stent 12/22/11  . Knee surgery      left  . Eye surgery    . Tee without cardioversion  07/28/2012    Procedure: TRANSESOPHAGEAL ECHOCARDIOGRAM (TEE);  Surgeon: Thayer Headings, MD;  Location: [place];  Service: Cardiovascular;  Laterality: N/A;  . Aortic valve replacement  08/02/2012    Procedure: AORTIC VALVE REPLACEMENT (AVR);  Surgeon: Melrose Nakayama, MD;  Location: [place];  Service: Open Heart Surgery;  Laterality: N/A;  . Intraoperative transesophageal echocardiogram  08/02/2012  Procedure: INTRAOPERATIVE TRANSESOPHAGEAL ECHOCARDIOGRAM;  Surgeon: Melrose Nakayama, MD;  Location: [place];  Service: Open Heart Surgery;  Laterality: N/A;  . Coronary artery bypass graft  08/02/2012    Procedure: CORONARY ARTERY BYPASS GRAFTING (CABG);  Surgeon: Melrose Nakayama, MD;  Location: [place];  Service: Open Heart Surgery;  Laterality: N/A;  . Endovein harvest of greater saphenous vein  08/02/2012    Procedure: ENDOVEIN HARVEST OF GREATER SAPHENOUS VEIN;  Surgeon: Melrose Nakayama, MD;  Location: [place];  Service: Open Heart Surgery;  Laterality: Left;  upper and lower leg  . Coronary angiogram  07/30/2012    Procedure: CORONARY ANGIOGRAM;  Surgeon: Minus Breeding, MD;  Location: [facility] CATH LAB;  Service: Cardiovascular;;   Family History  Problem Relation Age of Onset  . Coronary artery disease Mother   .  Heart disease Father   . Heart attack Paternal Grandfather   . Lung cancer Mother   . Diabetes Paternal Grandmother   . Hyperlipidemia Brother   . Hyperlipidemia      father's side  . Melanoma      fh   History  Substance Use Topics  . Smoking status: Never Smoker   . Smokeless tobacco: Never Used  . Alcohol Use: No    Review of Systems  All other systems reviewed and are negative.     Allergies  Contrast media  Home Medications   Prior to Admission medications   Medication Sig Start Date End Date Taking? Authorizing Provider  amoxicillin (AMOXIL) 500 MG tablet Take 4 tablets 1 hour prior to dental appointment 05/04/14   Jettie Booze, MD  aspirin 325 MG tablet Take 325 mg by mouth daily.    Historical Provider, MD  famotidine (PEPCID) 20 MG tablet Take 1 tablet (20 mg total) by mouth 2 (two) times daily. X 4 days 02/09/14   Audelia Hives Presson, PA  finasteride (PROSCAR) 5 MG tablet Take 5 mg by mouth daily.    Historical Provider, MD  HYDROcodone-acetaminophen (NORCO/VICODIN) 5-325 MG per tablet Take 1 tablet by mouth every 6 (six) hours as needed for pain. 04/30/13   Varney Biles, MD  hydrOXYzine (ATARAX/VISTARIL) 25 MG tablet Take 1 tablet (25 mg total) by mouth 3 (three) times daily as needed for itching. 02/09/14   Audelia Hives Presson, PA  losartan (COZAAR) 25 MG tablet Take 25 mg by mouth daily.    Historical Provider, MD  metoprolol succinate (TOPROL-XL) 25 MG 24 hr tablet Take 25 mg by mouth daily. 07/15/12   Minna Merritts, MD  oxyCODONE-acetaminophen (PERCOCET) 5-325 MG per tablet Take 1 tablet by mouth every 6 (six) hours as needed for moderate pain. 03/02/14   Evelina Bucy, MD  pioglitazone (ACTOS) 15 MG tablet Take 30 mg by mouth daily.     Historical Provider, MD  predniSONE (DELTASONE) 10 MG tablet Beginning 02/10/2014, take 3 po QD day 1, two po QD day 2 and 1 po QD day 3 then stop 02/09/14   Lutricia Feil, PA  rosuvastatin (CRESTOR) 10 MG  tablet Take 1 tablet (10 mg total) by mouth once a week. 01/05/14   Jettie Booze, MD  sitaGLIPtan-metformin (JANUMET) 50-1000 MG per tablet Take 1 tablet by mouth 2 (two) times daily with a meal.     Historical Provider, MD  tamsulosin (FLOMAX) 0.4 MG CAPS capsule Take 1 capsule (0.4 mg total) by mouth daily. 03/02/14   Evelina Bucy, MD   BP 125/79 mmHg  Pulse 68  Temp(Src) 98 F (36.7 C) (Oral)  Resp 17  Ht 5\' 9"  (1.753 m)  Wt 210 lb (95.255 kg)  BMI 31.00 kg/m2  SpO2 98% Physical Exam  Constitutional: He appears well-developed and well-nourished. No distress.  HENT:  Head: Normocephalic and atraumatic.  Mouth/Throat: Oropharynx is clear and moist.  Neck: Normal range of motion. Neck supple.  Cardiovascular: Normal rate, regular rhythm, normal heart sounds and intact distal pulses.   Pulses:      Dorsalis pedis pulses are 2+ on the right side, and 2+ on the left side.  Pulmonary/Chest: Effort normal and breath sounds normal.  Abdominal: Soft. Bowel sounds are normal.  Musculoskeletal: Normal range of motion.  No LE edema bilaterally  Neurological: He is alert.  Skin: Skin is warm and dry. He is not diaphoretic.  Psychiatric: He has a normal mood and affect.  Nursing note and vitals reviewed.   ED Course  Procedures (including critical care time) Labs Review Labs Reviewed  CBC  COMPREHENSIVE METABOLIC PANEL  Randolm Idol, ED    Imaging Review Dg Chest 2 View  09/07/2014   CLINICAL DATA:  One day history of mid chest pain  EXAM: CHEST  2 VIEW  COMPARISON:  March 10, 2013  FINDINGS: Lungs are clear. Heart size and pulmonary vascularity are normal. No adenopathy. Patient is status post coronary artery bypass grafting and aortic valve replacement. No pneumothorax. No bone lesions.  IMPRESSION: Postoperative change.  No edema or consolidation.   Electronically Signed   By: Lowella Grip III M.D.   On: 09/07/2014 13:19     EKG Interpretation   Date/Time:   Thursday September 07 2014 10:32:14 EST Ventricular Rate:  66 PR Interval:  144 QRS Duration: 84 QT Interval:  388 QTC Calculation: 406 R Axis:   -15 Text Interpretation:  Normal sinus rhythm Inferior infarct , age  undetermined Cannot rule out Anterior infarct , age undetermined Confirmed  by Alvino Chapel  MD, NATHAN [phone]) on 09/07/2014 12:43:58 PM     1:41 PM Patient discussed with Wannetta Sender with Cardiology.   3:38 PM Discussed with Cardiology.  They offered to admit the patient to the hospital.  However, patient declined admission.  Patient made aware of the risks of being discharged including death.  He verbalizes understanding of the risks.  Patient given strict return precautions.  He denies chest pain at this time. MDM   Final diagnoses:  None   Patient with a history of CABG and stents presents today with a chief complaint of chest pain.  No ischemic changes on EKG.  Initial and 3 hour troponin is negative.  CXR is negative.  Due to the patient's extensive Cardiac history Cardiology was consulted.  Admission was advised, but patient declined admission.  Patient informed of the risks associated with leaving against medical advice.  He demonstrates understanding.  Cardiology arranged for outpatient stress test.    Hyman Bible, PA-C 09/08/14 Justin Alvino Chapel, MD 09/12/14 [phone]

## 2014-09-07 NOTE — ED Notes (Signed)
Pt actually took 325mg  Aspirin this morning.  Laisure, PA verbally ordered for ED dose of Aspirin to be d/c'd.  No need for the Aspirin.

## 2014-09-07 NOTE — Telephone Encounter (Signed)
New Msg      Pt is having some discomfort on right side of his chest.   Pt doesn't have any other symptoms.   Please return call.

## 2014-09-07 NOTE — ED Notes (Addendum)
Pt with extensive cardiac hx to ED for sternal chest pain.  States it "feels like the milder MI I had before".  States pain is getting worse and he is dizzy.  Initially was headed to Dr Emily Filbert office with [facility], but pain became too great.  Took 1 81 mg asa this morning.

## 2014-09-07 NOTE — Telephone Encounter (Signed)
I called and spoke with the patient. He reports that this morning he developed mid-sternal to right sided chest discomfort. This started a couple of hours ago. He feels this is coming and going about every 5-10 seconds. These feel worse at rest. He states he has been up walking today so he is already hot, but he denies any other symptoms at present. He feels that he has been under a lot of stress with his son. He has a history of MI x 2. He describes one as severe and the other as mild. Symptoms today feel somewhat similar to his previous MI. He does not have NTG at home. He is s/p AVR. I hung up with the patient to review symptoms with the PA. When I called him back, he said he had already left work as he felt like his symptoms were starting to get worse. He stated he was on the way to the [facility] ER. I advised this is a wise decision. I called and spoke with the triage nurse in the ER and advised her of the patient coming by private vehicle. He was already on [facility].

## 2014-09-07 NOTE — Consult Note (Signed)
Patient ID: Roy [name] MRN: [id number], DOB/AGE: [birth year]   Admit date: 09/07/2014   Primary Physician: Vidal Schwalbe, MD Primary Cardiologist: Dr. Irish Lack  Pt. Profile:  54 year old male with past medical history of HTN, HLD, DM, CAD s/p 3v CABG (LIMA to LAD, SVG to OM1 to OM2) 07/2012, severe aortic insufficiency s/p bioprosthetic valve during CABG present CP  Problem List  Past Medical History  Diagnosis Date  . Secondary and unspecified malignant neoplasm of lymph nodes, site unspecified   . Diabetes mellitus     type II  . Hyperlipidemia   . Hypertension   . Melanoma   . CAD (coronary artery disease)     a. 12/2011 NSTEMI/PCI: Stenting of the OM;  b. 06/2012 NSTEMI/PCI: LCX 100%->BMS;  c. 07/2012 Cath: dissectied LM & LCX with patent LCX/OM stents->CABG x 3: LIMA->LAD, VG->OM1->OM2.  . Shortness of breath   . Aortic insufficiency     a. 07/2012 s/p AVR: #25 Magnaease Bioprosthetic Valve (performed @ time of CABG).  . Diverticulitis   . Renal disorder     Past Surgical History  Procedure Laterality Date  . Rotator cuff repair      RIGHT  . Knee arthoscopy      LEFT KNEE  . Ulnar tunnel release      RIGHT  . Keratotomies      BILATERAL  . Septoplasty    . Tonsillectomy    . Knee arthroscopy  06/28/2012    Procedure: ARTHROSCOPY KNEE;  Surgeon: Vickey Huger, MD;  Location: [place];  Service: Orthopedics;  Laterality: Left;  . Cardiac catheterization  12-22-11    with OM1 Promus stent 12/22/11  . Knee surgery      left  . Eye surgery    . Tee without cardioversion  07/28/2012    Procedure: TRANSESOPHAGEAL ECHOCARDIOGRAM (TEE);  Surgeon: Thayer Headings, MD;  Location: [place];  Service: Cardiovascular;  Laterality: N/A;  . Aortic valve replacement  08/02/2012    Procedure: AORTIC VALVE REPLACEMENT (AVR);  Surgeon: Melrose Nakayama, MD;  Location: [place];  Service: Open Heart Surgery;  Laterality: N/A;  . Intraoperative transesophageal echocardiogram   08/02/2012    Procedure: INTRAOPERATIVE TRANSESOPHAGEAL ECHOCARDIOGRAM;  Surgeon: Melrose Nakayama, MD;  Location: [place];  Service: Open Heart Surgery;  Laterality: N/A;  . Coronary artery bypass graft  08/02/2012    Procedure: CORONARY ARTERY BYPASS GRAFTING (CABG);  Surgeon: Melrose Nakayama, MD;  Location: [place];  Service: Open Heart Surgery;  Laterality: N/A;  . Endovein harvest of greater saphenous vein  08/02/2012    Procedure: ENDOVEIN HARVEST OF GREATER SAPHENOUS VEIN;  Surgeon: Melrose Nakayama, MD;  Location: [place];  Service: Open Heart Surgery;  Laterality: Left;  upper and lower leg  . Coronary angiogram  07/30/2012    Procedure: CORONARY ANGIOGRAM;  Surgeon: Minus Breeding, MD;  Location: [facility] CATH LAB;  Service: Cardiovascular;;     Allergies  Allergies  Allergen Reactions  . Contrast Media [Iodinated Diagnostic Agents] Other (See Comments)    Pt'shad streaks up and down his arms    HPI  The patient is a pleasant 54 year old male with past medical history of HTN, HLD, DM, CAD s/p 3v CABG (LIMA to LAD, SVG to OM1 to OM2) 07/2012, severe aortic insufficiency s/p bioprosthetic valve during CABG. He had an initial NSTEMI in June 2013 with stent placed in the OM. He returned in December 2013 with NSTEMI and received a bare-metal stent to  left circumflex artery. He underwent repeat cath in January 2014 which showed possible dissection in distal left main or proximal left circumflex with patent left circumflex/OM stents. Echocardiogram was obtained at that time which showed EF 08-65%, grade 3 diastolic dysfunction, severe aortic regurg, PA peak pressure 43 mmHg. He was evaluated by CT surgery and underwent three-vessel CABG in January 2014 with LIMA to LAD, sequential SVG to OM1 and OM 2 by Dr. Roxan Hockey. He had bioprosthetic AVR during the same surgery. He has been doing well since that time. 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 He has not noticed any recent exertional chest pain, increasing shortness breath with exertion or dizziness with exertion. He denies any recent fever, chill, cough, lower extremity edema, orthopnea or PND. His last follow-up with Dr. Irish Lack was in June 2015 at which time he was doing well. He was started on Crestor for LDL.  He was driving to work around 8:30 AM in the morning of 09/07/2014 when he started having substernal chest "squeeziness". He also endorsed some diaphoresis, however no shortness of breath or dizziness. There was no radiation. He denies worsening chest discomfort with deep inspiration or body rotation, however does endorse some increasing chest discomfort with palpation. He initially contacted [facility] office, however as his chest pain started to worsen, he decided to seek medical attention at [facility]. Upon arrival, his blood pressure was 140/86. O2 saturation 91% on room air. Significant laboratory finding include creatinine 1.01, white blood cell count 3.6, negative troponin 2, platelet 106. Chest x-ray shows postoperative changes without edema or consolidation. EKG shows normal sinus rhythm, poor R wave progression in anterior leads.   Home Medications  Prior to Admission medications   Medication Sig Start Date End Date Taking? Authorizing Provider  amoxicillin (AMOXIL) 500 MG tablet Take 4 tablets 1 hour prior to dental appointment 05/04/14  Yes Jettie Booze, MD  aspirin 325 MG tablet Take 325 mg by mouth daily.   Yes Historical Provider, MD  finasteride (PROSCAR) 5 MG tablet Take 1 mg by mouth daily.    Yes Historical Provider, MD  losartan (COZAAR) 25 MG tablet Take 25 mg by mouth daily.   Yes Historical Provider, MD  metFORMIN (GLUCOPHAGE-XR) 500 MG 24 hr tablet Take 500 mg by mouth 2 (two) times daily.   Yes Historical Provider, MD  metoprolol succinate (TOPROL-XL) 25 MG 24 hr tablet Take 25 mg by mouth daily.  07/15/12  Yes Minna Merritts, MD  nitroGLYCERIN (NITROSTAT) 0.4 MG SL tablet Place 0.4 mg under the tongue every 5 (five) minutes as needed for chest pain.   Yes Historical Provider, MD  pioglitazone (ACTOS) 30 MG tablet Take 30 mg by mouth daily.   Yes Historical Provider, MD  rosuvastatin (CRESTOR) 10 MG tablet Take 1 tablet (10 mg total) by mouth once a week. 01/05/14  Yes Jettie Booze, MD  sitaGLIPtan-metformin (JANUMET) 50-1000 MG per tablet Take 1 tablet by mouth 2 (two) times daily with a meal.    Yes Historical Provider, MD  famotidine (PEPCID) 20 MG tablet Take 1 tablet (20 mg total) by mouth 2 (two) times daily. X 4 days Patient not taking: Reported on 09/07/2014 02/09/14   Lutricia Feil, PA  HYDROcodone-acetaminophen (NORCO/VICODIN) 5-325 MG per tablet Take 1 tablet by mouth every 6 (six) hours as needed for pain. Patient not taking: Reported on  09/07/2014 04/30/13   Varney Biles, MD  hydrOXYzine (ATARAX/VISTARIL) 25 MG tablet Take 1 tablet (25 mg total) by mouth 3 (three) times daily as needed for itching. Patient not taking: Reported on 09/07/2014 02/09/14   Lutricia Feil, PA  oxyCODONE-acetaminophen (PERCOCET) 5-325 MG per tablet Take 1 tablet by mouth every 6 (six) hours as needed for moderate pain. Patient not taking: Reported on 09/07/2014 03/02/14   Evelina Bucy, MD  pioglitazone (ACTOS) 15 MG tablet Take 30 mg by mouth daily.     Historical Provider, MD  predniSONE (DELTASONE) 10 MG tablet Beginning 02/10/2014, take 3 po QD day 1, two po QD day 2 and 1 po QD day 3 then stop Patient not taking: Reported on 09/07/2014 02/09/14   Lutricia Feil, PA  tamsulosin (FLOMAX) 0.4 MG CAPS capsule Take 1 capsule (0.4 mg total) by mouth daily. Patient not taking: Reported on 09/07/2014 03/02/14   Evelina Bucy, MD    Family History  Family History  Problem Relation Age of Onset  . Coronary artery disease Mother   . Heart disease Father   . Heart attack Paternal  Grandfather   . Lung cancer Mother   . Diabetes Paternal Grandmother   . Hyperlipidemia Brother   . Hyperlipidemia      father's side  . Melanoma      fh    Social History  History   Social History  . Marital Status: Married    Spouse Name: N/A  . Number of Children: N/A  . Years of Education: N/A   Occupational History  . Not on file.   Social History Main Topics  . Smoking status: Never Smoker   . Smokeless tobacco: Never Used  . Alcohol Use: No  . Drug Use: No  . Sexual Activity: Not on file   Other Topics Concern  . Not on file   Social History Narrative     Review of Systems General:  No chills, fever, night sweats or weight changes.  Cardiovascular:  No dyspnea on exertion, edema, orthopnea, palpitations, paroxysmal nocturnal dyspnea. +chest pain Dermatological: No rash, lesions/masses Respiratory: No cough +dyspnea during chest pain Urologic: No hematuria, dysuria Abdominal:   No nausea, vomiting, diarrhea, bright red blood per rectum, melena, or hematemesis Neurologic:  No visual changes, wkns, changes in mental status. All other systems reviewed and are otherwise negative except as noted above.  Physical Exam  Blood pressure 130/94, pulse 65, temperature 98 F (36.7 C), temperature source Oral, resp. rate 15, height 5\' 9"  (1.753 m), weight 210 lb (95.255 kg), SpO2 100 %.  General: Pleasant, NAD Psych: Normal affect. Neuro: Alert and oriented X 3. Moves all extremities spontaneously. HEENT: Normal  Neck: Supple without bruits or JVD. Lungs:  Resp regular and unlabored, CTA. Heart: RRR no s3, s4, or murmurs. Abdomen: Soft, non-tender, non-distended, BS + x 4.  Extremities: No clubbing, cyanosis or edema. DP/PT/Radials 2+ and equal bilaterally.  Labs  Troponin [facility] Test)  Recent Labs  09/07/14 1430  TROPIPOC 0.00   No results for input(s): CKTOTAL, CKMB, TROPONINI in the last 72 hours. Lab Results  Component Value Date   WBC 3.6*  09/07/2014   HGB 13.1 09/07/2014   HCT 38.4* 09/07/2014   MCV 88.5 09/07/2014   PLT 106* 09/07/2014    Recent Labs Lab 09/07/14 1042  NA 140  K 4.8  CL 105  CO2 30  BUN 11  CREATININE 1.01  CALCIUM 9.7  PROT  7.0  BILITOT 0.9  ALKPHOS 60  ALT 22  AST 22  GLUCOSE 150*   Lab Results  Component Value Date   CHOL 157 07/31/2012   HDL 54 07/31/2012   LDLCALC 95 07/31/2012   TRIG 39 07/31/2012   No results found for: DDIMER   Radiology/Studies  Dg Chest 2 View  09/07/2014   CLINICAL DATA:  One day history of mid chest pain  EXAM: CHEST  2 VIEW  COMPARISON:  March 10, 2013  FINDINGS: Lungs are clear. Heart size and pulmonary vascularity are normal. No adenopathy. Patient is status post coronary artery bypass grafting and aortic valve replacement. No pneumothorax. No bone lesions.  IMPRESSION: Postoperative change.  No edema or consolidation.   Electronically Signed   By: Lowella Grip III M.D.   On: 09/07/2014 13:19    ECG  NSR with poor R wave progression in anterior lead  Echocardiogram TEE 07/28/2012  Left ventricle: Systolic function was normal. The estimated ejection fraction was in the range of 60% to 65%. - Aortic valve: There was a 58mm (L) vegetation on the noncoronary cusp. Severe regurgitation. - Mitral valve: Moderate regurgitation. - Left atrium: No evidence of thrombus in the atrial cavity or appendage. No evidence of thrombus in the atrial cavity or appendage. - Tricuspid valve: No evidence of vegetation.    ASSESSMENT AND PLAN  1. Chest pain at rest with some typical and atypical nature  - negative stress test x2, EKG reviewed by Dr.Jeanenne Licea, no significant ischemic changes  - appear have very good functional status prior to that, no excertional quality prior to today  - Dr. Meda Coffee discussed various options include stress test and cath with patient, he does not want to stay.  - Plan to discharge from ED, will arrange outpatient  treadmill myoview, earliest available stress test is Next Monday. Patient has been advised to seek medical attention if CP recur over the weekend. I will make sure he has enough sublingual nitro in case has recurrent CP.  2. CAD s/p 3v CABG (LIMA to LAD, SVG to OM1 to OM2) 07/2012 by Dr. Roxan Hockey  - Cath June 2013 with stent placed in the OM  - Cath December 2013 with NSTEMI and received a bare-metal stent to left circumflex artery  - Cath January 2014 possible dissection in distal left main or proximal left circumflex with patent left circumflex/OM stents. Echo showed severe AR. Underwent 3v CABG and AVR  3. severe aortic insufficiency s/p bioprosthetic valve during CABG  4. HTN 5. HLD 6. DM  Signed, Almyra Deforest, PA-C 09/07/2014, 2:56 PM   The patient was seen, examined and discussed with Almyra Deforest, PA-C and I agree with the above.   54 year old male with known CAD, s/p CABG x 3 in 2014, coming with a chest pain at rest under very stressful situation. NTG helped resolving the pain, currently asymptomatic and insists on going home. Tropopnin negative x 2, ECG unchanged from 2014, we will discharge with adding imdur 30 mg po daily and schedule an exercise nuclear stress test on Monday Sep 11, 2014.   Dorothy Spark 09/07/2014

## 2014-09-07 NOTE — ED Notes (Signed)
Pt. Reports nitro took away chest pain. Reports HA and nausea now but states it is slowing going away. Will recheck

## 2014-09-07 NOTE — Discharge Instructions (Signed)
Return to the Emergency Department immediately if you chest pain returns, you develop shortness of breath, or any other symptom concerning to you.

## 2014-09-11 ENCOUNTER — Ambulatory Visit (HOSPITAL_COMMUNITY): Payer: BLUE CROSS/BLUE SHIELD | Attending: Physician Assistant | Admitting: Radiology

## 2014-09-11 DIAGNOSIS — R079 Chest pain, unspecified: Secondary | ICD-10-CM | POA: Insufficient documentation

## 2014-09-11 DIAGNOSIS — I10 Essential (primary) hypertension: Secondary | ICD-10-CM | POA: Diagnosis not present

## 2014-09-11 DIAGNOSIS — R0602 Shortness of breath: Secondary | ICD-10-CM | POA: Insufficient documentation

## 2014-09-11 DIAGNOSIS — R42 Dizziness and giddiness: Secondary | ICD-10-CM | POA: Insufficient documentation

## 2014-09-11 DIAGNOSIS — E119 Type 2 diabetes mellitus without complications: Secondary | ICD-10-CM | POA: Insufficient documentation

## 2014-09-11 MED ORDER — TECHNETIUM TC 99M SESTAMIBI GENERIC - CARDIOLITE
30.0000 | Freq: Once | INTRAVENOUS | Status: AC | PRN
  Administered 2014-09-11: 30 via INTRAVENOUS

## 2014-09-11 MED ORDER — TECHNETIUM TC 99M SESTAMIBI GENERIC - CARDIOLITE
10.0000 | Freq: Once | INTRAVENOUS | Status: AC | PRN
  Administered 2014-09-11: 10 via INTRAVENOUS

## 2014-09-11 NOTE — Progress Notes (Signed)
  [facility] [facility] [address] [place], [place] [address] [phone]    Cardiology Nuclear Med Study  [name] is a 54 y.o. 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 IV 0.9% NS with Angio Cath:  22g  IV Site: R Hand  IV Started by:  Matilde Haymaker, RN  Chest Size (in):  46 Cup Size: n/a  Height: 5\' 9"  (1.753 m)  Weight:  215 lb (97.523 kg)  BMI:  Body mass index is 31.74 kg/(m^2). Tech Comments:  No Toprol x 24 hrs    Nuclear Med Study 1 or 2 day study: 1 day  Stress Test Type:  Stress  Reading MD: n/a  Order Authorizing Provider:  Rowan Blase  Resting Radionuclide: Technetium 47m Sestamibi  Resting Radionuclide Dose: 11.0 mCi   Stress Radionuclide:  Technetium 28m Sestamibi  Stress Radionuclide Dose: 33.0 mCi           Stress Protocol Rest HR: 66 Stress HR: 153  Rest BP: 128/79 Stress BP: 181/74  Exercise Time (min): 10:30 METS: 12.50   Predicted Max HR: 167 bpm % Max HR: 91.62 bpm Rate Pressure Product: [phone]   Dose of Adenosine (mg):  n/a Dose of Lexiscan: n/a mg  Dose of Atropine (mg): n/a Dose of Dobutamine: n/a mcg/kg/min (at max HR)  Stress Test Technologist: Perrin Maltese, EMT-P  Nuclear Technologist:  Earl Many, CNMT     Rest Procedure:  Myocardial perfusion imaging was performed at rest 45 minutes following the intravenous administration of Technetium 47m Sestamibi. Rest ECG: NSR with non-specific ST-T wave changes  Stress Procedure:  The patient exercised on the treadmill  utilizing the Bruce Protocol for 10:30 minutes. The patient stopped due to fatigue and denied any chest pain.  Technetium 40m Sestamibi was injected at peak exercise and myocardial perfusion imaging was performed after a brief delay. Stress ECG: No significant ST segment change suggestive of ischemia.  QPS Raw Data Images:  Acquisition technically good; normal left ventricular size. Stress Images:  There is decreased uptake in the inferior lateral wall. Rest Images:  There is decreased uptake in the inferior lateral wall. Subtraction (SDS):  No evidence of ischemia. Transient Ischemic Dilatation (Normal <1.22):  0.90 Lung/Heart Ratio (Normal <0.45):  0.35  Quantitative Gated Spect Images QGS EDV:  90 ml QGS ESV:  38 ml  Impression Exercise Capacity:  Good exercise capacity. BP Response:  Normal blood pressure response. Clinical Symptoms:  No chest pain or dyspnea. ECG Impression:  No significant ST segment change suggestive of ischemia. Comparison with Prior Nuclear Study: No images to compare  Overall Impression:  Low risk stress nuclear study with a small, mild intensity, fixed inferior lateral defect consistent with thinning; no ischemia.  LV Ejection Fraction: 58%.  LV Wall Motion:  NL LV Function; NL Wall Motion  Roy [name]  .

## 2014-09-13 ENCOUNTER — Telehealth: Payer: Self-pay | Admitting: *Deleted

## 2014-09-13 DIAGNOSIS — I2510 Atherosclerotic heart disease of native coronary artery without angina pectoris: Secondary | ICD-10-CM

## 2014-09-13 DIAGNOSIS — Z9861 Coronary angioplasty status: Principal | ICD-10-CM

## 2014-09-13 NOTE — Telephone Encounter (Signed)
Called pt and informed him of stress test results. Pt states that he has pain on the right side of his neck at times.  Pt states that these pains cause migraines and headaches that he can feel "behind his eyes". Pt states that this started after his open heart surgery. Pt states that he has never had carotid dopplers. Informed pt that I would forward this information to Dr. Irish Lack for review and advisement. Pt verbalized understanding and was in agreement with this plan.

## 2014-09-14 NOTE — Telephone Encounter (Signed)
OK to order carotid DOpplers

## 2014-09-14 NOTE — Telephone Encounter (Signed)
Spoke with pt informed him of new order for Carotid Doppler. Explained procedure to pt. Informed pt that someone from our [facility] team would be contacting him to schedule doppler. Pt verbalized understanding and was in agreement with this plan.

## 2014-09-18 ENCOUNTER — Other Ambulatory Visit (HOSPITAL_COMMUNITY): Payer: Self-pay | Admitting: Cardiology

## 2014-09-18 DIAGNOSIS — I6523 Occlusion and stenosis of bilateral carotid arteries: Secondary | ICD-10-CM

## 2014-09-26 ENCOUNTER — Telehealth: Payer: Self-pay | Admitting: Interventional Cardiology

## 2014-09-26 NOTE — Telephone Encounter (Signed)
Pt aware FMLA completed and faxed. He will pick up 09/27/14.

## 2014-09-26 NOTE — Telephone Encounter (Signed)
Pt Dropped off FMLA, gave to jennifer/ Dr.Skains for completion. He is asking for this to be done ASAP.

## 2014-09-27 ENCOUNTER — Ambulatory Visit (HOSPITAL_COMMUNITY): Payer: BLUE CROSS/BLUE SHIELD | Attending: Cardiovascular Disease | Admitting: *Deleted

## 2014-09-27 DIAGNOSIS — I6523 Occlusion and stenosis of bilateral carotid arteries: Secondary | ICD-10-CM | POA: Diagnosis not present

## 2014-09-27 NOTE — Progress Notes (Signed)
Carotid Duplex Exam Performed 

## 2014-09-29 ENCOUNTER — Telehealth: Payer: Self-pay | Admitting: *Deleted

## 2014-09-29 DIAGNOSIS — I6523 Occlusion and stenosis of bilateral carotid arteries: Secondary | ICD-10-CM

## 2014-09-29 NOTE — Telephone Encounter (Signed)
-----   Message from Jettie Booze, MD sent at 09/28/2014  8:50 PM EST ----- Moderate left sided disease.  Repeat study in one year. Minimal right side disease

## 2014-09-29 NOTE — Telephone Encounter (Signed)
Informed pt of carotid results and to repeat study in one year. Pt verbalized understanding and was in agreement with this plan.

## 2014-11-07 NOTE — Consult Note (Signed)
General Aspect 54 yo WM with history of DM, HTN, HLD and family history of CAD who is admitted after an episode of severe substernal chest pain yesterday with radiation of pain into his jaw and left arm. Prior stent to the OM vessel 12/2011. Cardiology was consulted for NSTEMI.   He woke with pain yesterday and it lasted all day, even in to teh ER. Center of chest with radiation to the left. He has been having sx for a few weeks with SOB, stuttering "indigestion". He had arthroscopic knee surgery on Monday (three days ago). Symptoms similar in some ways to previous NSTEMI in 12/2011.  In the ER, he was given SL NTG. No associated SOB/nausea/diaphoresis/dizziness.  First and Second troponin is elevated this am. on heparin. He is now chest pain free.  EKG changes noted in inferior leads, T wave inversions conpared to June 2013.    PMH:  1. DM 2. HTN 3. HLD  PSH:  1. Right rotator cuff surgery 2. Left knee surgery 3. Right ulnar release 4. Bilateral karatotomies  Allergies: NKDA    Present Illness . SOCIAL HISTORY: Negative for alcohol or tobacco abuse.   FAMILY HISTORY: Positive for hypertension, diabetes, and coronary artery disease. Negative for colon or prostate cancer.   Physical Exam:   GEN well developed, well nourished, no acute distress    HEENT red conjunctivae    NECK supple    RESP normal resp effort  clear BS    CARD Regular rate and rhythm  No murmur    ABD denies tenderness  soft    LYMPH negative neck    EXTR negative edema    SKIN normal to palpation    NEURO motor/sensory function intact    PSYCH alert, A+O to time, place, person, good insight   Review of Systems:   Subjective/Chief Complaint Chest pain radiating to the left.    General: chest pain resolved    Skin: No Complaints    ENT: No Complaints    Eyes: No Complaints    Neck: No Complaints    Respiratory: No Complaints    Cardiovascular: Chest pain or discomfort     Gastrointestinal: No Complaints    Genitourinary: No Complaints    Vascular: No Complaints    Musculoskeletal: No Complaints    Neurologic: No Complaints    Hematologic: No Complaints    Endocrine: No Complaints    Psychiatric: No Complaints    Review of Systems: All other systems were reviewed and found to be negative    Medications/Allergies Reviewed Medications/Allergies reviewed     Diabetes Mellitus, Type II (NIDD):    MI - Myocardial Infarct:    Angina:    Hyperlipidemia:    Hypertension:        Admit Diagnosis:   ACUTE MI 410.90 SHOB 786.05 429.2: 30-Jun-2012, Active, ACUTE MI 410.90 SHOB 786.05 429.2      Admit Reason:   Diabetes: (250.00) Active, ICD9, DM w/o complication type II   Arteriosclerotic cardiovascular disease (ASCVD): (429.2) Active, ICD9, Unspecified cardiovascular disease   Shortness of breath: (786.05) Active, ICD9, Shortness of breath   AMI (acute myocardial infarction): (410.90) Active, ICD9, Acute myocardial infarction, unspecified site, episode of care unspecified  Home Medications: Medication Instructions Status  clopidogrel 75 mg oral tablet 1 tab(s) orally once a day Active  aspirin 325 mg oral delayed release tablet 1 tab(s) orally once a day Active  benazepril 20 mg oral tablet 1 tab(s) orally once a day  Active  Crestor 20 mg oral tablet 1 tab(s) orally once a day (at bedtime) Active  Janumet XR 50 mg-1000 mg oral tablet, extended release 1 tab(s) orally once a day Active  pioglitazone 15 mg oral tablet 1 tab(s) orally once a day Active  metoprolol succinate 25 mg oral tablet, extended release 1 tab(s) orally once a day Active   Lab Results:  Routine Chem:  12-Dec-13 00:19    Result Comment TROPONIN - RESULTS VERIFIED BY REPEAT TESTING.  - PREVIOUS CALL:07/11/12@1724 .Marland KitchenMarland KitchenTPL  Result(s) reported on 01 Jul 2012 at 01:00AM.   Cholesterol, Serum 105   Triglycerides, Serum 47   HDL (INHOUSE)  35   VLDL Cholesterol Calculated 9    LDL Cholesterol Calculated 61 (Result(s) reported on 01 Jul 2012 at 12:45AM.)   Glucose, Serum  151   BUN 11   Creatinine (comp) 0.96   Sodium, Serum 140   Potassium, Serum 3.9   Chloride, Serum 106   CO2, Serum 26   Calcium (Total), Serum  8.4   Anion Gap 8   Osmolality (calc) 282   eGFR (African American) >60   eGFR (Non-African American) >60 (eGFR values <72m/min/1.73 m2 may be an indication of chronic kidney disease (CKD). Calculated eGFR is useful in patients with stable renal function. The eGFR calculation will not be reliable in acutely ill patients when serum creatinine is changing rapidly. It is not useful in  patients on dialysis. The eGFR calculation may not be applicable to patients at the low and high extremes of body sizes, pregnant women, and vegetarians.)  Cardiac:  12-Dec-13 00:19    CK, Total  430   CPK-MB, Serum  22.3 (Result(s) reported on 01 Jul 2012 at 12:58AM.)   Troponin I  14.00 (0.00-0.05 0.05 ng/mL or less: NEGATIVE  Repeat testing in 3-6 hrs  if clinically indicated. >0.05 ng/mL: POTENTIAL  MYOCARDIAL INJURY. Repeat  testing in 3-6 hrs if  clinically indicated. NOTE: An increase or decrease  of 30% or more on serial  testing suggests a  clinically important change)  Routine Hem:  12-Dec-13 00:19    WBC (CBC) 6.4   RBC (CBC)  3.93   Hemoglobin (CBC)  11.8   Hematocrit (CBC)  34.3   Platelet Count (CBC)  120   MCV 87   MCH 30.1   MCHC 34.5   RDW 13.2   Neutrophil % 59.2   Lymphocyte % 25.6   Monocyte % 13.1   Eosinophil % 1.7   Basophil % 0.4   Neutrophil # 3.8   Lymphocyte # 1.6   Monocyte # 0.8   Eosinophil # 0.1   Basophil # 0.0 (Result(s) reported on 01 Jul 2012 at 12:38AM.)   EKG:   Interpretation EKG shows NSR with T wave abn in III, AVF    Iodine: Rash  Contrast dye: Unknown  Vital Signs/Nurse's Notes: **Vital Signs.:   12-Dec-13 03:38   Vital Signs Type Routine   Temperature Temperature (F) 97.5   Celsius 36.3    Temperature Source Oral   Pulse Pulse 76   Respirations Respirations 18   Systolic BP Systolic BP 98   Diastolic BP (mmHg) Diastolic BP (mmHg) 60   Mean BP 72   Pulse Ox % Pulse Ox % 94   Pulse Ox Activity Level  At rest   Oxygen Delivery Room Air/ 21 %     Impression 54yo WM with history of DM, HTN, HLD and family history of CAD who is admitted  after an episode of severe substernal chest pain yesterday with radiation of pain into his jaw and left arm. Prior stent to the OM vessel 12/2011.   1. NSTEMI:  Pt has multiple risk factors for CAD including DM, HTN, HLD and a strong family history of CAD.  Prior PCI of the OM, residual LCX disease 60% Troponin, CK and CK MB  are elevated.  Currently pain free, T wave ABN III, AVF (new) Continue asa 325 daily, heparin.  plan cardiac cath with possible PCI this AM.  I have discussed this procedure at length with the patient and his wife including all risks and benefits. He agrees to proceed. Discussed previous CT contrast load and vein irritation in arms. Does not seem to be an allergy. Will not premedicate. His diabetes was poorly controlled after previous prednisone dosing on last cath.   2) Diabetes: Would hold diabetes meds this AM, as NPO cover with sliding scale  3)HTN: NTG paste held for headache Would continue metoprolol, hold ACE for now  4)Itching he feels it is from plavix. May need to use brilinta with low dose aspirin or effient after cath.   Electronic Signatures: Ida Rogue (MD)  (Signed 12-Dec-13 08:49)  Authored: General Aspect/Present Illness, History and Physical Exam, Review of System, Past Medical History, Health Issues, Home Medications, Labs, EKG , Allergies, Vital Signs/Nurse's Notes, Impression/Plan   Last Updated: 12-Dec-13 08:49 by Ida Rogue (MD)

## 2014-11-07 NOTE — H&P (Signed)
PATIENT NAME:  Roy [name], Roy [name] MR#:  [id number] DATE OF BIRTH:  1961-05-30  DATE OF ADMISSION:  06/30/2012  REFERRING PHYSICIAN: Dr. Cinda Quest FAMILY PHYSICIAN: Dr. Harlan Stains  REASON FOR ADMISSION: Non-ST elevation myocardial infarction.   HISTORY OF PRESENT ILLNESS: The patient is a 54 year old male with a history of diabetes, hypertension, and hyperlipidemia. He was admitted to this facility in June 2013 with a non-ST elevation myocardial infarction. Underwent cardiac catheterization at that time and had angioplasty with stent placement in the first obtuse marginal. Has been followed by Dr. Rockey Situ as an outpatient. Presents to the Emergency Room tonight with worsening chest pain notable over the past 2 to 3 days. The pain is substernal chest discomfort radiating to the left arm, rated as 8/10. Has also been having some cold sensation and shortness of breath. Some palpitations. No nausea. Patient was brought to the Emergency Room where his pain improved with nitrates. His EKG showed no acute changes but troponin returned positive at 4.97 consistent with a recurrent non-ST elevation myocardial infarction. He is now admitted for further evaluation.   PAST MEDICAL HISTORY:  1. Atherosclerotic cardiovascular disease, status post myocardial infarction in June 2013.  2. Status post percutaneous transluminal coronary angioplasty with stent placement of the first obtuse marginal.  3. Type 2 diabetes mellitus. 4. Benign hypertension.  5. Hyperlipidemia.  6. Status post recent left knee surgery.  7. Status post right rotator cuff surgery.   MEDICATIONS:  1. Aspirin 325 mg p.o. daily.  2. Actos 15 mg p.o. daily.  3. Toprol-XL 25 mg p.o. daily.  4. Januvia 50 mg p.o. daily.  5. Metformin ER 1000 mg p.o. daily.  6. Crestor 20 mg p.o. daily.  7. Plavix 75 mg p.o. daily.  8. Lotensin 20 mg p.o. daily.   ALLERGIES: Iodine.   SOCIAL HISTORY: Negative for alcohol or tobacco abuse.   FAMILY HISTORY:  Positive for hypertension, diabetes, and coronary artery disease. Negative for colon or prostate cancer.   REVIEW OF SYSTEMS: CONSTITUTIONAL: No fever or change in weight. EYES: No blurred or double vision. No glaucoma. ENT: No tinnitus or hearing loss. No nasal discharge or bleeding. No difficulty swallowing. RESPIRATORY: No cough or wheezing. Denies hemoptysis. No painful respiration. CARDIOVASCULAR: No orthopnea. No syncope. GASTROINTESTINAL: No nausea, vomiting or diarrhea. No abdominal pain. No change in bowel habits. GENITOURINARY: No dysuria, hematuria. No incontinence. ENDOCRINE: No polyuria or polydipsia. No heat or cold intolerance. HEMATOLOGIC: Patient denies anemia, easy, or bleeding. LYMPHATIC: Swollen glands.  MUSCULOSKELETAL: Patient denies pain his neck, back, shoulders, knees, or hips. No gout. NEUROLOGICAL: No numbness or weakness. Denies migraines, stroke or seizures. PSYCH: Patient denies anxiety, insomnia or depression.   PHYSICAL EXAMINATION:  GENERAL: Patient is in no acute distress.  VITAL SIGNS: Vital signs are currently remarkable for blood pressure 120/81 with a heart rate of 74 and a respiratory rate of 18. He is afebrile.  HEENT: Normocephalic, atraumatic. Pupils equally round and reactive to light and accommodation. Extraocular movements are intact. Sclera not icteric. Conjunctiva are clear. Oropharynx is clear.  NECK: Supple without JVD. No adenopathy or thyromegaly is noted.  LUNGS: Clear to auscultation and percussion without wheezes, rales, or rhonchi. No dullness.  CARDIAC: Regular rate and rhythm with normal S1, S2. No significant rubs, murmurs, or gallops. PMI is not displaced. Chest wall is nontender.   ABDOMEN: Soft and nontender with normoactive bowel sounds. No organomegaly or masses were appreciated. No hernias or bruits were noted.   EXTREMITIES: Without  clubbing, cyanosis, edema. Pulses were 2+ bilaterally.  SKIN: Warm and dry without rash or  lesions.  NEUROLOGICAL: Cranial nerves II through XII grossly intact. Deep tendon reflexes were symmetric. Motor and sensory examination is nonfocal.   PSYCH: Patient was alert and oriented to person, place, and time. He was cooperative and used good judgment.   LABORATORY, DIAGNOSTIC AND RADIOLOGICAL DATA: EKG revealed sinus rhythm at 81 beats per minute with evidence of an old inferior infarct. No acute ischemic changes were noted. His white count was 5.6 with a hemoglobin of 13.5. Glucose was 263 with a BUN of 10, a creatinine of 0.91 with a GFR of greater than 60 with a sodium of 139 and a potassium of 4.1. Total CK was 313 with an MB of 15.2 and a troponin of 4.97.   ASSESSMENT:  1. Recurrent non-ST elevation myocardial infarction.  2. Atherosclerotic cardiovascular disease status post previous myocardial infarction. 3. Status post previous percutaneous transluminal coronary angioplasty with stent placement in the first obtuse marginal.  4. Benign hypertension.  5. Type 2 diabetes.  6. Hyperlipidemia.  7. Recent left knee surgery.   PLAN: Patient will be started on heparin and topical nitrates with beta blocker therapy. He will be admitted to telemetry. Will follow serial cardiac enzymes and obtain an echocardiogram. Will follow his sugars with Accu-Cheks before meals and at bedtime and add sliding scale insulin as needed. Will supplement oxygen at this time and wean as tolerated. Follow up chest x-ray in the morning. Will consult cardiology for cardiac catheterization. Further treatment and evaluation will depend upon the patient's progress.     TOTAL TIME SPENT ON THIS PATIENT: 50 minutes.   ____________________________ Leonie Douglas Doy Hutching, MD jds:cms D: 06/30/2012 19:07:50 ET T: 07/01/2012 05:43:39 ET JOB#: [id number]  cc: Leonie Douglas. Doy Hutching, MD, <Dictator> Dr. Harlan Stains  Roy [name] D Roy Lanzo MD ELECTRONICALLY SIGNED 07/01/2012 6:50

## 2014-11-10 NOTE — Discharge Summary (Signed)
PATIENT NAME:  Roy [name], Roy [name] MR#:  [id number] DATE OF BIRTH:  07-05-1961  DATE OF ADMISSION:  06/30/2012 DATE OF DISCHARGE:  07/03/2012  PRIMARY CARE PHYSICIAN: Harlan Stains, MD  CARDIOLOGIST: Minna Merritts, MD  FINAL DIAGNOSIS: Non-ST elevation myocardial infarction, status post stent, acute myocardial infarction.   HISTORY OF PRESENT ILLNESS: This is a 54 year old male with history of diabetes, hypertension and hyperlipidemia, was admitted in June 2013 with non-ST elevation MI. Underwent cardiac catheterization at that time and had angioplasty with stent placement in first obtuse marginal. Has been followed by Dr. Rockey Situ as outpatient. Presented to Emergency Room with worsening chest pain notable over the past 2 to 3 days. Pain was radiating to the left arm and rated 8 out of 10. Was also having some cold sensation, shortness of breath, palpitations, but no nausea. His pain improved in Emergency Room with nitrates. EKG showed no acute changes, but troponin was 4.97, so he was admitted for further evaluation in Critical Care Unit.  HOSPITAL COURSE AND STAY: He was admitted to Critical Care Unit for acute myocardial infarction with IV heparin drip, was continued on beta blocker and nitrates for pain and monitored under telemetry with serial troponin, also supplemental oxygen. The next morning, cardiologist saw him and decided to do cardiac catheterization. They found some blockages and put stent. During the procedure, he became hypotensive and had oxygen saturation dropping so he was monitored 1 more day in Critical Care Unit.   For his acute congestive heart failure, given 2 doses of Lasix, but then his renal function was worsening so was held and was advised to drink more water. He improved greatly over the next 2 days and then from cardiac point of view, he was discharged on all the cardiac medications as I will mention later on.   OTHER MEDICAL ISSUES ADDRESSED DURING THIS HOSPITAL STAY:   1.  For his coronary artery disease, we continued him on beta blocker. ACE inhibitor was not started because of mild renal impairment and blood pressure running on the lower normal side.  2.  Diastolic CHF, acute, secondary to myocardial infarction, delayed relaxation and wall motion on echocardiogram and congestion on x-rays, which responded to Lasix. He was initially needing oxygen, but slowly over 2 days tapered off the oxygen and then he was able to tolerate exertion with good oxygen saturation on room air, so was discharged with advice to take Lasix only if needed when he notices he is more short of breath and he increases his body weight.  3.  Diabetes mellitus. Initially he was on sliding scale insulin, but after the procedure, he was started on Januvia and Actos and metformin as his renal function improved and he started eating diet.  4.  On the second day of hospitalization and post cardiac catheterization, he had 1 episode of fever and he had dry cough constantly, not improving even after improving his CHF. On chest x-ray, there were findings suggestive of suspicious pneumonia, so we started him on antibiotics. Blood cultures were done, which remained negative, and he had some improvement with the therapy and so discharged with oral antibiotics to be taken at home after discharge.  5.  Hypertension. Low to normal blood pressure, so he was continued on beta blocker therapy.   IMPORTANT LABORATORY RESULTS: Cardiac catheterization summary: Occluded mid left circumflex disease with collaterals to distal LCx and OM, distal LAD collaterals to distal LCx. A stent with thrombectomy was performed on 100% lesion in mid  circumflex. Following intervention, there was an excellent angiographic appearance with 0% residual stenosis. A second overlapping stent was used due to residual thrombus at __________. Before starting the PCI, the patient was noted to be hypotensive with systolic blood pressure of 75 and was  given IV bolus, but respiratory distress and hypoxia throughout the case and was placed on nonrebreather mask. PCI was difficult due to coughing, was given Lasix, and so was advised to monitor in ICU. Chest x-ray and diuretics after the procedure. Small to moderate size, nondominant RCA. Moderate hypokinesis of inferior wall. Ejection fraction 55%.   Echocardiogram: Ejection fraction more than 55%. Concern of mild inferior, posterior and lateral wall hypokinesis. Right ventricular systolic function is normal. Left atrium mildly dilated. Right ventricular systolic pressure is normal. Impaired LV relaxation.   His renal function was normal on arrival, but after the cardiac cath it worsened with creatinine up to 1.46, but then later on it improved to 1.30. Troponin on arrival was 4.97, which went up to 14 and then came down to 9 after the procedure. Blood culture was done as he had fever, which was no growth after 48 hours.   CONDITION ON DISCHARGE: Stable.   CODE STATUS ON DISCHARGE: Full code.   MEDICATIONS ADVISED ON DISCHARGE: Crestor 20 mg oral tablet once a day, Janumet 50 mg/1000 mg oral tablet extended-release once a day, pioglitazone 15 mg oral tablet once a day, aspirin 81 mg once a day, metoprolol 25 mg 3 times a day, Prasugrel 10 mg oral tablet once a day, levofloxacin 500 mg oral tablet once a day, dextromethorphan/guaifenesin 10 mg/100 mg oral liquid every 4 hours as needed, Lasix 20 mg oral tablet.   Benazepril is held, as heart rate is remaining higher and so he needs increased dose of metoprolol, which will lower down the blood pressure and not allow his medication. It may be restarted by Dr. Rockey Situ in future appointment.   DIET ON DISCHARGE: Low sodium, low fat, low cholesterol, carbohydrate-controlled diet. Diet consistency: Regular.   ACTIVITY LIMITATION: As tolerated. No strenuous activity for 10 days.   FOLLOWUP: Within 1 week with Dr. Rockey Situ, [facility], and  family physician, Harlan Stains.   TIME SPENT ON DISCHARGE SUMMARY: 45 minutes.    ____________________________ Ceasar Lund Anselm Jungling, MD [id number] D: 07/05/2012 18:05:46 ET T: 07/06/2012 17:21:31 ET JOB#: [id number]  cc: Ceasar Lund. Anselm Jungling, MD, <Dictator> Harlan Stains, MD Minna Merritts, MD Vaughan Basta MD ELECTRONICALLY SIGNED 08/02/2012 23:20

## 2014-11-12 NOTE — H&P (Signed)
PATIENT NAME:  Roy [name], Roy [name] MR#:  [id number] DATE OF BIRTH:  07-Dec-1960  DATE OF ADMISSION:  12/20/2011  REASON FOR ADMISSION: Chest pain.   HISTORY OF PRESENT ILLNESS: Roy [name] is a 54 year old white male with cardiac risk factors including diabetes, hypertension, and hyperlipidemia who presents to the ED with sudden onset of substernal chest pain which became severe in nature, radiating to the left jaw and left shoulder and lasted a total of 35 minutes. It did not resolve until his third sublingual nitroglycerin tablet was given. The patient states that he was at home working on a car when he came inside to have a soft drink and he noted sudden onset of substernal pain that he initially thought was reflux, since he did have some reflux symptoms. However, the pain became crescendo in nature and started radiating to his jaw and left arm and was accompanied by diaphoresis without nausea. Because of the nature of the pain he came to the Emergency Room. He did also report some shortness of breath without cough, weakness, or dizziness. The patient had recently been treated by his primary care physician for an infection on his neck with antibiotics and prednisone and his blood sugars had subsequently been very elevated. He exercises irregularly. He states that he walks maybe once or twice a week and has not had any pain like this during recent exertional episodes. He did have a similar episode that was attributed to anxiety. This was 13 years ago and was evaluated with a stress test, which was apparently normal.   PRIMARY CARE PHYSICIAN: Roy [name] of [facility] in [place].   PAST MEDICAL HISTORY:  1. History of diabetes mellitus, type 2 on oral medications.  2. Hyperlipidemia controlled with Crestor.  3. Hypertension controlled with benazepril.  PAST SURGICAL HISTORY: All outpatient procedures including: 1. Right rotator cuff surgery.  2. Left knee arthroplasty.  3. Right ulnar  release.  4. Bilateral keratotomies.   ALLERGIES: He has no known allergies.   HOSPITALIZATIONS: He has never been hospitalized.   FAMILY HISTORY: Notable for father who has coronary artery disease, but is not sure at what age he developed this. His mother died at age 27 of lung cancer after a remote history of tobacco abuse.   SOCIAL HISTORY: He is married. He is a nonsmoker. He works outside for [employer]. 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 His exercise history is irregular.   REVIEW OF SYSTEMS: Negative for fever, fatigue, weakness, pain, or weight changes. He denies any recent visual changes. He denies ear pain, seasonal rhinitis, and trouble swallowing. He denies any recent cough, wheezing, or hemoptysis. He did note some dyspnea today with onset of chest pain. He denies painful respirations. He has no history of orthopnea, edema, or dyspnea on exertion. He denies palpitations and syncope. He does have a history of hypertension and chest pain per history of present illness. No recent episodes of nausea, vomiting, diarrhea, abdominal pain, or changes in bowel habits. He does have occasional gastroesophageal reflux disease. No history of dysuria or hematuria. He does have a history of nephrolithiasis and has had 13 episodes of kidney stones in the past. This pain is nothing like his kidney stones.  He said this is much worse. He has no history of polyuria, nocturia, or thyroid problems, and no heat or cold intolerance. He denies anemia, easy bruising or bleeding. He denies any chronic neck, back, shoulder, knee, or hip pain. He  denies any numbness, weakness, dysarthria, or history of seizures, tremors, or vertigo. He denies anxiety, insomnia, or depression.   PHYSICAL EXAMINATION:  GENERAL: This is a well nourished, middle-aged male in no apparent distress. Initial blood pressure 150/78 and at time of my exam 104/64, pulse is regular and ranging between 80 and 90. temperature is 97,  respirations 13, sating 99% on room air.   HEENT: Pupils are equal, round, and reactive to light. Extraocular movements are intact. Sclerae are anicteric. Oropharynx benign.   NECK: Supple without lymphadenopathy, JVD, or thyromegaly.   LUNGS: Clear to auscultation bilaterally with no wheezing or rhonchi.   CARDIOVASCULAR: Regular rate and rhythm. No murmurs, rubs, or gallops. Chest wall is nontender. Pedal pulses are palpable and there is no lower extremity edema.   ABDOMEN: Soft, nontender, and nondistended with good bowel sounds and no evidence of hepatosplenomegaly.   MUSCULOSKELETAL: Exam is grossly nonfocal. He is moving all extremities well.   SKIN: Skin is warm and dry without rashes or lesions.   LYMPH: There is no cervical, axillary, inguinal, or supraclavicular lymphadenopathy.   NEUROLOGICAL: Grossly nonfocal. He is alert and oriented to person, place, and time.   ADMISSION LABORATORY DATA: Sodium 139, potassium 4.9, chloride 102, bicarbonate 27, BUN 15, creatinine 1.16, glucose 303, white count 4, hemoglobin 14.3, platelets 123. ProTime is  13.3. Liver function tests are normal. CK is 140, MB is 1.9. Troponin I is less than 0.02. 12-lead EKG shows normal sinus rhythm with normal intervals. No T-wave inversions except in lead V1. Chest CT was done which ruled out PE, but did show evidence of coronary artery disease and kidney stones.   ASSESSMENT AND PLAN:  1. Chest pain, concerning for unstable angina: We will admit for rule out myocardial infarction and stress testing versus cardiac catheterization depending on how his enzymes turn out. He has been started on IV heparin in the Emergency Room. He has been given nitroglycerin. We will start beta blocker and ask [place] cardiology to see him.  2. Uncontrolled diabetes mellitus: Per patient his diabetes has been well controlled but recently has had elevated blood sugars due to use of steroids prescribed by his PCP for some type of  skin infection. We will hold his Actos and metformin and use sliding scale currently.  3. Hyperlipidemia: Continue Crestor.  4. Hypertension: Holding ACE inhibitor for now as we are starting beta blocker for unstable angina.   ESTIMATED TIME OF CARE: 40 minutes.   ____________________________ Deborra Medina, MD tt:bjt D:  12/20/2011 20:11:48 ET         T: 12/21/2011 09:18:04 ET         JOB#: [id number]  cc: [facility]- Roy [name] Deborra Medina MD ELECTRONICALLY SIGNED 01/26/2012 13:17

## 2014-11-12 NOTE — Consult Note (Signed)
Present Illness 54 yo WM with history of DM, HTN, HLD and family history of CAD who is admitted after an episode of severe substernal chest pain yesterday with radiation of pain into his jaw and left arm. The pain persisted until he was seen in the ED and given SL NTG. No associated SOB/nausea/diaphoresis/dizziness. He is now chest pain free. No prior cardiac history. EKG without ischemic changes. Second troponin is elevated this am.   PMH:  1. DM 2. HTN 3. HLD  PSH:  1. Right rotator cuff surgery 2. Left knee surgery 3. Right ulnar release 4. Bilateral karatotomies  Allergies: NKDA  Meds: see list.   SH: No tobacco, alcohol or drug use.   FH: Father with CAD  ROS: As stated in HPI and otherwise negative.   Physical Exam:   GEN well developed, well nourished, no acute distress    HEENT moist oral mucosa    NECK supple  No masses  thyroid not tender    RESP normal resp effort  clear BS    CARD Regular rate and rhythm  No murmur    ABD denies tenderness  normal BS    LYMPH negative neck    EXTR negative edema    SKIN normal to palpation    NEURO cranial nerves intact    PSYCH A+O to time, place, person   Review of Systems:   Subjective/Chief Complaint No complaints this am.   Home Medications: Medication Instructions Status  Janumet XR 50 mg-1000 mg oral tablet, extended release 2 tab(s) orally once a day (in the evening) Active  benazepril 10 mg oral tablet 1 tab(s) orally once a day (in the morning) Active  Crestor 10 mg oral tablet 1 tab(s) orally once a day Active  pioglitazone 15 mg oral tablet 1 tab(s) orally once a day. **brand name actos** Active  ibuprofen 200 mg oral tablet 2 tab(s) orally as needed. Active  Visine for Contacts preserved ophthalmic solution 1 to 2 drop(s) to each affected eye as needed. Active   EKG:   Interpretation NSR, rate 66 bpm.   Radiology Results: XRay:    01-Jun-13 16:53, Chest Portable Single View   Chest  Portable Single View    REASON FOR EXAM:    Chest Pain  COMMENTS:       PROCEDURE: DXR - DXR PORTABLE CHEST SINGLE VIEW  - Dec 20 2011  4:53PM     RESULT: The lungs are clear. The cardiovascular structures are   unremarkable.    IMPRESSION:  No acute abnormality.      Thankyou for this opportunity to contribute to the care of your patient.           Verified By: Osa Craver, M.D., MD  CT:    01-Jun-13 18:29, CT Chest for Pulm Embolism With Contrast   CT Chest for Pulm Embolism With Contrast    REASON FOR EXAM:    chest pain  initially better with ntg  then not    better  COMMENTS:       PROCEDURE: CT  - CT CHEST (FOR PE) W  - Dec 20 2011  6:29PM     RESULT: History: Pain.    Comparison: Prior chest x-ray of 12/20/2011.    Findings: Standard CT obtained under cc of Isovue-370. Motion artifact   noted thoracic aorta. No evidence of dilatation. Coronary artery disease   present. Motion artifact noted in the pulmonary artery. No pulmonary  embolus. Large airways are patent. Atelectasis in the lung bases. Stones   noted in kidneys.  IMPRESSION:  Coronary artery disease. No pulmonary embolus. Mild   atelectasis lung bases otherwise no acute abnormality.          Verified By: Osa Craver, M.D., MD    No Known Allergies:   Vital Signs/Nurse's Notes: **Vital Signs.:   02-Jun-13 08:04   Vital Signs Type Routine   Temperature Temperature (F) 97.6   Celsius 36.4   Temperature Source oral   Pulse Pulse 73   Pulse source per Dinamap   Respirations Respirations 18   Diastolic BP (mmHg) Diastolic BP (mmHg) 72   BP Source vital sign device   Pulse Ox % Pulse Ox % 98   Oxygen Delivery Room Air/ 21 %     Impression 1. NSTEMI: Pt has multiple risk factors for CAD including DM, HTN, HLD and a strong family history of CAD. Troponin is elevated. Currently pain free without ischemic EKG changes. Will make NPO at midnight and plan cardiac cath with possible PCI in  am. I have discussed this procedure at length with the patient and his wife including all risks and benefits. He agrees to proceed. He mentions itching with IV contrast load for CT yesterday. Will pre-treat with prednisone for possible contrast allergy.   Please see all labs and I/O in computer. Unable to include in note secondary to medical record issues.   Electronic Signatures: Burnell Blanks (MD)  (Signed 02-Jun-13 10:56)  Authored: General Aspect/Present Illness, History and Physical Exam, Review of System, Home Medications, EKG , Radiology, Allergies, Vital Signs/Nurse's Notes, Impression/Plan   Last Updated: 02-Jun-13 10:56 by Burnell Blanks (MD)

## 2014-11-12 NOTE — Discharge Summary (Signed)
PATIENT NAME:  Roy [name], Roy [name] MR#:  [id number] DATE OF BIRTH:  May 25, 1961  DATE OF ADMISSION:  12/20/2011 DATE OF DISCHARGE:  12/23/2011  PRESENTING COMPLAINT: Chest pain.   DISCHARGE DIAGNOSES:  1. Acute non-Q-wave myocardial infarction, status post cardiac catheterization with stent in OM1 lesion with 95% stenosis.   2. Type 2 diabetes, poorly controlled.  3. Hypertension.  4. Hyperlipidemia.   MEDICATIONS ON DISCHARGE:  1. Benazepril 10 mg p.o. daily.  2. Crestor 10 mg daily.  3. Pioglitazone 15 mg p.o. daily.  4. Visine for contact lens as needed.  5. Janumet XR 50/1,000 extended-release 2 tablets once a day. Start taking from June 6.  6. Tylenol 325 mg p.o. q. 4 hours.  7. Aspirin 325 mg p.o. daily.  8. Metoprolol XL 25 mg p.o. daily.  9. Plavix 75 mg daily.  10. Nitroglycerin 0.4 mg, 1 tablet sublingual as needed for chest pain.   DIET: 1800 calorie ADA diet.   FOLLOWUP: 1. Follow up with Dr. Rockey Situ 06/20 at 10:30 a.m. 2. Follow up with Dr. Harlan Stains, primary care physician, in 1 to 2 weeks.  3. Follow up with Dr. Gabriel Carina of endocrinology 07/17 at  9:00 a.m. for diabetes.  LABORATORY, DIAGNOSTIC, AND RADIOLOGICAL DATA: Labs at discharge: Hemoglobin 12.3, platelet count 130, glucose 240, creatinine 0.94, electrolytes otherwise normal. EKG had normal sinus rhythm. Troponin peaked at 1.77. Hemoglobin A1c 10.7. PT-INR 13.3-1. Lipid profile: Cholesterol is 125, triglycerides 64, HDL 37, LDL 75. CT of the chest shows coronary artery disease. No pulmonary embolus. LFTs within normal limits.   CONSULTATIONS: Cardiology consultation with Dr. Rockey Situ.   PROCEDURES: Cardiac catheterization done by Dr. Rockey Situ and Dr. Clayborn Bigness. Percutaneous coronary intervention with stent  to OM1, which was a 95% lesion. His mid circumflex had 60% stenosis.   BRIEF SUMMARY OF HOSPITAL COURSE: [name] is a 54 year old Caucasian gentleman who was admitted with:  1. Acute non-Q-wave myocardial  infarction: The patient was admitted on the telemetry floor. He ruled in for acute myocardial infarction. He was started on IV heparin, beta blockers, nitroglycerin, and was chest pain free after admission. He remained hemodynamically stable. He was seen by Dr. Rockey Situ who recommended cardiac catheterization. Results as above were noted. He underwent PCI of OM1, 95% lesion. The patient tolerated the procedure well. His creatinine remained stable after the procedure.  2. Hypertension: Metoprolol and benazepril were continued.  3. Type 2 diabetes: The patient will resume his Actos and we will resume his Janumet 48 hours after the catheterization procedure and we will set him up to follow up with Dr. Gabriel Carina to get his diabetes under better control. Lifestyle and exercise were discussed with the patient.  4. Hyperlipidemia: The patient was continued on Crestor.   Hospital stay otherwise remained stable.   CODE STATUS: The patient remained a FULL CODE.   TIME SPENT: 40 minutes.    ____________________________ [name], MD sap:bjt D: 12/23/2011 11:25:39 ET T: 12/24/2011 10:05:41 ET JOB#: [id number]  cc: Minna Merritts, MD A. Lavone Orn, MD Dr. Harlan Stains Ilda Basset MD ELECTRONICALLY SIGNED 12/27/2011 14:08

## 2014-11-12 NOTE — Consult Note (Signed)
76 yr olde white male with CRFs DM, HTN, hyperlipidemia presents with SSCP radiating to jaw/shoulder, relieved with third NTG..  [place]:  asa in ED, IV heparin, NTG, metoprolol\, MS04,  stress tests vs cath depending on enzymes.  cardiology consult ordered.  Uncontrolled DM:  holding oral meds,  recent use of steroids cited as cause of elevations.  checking [id number] Hypertension; holding benazepirll  metoprolol started Hyperlpidemia:  contineu Crestor, checking lipids in aM     Electronic Signatures: Deborra Medina (MD) (Signed on 01-Jun-13 20:15)  Authored   Last Updated: 02-Jun-13 08:52 by Deborra Medina (MD)

## 2015-04-28 ENCOUNTER — Emergency Department (INDEPENDENT_AMBULATORY_CARE_PROVIDER_SITE_OTHER): Payer: BLUE CROSS/BLUE SHIELD

## 2015-04-28 ENCOUNTER — Encounter (HOSPITAL_COMMUNITY): Payer: Self-pay | Admitting: Emergency Medicine

## 2015-04-28 ENCOUNTER — Other Ambulatory Visit (HOSPITAL_COMMUNITY)
Admission: RE | Admit: 2015-04-28 | Discharge: 2015-04-28 | Disposition: A | Discharge status: Home / Self Care | Payer: BLUE CROSS/BLUE SHIELD | Source: Ambulatory Visit | Attending: Family Medicine | Admitting: Family Medicine

## 2015-04-28 ENCOUNTER — Emergency Department (HOSPITAL_COMMUNITY): Payer: BLUE CROSS/BLUE SHIELD

## 2015-04-28 ENCOUNTER — Emergency Department (INDEPENDENT_AMBULATORY_CARE_PROVIDER_SITE_OTHER)
Admission: EM | Admit: 2015-04-28 | Discharge: 2015-04-28 | Disposition: A | Discharge status: Home / Self Care | Payer: BLUE CROSS/BLUE SHIELD | Source: Home / Self Care | Attending: Family Medicine | Admitting: Family Medicine

## 2015-04-28 DIAGNOSIS — N135 Crossing vessel and stricture of ureter without hydronephrosis: Secondary | ICD-10-CM

## 2015-04-28 DIAGNOSIS — N219 Calculus of lower urinary tract, unspecified: Secondary | ICD-10-CM

## 2015-04-28 LAB — POCT URINALYSIS DIP (DEVICE)
BILIRUBIN URINE: NEGATIVE
Glucose, UA: NEGATIVE mg/dL
Ketones, ur: NEGATIVE mg/dL
LEUKOCYTES UA: NEGATIVE
NITRITE: NEGATIVE
Protein, ur: NEGATIVE mg/dL
Specific Gravity, Urine: >=1.03 (ref 1.005–1.030)
Urobilinogen, UA: 0.2 mg/dL (ref 0.0–1.0)
pH: 5.5 (ref 5.0–8.0)

## 2015-04-28 MED ORDER — KETOROLAC TROMETHAMINE 30 MG/ML IJ SOLN
INTRAMUSCULAR | Status: AC
  Filled 2015-04-28: qty 1

## 2015-04-28 MED ORDER — KETOROLAC TROMETHAMINE 30 MG/ML IJ SOLN
30.0000 mg | Freq: Once | INTRAMUSCULAR | Status: AC
  Administered 2015-04-28: 30 mg via INTRAMUSCULAR

## 2015-04-28 MED ORDER — CEPHALEXIN 500 MG PO CAPS
500.0000 mg | ORAL_CAPSULE | Freq: Four times a day (QID) | ORAL | Status: DC
Start: 2015-04-28 — End: 2015-10-11

## 2015-04-28 NOTE — ED Provider Notes (Signed)
CSN: [id number]     Arrival date & time 04/28/15  1616 History   First MD Initiated Contact with Patient 04/28/15 1625     Chief Complaint  Patient presents with  . Urinary Tract Infection   (Consider location/radiation/quality/duration/timing/severity/associated sxs/prior Treatment) HPI Comments: 54 year old male complaining of discomfort at the end of the penis. It is a pulsation type feeling that waxes and wanes. He has had for a week. He was better for 23 days after taking amoxicillin but today it came back. He denies dysuria. He states his urine appears dark as if it was bloody. He has a history of 19 kidney stones. 3 weeks ago he had flank pain typical of his usual kidney stones and he was able to pass that without intervention. His urologist as given him standby oxycodone and Spryx nasal spray to help with pain. He has not taken that recently. Denies nausea, fever, vomiting or diaphoresis. He is able to void without apparent obstruction.  His past medical history is extensive including cardiovascular disease and multiple surgeries and has been reviewed.    Past Medical History  Diagnosis Date  . Secondary and unspecified malignant neoplasm of lymph nodes, site unspecified   . Diabetes mellitus     type II  . Hyperlipidemia   . Hypertension   . Melanoma ([place])   . CAD (coronary artery disease)     a. 12/2011 NSTEMI/PCI: Stenting of the OM;  b. 06/2012 NSTEMI/PCI: LCX 100%->BMS;  c. 07/2012 Cath: dissectied LM & LCX with patent LCX/OM stents->CABG x 3: LIMA->LAD, VG->OM1->OM2.  . Shortness of breath   . Aortic insufficiency     a. 07/2012 s/p AVR: #25 Magnaease Bioprosthetic Valve (performed @ time of CABG).  . Diverticulitis   . Renal disorder    Past Surgical History  Procedure Laterality Date  . Rotator cuff repair      RIGHT  . Knee arthoscopy      LEFT KNEE  . Ulnar tunnel release      RIGHT  . Keratotomies      BILATERAL  . Septoplasty    . Tonsillectomy    . Knee  arthroscopy  06/28/2012    Procedure: ARTHROSCOPY KNEE;  Surgeon: Vickey Huger, MD;  Location: [facility];  Service: Orthopedics;  Laterality: Left;  . Cardiac catheterization  12-22-11    with OM1 Promus stent 12/22/11  . Knee surgery      left  . Eye surgery    . Tee without cardioversion  07/28/2012    Procedure: TRANSESOPHAGEAL ECHOCARDIOGRAM (TEE);  Surgeon: Thayer Headings, MD;  Location: [facility];  Service: Cardiovascular;  Laterality: N/A;  . Aortic valve replacement  08/02/2012    Procedure: AORTIC VALVE REPLACEMENT (AVR);  Surgeon: Melrose Nakayama, MD;  Location: [facility];  Service: Open Heart Surgery;  Laterality: N/A;  . Intraoperative transesophageal echocardiogram  08/02/2012    Procedure: INTRAOPERATIVE TRANSESOPHAGEAL ECHOCARDIOGRAM;  Surgeon: Melrose Nakayama, MD;  Location: [facility];  Service: Open Heart Surgery;  Laterality: N/A;  . Coronary artery bypass graft  08/02/2012    Procedure: CORONARY ARTERY BYPASS GRAFTING (CABG);  Surgeon: Melrose Nakayama, MD;  Location: [facility];  Service: Open Heart Surgery;  Laterality: N/A;  . Endovein harvest of greater saphenous vein  08/02/2012    Procedure: ENDOVEIN HARVEST OF GREATER SAPHENOUS VEIN;  Surgeon: Melrose Nakayama, MD;  Location: [facility];  Service: Open Heart Surgery;  Laterality: Left;  upper and lower leg  . Coronary angiogram  07/30/2012    Procedure: CORONARY ANGIOGRAM;  Surgeon: Minus Breeding, MD;  Location: [facility] CATH LAB;  Service: Cardiovascular;;   Family History  Problem Relation Age of Onset  . Coronary artery disease Mother   . Heart disease Father   . Heart attack Paternal Grandfather   . Lung cancer Mother   . Diabetes Paternal Grandmother   . Hyperlipidemia Brother   . Hyperlipidemia      father's side  . Melanoma      fh   Social History  Substance Use Topics  . Smoking status: Never Smoker   . Smokeless tobacco: Never Used  . Alcohol Use: No    Review of Systems  Constitutional: Negative for fever,  activity change and fatigue.  HENT: Negative.   Respiratory: Negative for shortness of breath.   Cardiovascular: Negative for chest pain and leg swelling.  Gastrointestinal: Negative.   Genitourinary: Positive for hematuria and penile pain. Negative for dysuria, flank pain, discharge, penile swelling, scrotal swelling and testicular pain.  Skin: Negative.   Neurological: Negative.     Allergies  Contrast media  Home Medications   Prior to Admission medications   Medication Sig Start Date End Date Taking? Authorizing Provider  aspirin 325 MG tablet Take 325 mg by mouth daily.   Yes Historical Provider, MD  finasteride (PROSCAR) 5 MG tablet Take 1 mg by mouth daily.    Yes Historical Provider, MD  metoprolol succinate (TOPROL-XL) 25 MG 24 hr tablet Take 25 mg by mouth daily. 07/15/12  Yes Minna Merritts, MD  pioglitazone (ACTOS) 30 MG tablet Take 30 mg by mouth daily.   Yes Historical Provider, MD  rosuvastatin (CRESTOR) 10 MG tablet Take 1 tablet (10 mg total) by mouth once a week. 01/05/14  Yes Jettie Booze, MD  sitaGLIPtan-metformin (JANUMET) 50-1000 MG per tablet Take 1 tablet by mouth 2 (two) times daily with a meal.    Yes Historical Provider, MD  amoxicillin (AMOXIL) 500 MG tablet Take 4 tablets 1 hour prior to dental appointment 05/04/14   Jettie Booze, MD  cephALEXin (KEFLEX) 500 MG capsule Take 1 capsule (500 mg total) by mouth 4 (four) times daily. 04/28/15   Janne Napoleon, NP  losartan (COZAAR) 25 MG tablet Take 25 mg by mouth daily.    Historical Provider, MD  metFORMIN (GLUCOPHAGE-XR) 500 MG 24 hr tablet Take 500 mg by mouth 2 (two) times daily.    Historical Provider, MD  nitroGLYCERIN (NITROSTAT) 0.4 MG SL tablet Place 0.4 mg under the tongue every 5 (five) minutes as needed for chest pain.    Historical Provider, MD  oxyCODONE-acetaminophen (PERCOCET) 5-325 MG per tablet Take 1 tablet by mouth every 6 (six) hours as needed for moderate pain. Patient not taking:  Reported on 09/07/2014 03/02/14   Evelina Bucy, MD  pioglitazone (ACTOS) 15 MG tablet Take 30 mg by mouth daily.     Historical Provider, MD  tamsulosin (FLOMAX) 0.4 MG CAPS capsule Take 1 capsule (0.4 mg total) by mouth daily. Patient not taking: Reported on 09/07/2014 03/02/14   Evelina Bucy, MD   Meds Ordered and Administered this Visit   Medications  ketorolac (TORADOL) 30 MG/ML injection 30 mg (30 mg Intramuscular Given 04/28/15 1724)    BP 124/77 mmHg  Pulse 72  Temp(Src) 97 F (36.1 C) (Oral)  Resp 20  SpO2 98% No data found.   Physical Exam  Constitutional: He is oriented to person, place, and time. He appears well-developed and well-nourished.  No distress.  Neck: Normal range of motion. Neck supple.  Cardiovascular: Normal rate, regular rhythm and normal heart sounds.   Pulmonary/Chest: Effort normal and breath sounds normal.  Abdominal: Soft. There is no tenderness.  Neurological: He is alert and oriented to person, place, and time. He exhibits normal muscle tone.  Skin: Skin is warm and dry. No erythema.  Psychiatric: He has a normal mood and affect. His behavior is normal.  Nursing note and vitals reviewed.   ED Course  Procedures (including critical care time)  Labs Review Labs Reviewed  POCT URINALYSIS DIP (DEVICE) - Abnormal; Notable for the following:    Hgb urine dipstick MODERATE (*)    All other components within normal limits  URINE CULTURE    Imaging Review Dg Abd 1 View  04/28/2015   CLINICAL DATA:  Left-sided abdominal pain for 1 month. Urethral pain. Initial encounter.  EXAM: ABDOMEN - 1 VIEW  COMPARISON:  CT of the abdomen and pelvis from 04/11/2015, and abdominal radiograph performed 09/26/2010  FINDINGS: Numerous nonobstructing left renal stones are again seen. Known right-sided renal stones are also noted, though slightly less well characterized.  On correlation with the recent prior CT, the previously noted left ureteral stone appears to have  progressed distally to the level of the left vesicoureteral junction, measuring 7 mm.  The visualized bowel gas pattern is grossly unremarkable. No acute osseous abnormalities are seen.  IMPRESSION: 1. The previously noted left ureteral stone appears to have progressed distally to the level of the left vesicoureteral junction, measuring 7 mm. Given the patient's symptoms, this is likely still causing obstruction. 2. Numerous nonobstructing bilateral renal stones again seen, more prominent on the left.   Electronically Signed   By: Garald Balding M.D.   On: 04/28/2015 17:50     Visual Acuity Review  Right Eye Distance:   Left Eye Distance:   Bilateral Distance:    Right Eye Near:   Left Eye Near:    Bilateral Near:         MDM   1. Ureterovesical junction (UVJ) obstruction   2. Calculus of lower urinary tract     Keflex as directed No purulent products. Take here oxycodone as previously prescribed Drink plenty fluids stay well-hydrated For any worsening new symptoms or problems, fever, vomiting or increased pain could probably. See your urologist on Monday. Call in the morning for appointment that day.   Janne Napoleon, NP 04/28/15 [phone]

## 2015-04-28 NOTE — ED Notes (Signed)
C/o UTI sx onset 1 week; hx of 19 kidney stones; passed a stone x1 week ago Sx today include n/v, dysuria A&O x4... No acute distress.

## 2015-04-30 LAB — URINE CULTURE
Culture: NO GROWTH
Special Requests: NORMAL

## 2015-04-30 NOTE — ED Notes (Signed)
Final repot of urine C&S negative, no further action required

## 2015-10-10 NOTE — Progress Notes (Signed)
Cardiology Office Note   Date:  10/11/2015   ID:  Roy [name], DOB [birth year], MRN [id number]  PCP:  Vidal Schwalbe, MD  Cardiologist:  Dr. Irish Lack    Chief Complaint  Patient presents with  . Coronary Artery Disease    no chest pain      History of Present Illness: Roy [name] is a 55 y.o. male who presents for CAD. He had an MI in 6/13 with a stent placed, with jaw pain and chest pain. In 12/13, he had knee surgery and 2 days later, he had an MI. He had a stent placed at that time. 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 He feels that the valve was damaged during the cath and wanted a new cardiologist, so he saw Dr. Irish Lack for the first time in 2014. He feels better now and had a tissue valve due to not wanting to take coumadin.  + carotid disease. Last nuc 08/2014 without ischemia.   Today no complaints, no chest pain, no SOB.  No ankle edema. Is exercising daily and works outside. Watching his diet and has lost 15 lbs.  Hopes to get off some medicine.    His glucose is stable as well his PCP follows his lipids.  He wants to know how long his valve will last.   Past Medical History  Diagnosis Date  . Secondary and unspecified malignant neoplasm of lymph nodes, site unspecified   . Diabetes mellitus     type II  . Hyperlipidemia   . Hypertension   . Melanoma ([place])   . CAD (coronary artery disease)     a. 12/2011 NSTEMI/PCI: Stenting of the OM;  b. 06/2012 NSTEMI/PCI: LCX 100%->BMS;  c. 07/2012 Cath: dissectied LM & LCX with patent LCX/OM stents->CABG x 3: LIMA->LAD, VG->OM1->OM2.  . Shortness of breath   . Aortic insufficiency     a. 07/2012 s/p AVR: #25 Magnaease Bioprosthetic Valve (performed @ time of CABG).  . Diverticulitis   . Renal disorder     Past Surgical History  Procedure Laterality Date  . Rotator cuff repair      RIGHT  . Knee arthoscopy      LEFT KNEE  . Ulnar tunnel release      RIGHT  .  Keratotomies      BILATERAL  . Septoplasty    . Tonsillectomy    . Knee arthroscopy  06/28/2012    Procedure: ARTHROSCOPY KNEE;  Surgeon: Vickey Huger, MD;  Location: [facility];  Service: Orthopedics;  Laterality: Left;  . Cardiac catheterization  12-22-11    with OM1 Promus stent 12/22/11  . Knee surgery      left  . Eye surgery    . Tee without cardioversion  07/28/2012    Procedure: TRANSESOPHAGEAL ECHOCARDIOGRAM (TEE);  Surgeon: Thayer Headings, MD;  Location: [facility];  Service: Cardiovascular;  Laterality: N/A;  . Aortic valve replacement  08/02/2012    Procedure: AORTIC VALVE REPLACEMENT (AVR);  Surgeon: Melrose Nakayama, MD;  Location: [facility];  Service: Open Heart Surgery;  Laterality: N/A;  . Intraoperative transesophageal echocardiogram  08/02/2012    Procedure: INTRAOPERATIVE TRANSESOPHAGEAL ECHOCARDIOGRAM;  Surgeon: Melrose Nakayama, MD;  Location: [place];  Service: Open Heart Surgery;  Laterality: N/A;  . Coronary artery bypass graft  08/02/2012    Procedure: CORONARY ARTERY BYPASS GRAFTING (CABG);  Surgeon: Melrose Nakayama, MD;  Location: [place];  Service:  Open Heart Surgery;  Laterality: N/A;  . Endovein harvest of greater saphenous vein  08/02/2012    Procedure: ENDOVEIN HARVEST OF GREATER SAPHENOUS VEIN;  Surgeon: Melrose Nakayama, MD;  Location: [place];  Service: Open Heart Surgery;  Laterality: Left;  upper and lower leg  . Coronary angiogram  07/30/2012    Procedure: CORONARY ANGIOGRAM;  Surgeon: Minus Breeding, MD;  Location: [facility] CATH LAB;  Service: Cardiovascular;;     Current Outpatient Prescriptions  Medication Sig Dispense Refill  . aspirin 325 MG tablet Take 325 mg by mouth daily.    Marland Kitchen losartan (COZAAR) 25 MG tablet Take 25 mg by mouth daily.    . metoprolol succinate (TOPROL-XL) 25 MG 24 hr tablet Take 25 mg by mouth daily.    . nitroGLYCERIN (NITROSTAT) 0.4 MG SL tablet Place 1 tablet (0.4 mg total) under the tongue every 5 (five) minutes as needed for chest  pain. 25 tablet 3  . pioglitazone (ACTOS) 30 MG tablet Take 30 mg by mouth daily.    . rosuvastatin (CRESTOR) 20 MG tablet Take 1 tablet (20 mg total) by mouth 2 (two) times a week. 15 tablet 3  . sitaGLIPtan-metformin (JANUMET) 50-1000 MG per tablet Take 1 tablet by mouth 2 (two) times daily with a meal.     . tamsulosin (FLOMAX) 0.4 MG CAPS capsule Take 1 capsule (0.4 mg total) by mouth daily. 30 capsule 0  . [DISCONTINUED] famotidine (PEPCID) 20 MG tablet Take 1 tablet (20 mg total) by mouth 2 (two) times daily. X 4 days (Patient not taking: Reported on 09/07/2014) 8 tablet 0   No current facility-administered medications for this visit.    Allergies:   Contrast media    Social History:  The patient  reports that he has never smoked. He has never used smokeless tobacco. He reports that he does not drink alcohol or use illicit drugs.   Family History:  The patient's family history includes Coronary artery disease in his mother; Diabetes in his paternal grandmother; Heart attack in his paternal grandfather; Heart disease in his father; Hyperlipidemia in his brother; Lung cancer in his mother.    ROS:  General:no colds or fevers, no weight changes Skin:no rashes or ulcers HEENT:no blurred vision, no congestion CV:see HPI PUL:see HPI GI:no diarrhea constipation or melena, no indigestion GU:no hematuria, no dysuria MS:no joint pain, no claudication Neuro:no syncope, no lightheadedness Endo:+ diabetes, no thyroid disease  Wt Readings from Last 3 Encounters:  10/11/15 209 lb (94.802 kg)  09/11/14 215 lb (97.523 kg)  09/07/14 210 lb (95.255 kg)     PHYSICAL EXAM: VS:  BP 118/70 mmHg  Pulse 68  Ht 5\' 9"  (1.753 m)  Wt 209 lb (94.802 kg)  BMI 30.85 kg/m2 , BMI Body mass index is 30.85 kg/(m^2). General:Pleasant affect, NAD Skin:Warm and dry, brisk capillary refill HEENT:normocephalic, sclera clear, mucus membranes moist Neck:supple, no JVD, + bruits  Heart:S1S2 RRR with 1/6  systolic murmur, no gallup, rub or click Lungs:clear without rales, rhonchi, or wheezes [id number], non tender, + BS, do not palpate liver spleen or masses Ext:no lower ext edema, 2+ pedal pulses, 2+ radial pulses Neuro:alert and oriented X 3, MAE, follows commands, + facial symmetry    EKG:  EKG is ordered today. The ekg ordered today demonstrates SR old inf MI. No acute changes   Recent Labs: No results found for requested labs within last 365 days.    Lipid Panel    Component Value Date/Time  CHOL 157 07/31/2012 0319   CHOL 105 07/01/2012 0019   TRIG 39 07/31/2012 0319   TRIG 47 07/01/2012 0019   HDL 54 07/31/2012 0319   HDL 35* 07/01/2012 0019   CHOLHDL 2.9 07/31/2012 0319   VLDL 8 07/31/2012 0319   VLDL 9 07/01/2012 0019   [place] 95 07/31/2012 0319   LDLCALC 61 07/01/2012 0019       Other studies Reviewed: Additional studies/ records that were reviewed today include: lat cath in 2013. .  OP note: AORTIC VALVE REPLACEMENT (AVR)# 25 MAGNAEASEE BIOPROSTHETIC INTRAOPERATIVE TRANSESOPHAGEAL ECHOCARDIOGRAM CORONARY ARTERY BYPASS GRAFTING (CABG)X3 LIMA-LAD; SEQ SVG-OM1-OM2 ENDOVEIN HARVEST OF GREATER SAPHENOUS VEIN LEFT THIGH   ASSESSMENT AND PLAN:  CAD  Continue Aspirin Tablet, 81 MG, 1 tablet, Orally, Once a day Notes: No angina. s/p CABG. Prior PCI before CABG.   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 Dyslipidemia -    Crestor Tablet, 10 mg, 1 tablet, Orally, Once a week. Last LDL in 6/15 was 101. LDL target 70 given prior MI. Dr. Dema Severin follows cholesterol   4. Aortic insufficiency  Notes: Needs regular dental cleanings. Use amoxicillin for SBE prophylaxis. s/p tissue AVR. We discussed repeating echo but with no complaints he prefers not to have done.  He will follow up with Dr. Irish Lack in 1 year.               Current medicines are reviewed with the patient today.  The patient Has no concerns regarding medicines.  The following changes have been made:  See above Labs/ tests ordered today include:see above  Disposition:   FU:  see above  Lennie Muckle, NP  10/11/2015 10:24 AM    [facility] [address], [place], [place], [place] Phone: [phone]; Fax: [phone]

## 2015-10-11 ENCOUNTER — Ambulatory Visit (INDEPENDENT_AMBULATORY_CARE_PROVIDER_SITE_OTHER): Payer: BLUE CROSS/BLUE SHIELD | Admitting: Cardiology

## 2015-10-11 ENCOUNTER — Encounter: Payer: Self-pay | Admitting: Cardiology

## 2015-10-11 VITALS — BP 118/70 | HR 68 | Ht 69.0 in | Wt 209.0 lb

## 2015-10-11 DIAGNOSIS — Z952 Presence of prosthetic heart valve: Secondary | ICD-10-CM

## 2015-10-11 DIAGNOSIS — I252 Old myocardial infarction: Secondary | ICD-10-CM | POA: Diagnosis not present

## 2015-10-11 DIAGNOSIS — I2510 Atherosclerotic heart disease of native coronary artery without angina pectoris: Secondary | ICD-10-CM

## 2015-10-11 DIAGNOSIS — Z954 Presence of other heart-valve replacement: Secondary | ICD-10-CM | POA: Diagnosis not present

## 2015-10-11 DIAGNOSIS — I6523 Occlusion and stenosis of bilateral carotid arteries: Secondary | ICD-10-CM

## 2015-10-11 DIAGNOSIS — E785 Hyperlipidemia, unspecified: Secondary | ICD-10-CM | POA: Diagnosis not present

## 2015-10-11 MED ORDER — ROSUVASTATIN CALCIUM 20 MG PO TABS: 20.0000 mg | ORAL_TABLET | ORAL | Status: DC

## 2015-10-11 MED ORDER — NITROGLYCERIN 0.4 MG SL SUBL: 0.4000 mg | SUBLINGUAL_TABLET | SUBLINGUAL | Status: DC | PRN

## 2015-10-11 NOTE — Patient Instructions (Signed)
Medication Instructions:   Your physician recommends that you continue on your current medications as directed. Please refer to the Current Medication list given to you today.   If you need a refill on your cardiac medications before your next appointment, please call your pharmacy.  Labwork:  NONE ORDER TODAY    Testing/Procedures:  NONE ORDER TODAY    Follow-Up:  Your physician wants you to follow-up in: [place] will receive a reminder letter in the mail two months in advance. If you don't receive a letter, please call our office to schedule the follow-up appointment.     Any Other Special Instructions Will Be Listed Below (If Applicable).

## 2015-12-19 ENCOUNTER — Encounter: Payer: Self-pay | Admitting: Neurology

## 2015-12-19 ENCOUNTER — Ambulatory Visit (INDEPENDENT_AMBULATORY_CARE_PROVIDER_SITE_OTHER): Payer: BLUE CROSS/BLUE SHIELD | Admitting: Neurology

## 2015-12-19 VITALS — BP 124/79 | HR 93 | Ht 69.5 in | Wt 212.0 lb

## 2015-12-19 DIAGNOSIS — G43109 Migraine with aura, not intractable, without status migrainosus: Secondary | ICD-10-CM | POA: Diagnosis not present

## 2015-12-19 DIAGNOSIS — M542 Cervicalgia: Secondary | ICD-10-CM

## 2015-12-19 DIAGNOSIS — R110 Nausea: Secondary | ICD-10-CM

## 2015-12-19 MED ORDER — CYCLOBENZAPRINE HCL 10 MG PO TABS
10.0000 mg | ORAL_TABLET | Freq: Three times a day (TID) | ORAL | Status: DC | PRN
Start: 2015-12-19 — End: 2016-12-23

## 2015-12-19 MED ORDER — ONDANSETRON 4 MG PO TBDP
ORAL_TABLET | ORAL | Status: DC
Start: 2015-12-19 — End: 2016-09-25

## 2015-12-19 NOTE — Patient Instructions (Addendum)
Remember to drink plenty of fluid, eat healthy meals and do not skip any meals. Try to eat protein with a every meal and eat a healthy snack such as fruit or nuts in between meals. Try to keep a regular sleep-wake schedule and try to exercise daily, particularly in the form of walking, 20-30 minutes a day, if you can.   As far as your medications are concerned, I would like to suggest: 1-2 Zofran every 8 hours as needed for nausea, migraine or vertigo/dizziness. Flexeril 10mg  at night for neck spasms.  Our phone number is [phone]. We also have an after hours call service for urgent matters and there is a physician on-call for urgent questions. For any emergencies you know to call 911 or go to the nearest emergency room

## 2015-12-19 NOTE — Progress Notes (Signed)
[facility]    Provider:  Dr Jaynee Eagles Referring Provider: Harlan Stains, MD Primary Care Physician:  Vidal Schwalbe, MD  CC:  "Dizzy spells, pain neck and migraine in eyes"  HPI:  Roy [name] is a 55 y.o. male here as a referral from Dr. Dema Severin for migraines. PMHx DM, HLD, HTN, CAD, aortic valve replacement on long-term anticoagulation, neck pain. Migraines started after his open heart surgery in January 2014, ever since he has headaches, random, they last 20 minutes, he can see but cannot think straight, needs to get over and let it pass, he gets holes in his vision, then he has a headache afterwards then done in 20 minutes, has nausea, light bothers him. He has dizziness. He gets room spinning. The room "takes off'. Has happened coming out of the shower and just standing by his truck. It first started years ago, getting more frequent, separate from the vision problems. In a month, th emost he ever has the migraine auras is once a week at most, lasts for 20 minutes. He carries stress in his shoulders and has neck pain. Father with migraines  Reviewed notes, labs and imaging from outside physicians, which showed:  hgba1c 7.6, cmp unremarkable creatinine 1.07 05/2015  CERVICAL SPINE - 2-3 VIEW  Personally reviewed imaging and agree with the following:  Findings: The cervical vertebrae are straightened in alignment. There is only minimal degenerative change at the C5-6 level with slight loss of disc space and mild spurring. No prevertebral soft tissue swelling is noted. The odontoid process is intact.  IMPRESSION: Slightly straightened alignment of the cervical vertebrae with mild degenerative disc disease at C5-6.  Review of Systems: Patient complains of symptoms per HPI as well as the following symptoms: blurred vision, loss of vision, headache, dizziness. Pertinent negatives per HPI. All others negative.   Social History   Social History  . Marital  Status: Married    Spouse Name: Jymmi Gylln  . Number of Children: 3  . Years of Education: 16   Occupational History  . [employer]    Social History Main Topics  . Smoking status: Never Smoker   . Smokeless tobacco: Never Used  . Alcohol Use: No  . Drug Use: No  . Sexual Activity: Not on file   Other Topics Concern  . Not on file   Social History Narrative   Lives with wife   Caffeine use: 1 cup /day    Family History  Problem Relation Age of Onset  . Coronary artery disease Mother   . Lung cancer Mother   . Heart disease Father   . Migraines Father   . Heart attack Paternal Grandfather   . Diabetes Paternal Grandmother   . Hyperlipidemia Brother   . Hyperlipidemia      father's side  . Melanoma      fh    Past Medical History  Diagnosis Date  . Secondary and unspecified malignant neoplasm of lymph nodes, site unspecified   . Diabetes mellitus     type II  . Hyperlipidemia   . Hypertension   . Melanoma ([place])   . CAD (coronary artery disease)     a. 12/2011 NSTEMI/PCI: Stenting of the OM;  b. 06/2012 NSTEMI/PCI: LCX 100%->BMS;  c. 07/2012 Cath: dissectied LM & LCX with patent LCX/OM stents->CABG x 3: LIMA->LAD, VG->OM1->OM2.  . Shortness of breath   . Aortic insufficiency     a. 07/2012 s/p AVR: #25 Magnaease Bioprosthetic Valve (performed @  time of CABG).  . Diverticulitis   . Renal disorder     Past Surgical History  Procedure Laterality Date  . Rotator cuff repair      RIGHT  . Knee arthoscopy      LEFT KNEE  . Ulnar tunnel release      RIGHT  . Keratotomies      BILATERAL  . Septoplasty    . Tonsillectomy    . Knee arthroscopy  06/28/2012    Procedure: ARTHROSCOPY KNEE;  Surgeon: Vickey Huger, MD;  Location: [place];  Service: Orthopedics;  Laterality: Left;  . Cardiac catheterization  12-22-11    with OM1 Promus stent 12/22/11  . Knee surgery      left  . Eye surgery    . Tee without cardioversion  07/28/2012    Procedure: TRANSESOPHAGEAL ECHOCARDIOGRAM  (TEE);  Surgeon: Thayer Headings, MD;  Location: [place];  Service: Cardiovascular;  Laterality: N/A;  . Aortic valve replacement  08/02/2012    Procedure: AORTIC VALVE REPLACEMENT (AVR);  Surgeon: Melrose Nakayama, MD;  Location: [place];  Service: Open Heart Surgery;  Laterality: N/A;  . Intraoperative transesophageal echocardiogram  08/02/2012    Procedure: INTRAOPERATIVE TRANSESOPHAGEAL ECHOCARDIOGRAM;  Surgeon: Melrose Nakayama, MD;  Location: [place];  Service: Open Heart Surgery;  Laterality: N/A;  . Coronary artery bypass graft  08/02/2012    Procedure: CORONARY ARTERY BYPASS GRAFTING (CABG);  Surgeon: Melrose Nakayama, MD;  Location: [place];  Service: Open Heart Surgery;  Laterality: N/A;  . Endovein harvest of greater saphenous vein  08/02/2012    Procedure: ENDOVEIN HARVEST OF GREATER SAPHENOUS VEIN;  Surgeon: Melrose Nakayama, MD;  Location: [place];  Service: Open Heart Surgery;  Laterality: Left;  upper and lower leg  . Coronary angiogram  07/30/2012    Procedure: CORONARY ANGIOGRAM;  Surgeon: Minus Breeding, MD;  Location: [facility] CATH LAB;  Service: Cardiovascular;;    Current Outpatient Prescriptions  Medication Sig Dispense Refill  . aspirin 325 MG tablet Take 325 mg by mouth daily.    . finasteride (PROPECIA) 1 MG tablet Take 1 mg by mouth daily.  4  . losartan (COZAAR) 25 MG tablet Take 25 mg by mouth daily.    . pioglitazone (ACTOS) 30 MG tablet Take 30 mg by mouth daily.    . rosuvastatin (CRESTOR) 20 MG tablet Take 1 tablet (20 mg total) by mouth 2 (two) times a week. 15 tablet 3  . sitaGLIPtan-metformin (JANUMET) 50-1000 MG per tablet Take 1 tablet by mouth daily.     . tamsulosin (FLOMAX) 0.4 MG CAPS capsule Take 1 capsule (0.4 mg total) by mouth daily. 30 capsule 0  . amoxicillin (AMOXIL) 500 MG capsule Take 500 mg by mouth. Reported on 12/19/2015  0  . cyclobenzaprine (FLEXERIL) 10 MG tablet Take 1 tablet (10 mg total) by mouth 3 (three) times daily as needed for  muscle spasms. 90 tablet 10  . metoprolol succinate (TOPROL-XL) 25 MG 24 hr tablet Take 25 mg by mouth daily. Reported on 12/19/2015    . nitroGLYCERIN (NITROSTAT) 0.4 MG SL tablet Place 1 tablet (0.4 mg total) under the tongue every 5 (five) minutes as needed for chest pain. (Patient not taking: Reported on 12/19/2015) 25 tablet 3  . ondansetron (ZOFRAN ODT) 4 MG disintegrating tablet Take 1-2 pills every 8 hours as needed for vertigo, nausea or migraine 60 tablet 6  . [DISCONTINUED] famotidine (PEPCID) 20 MG tablet Take 1 tablet (20 mg total)  by mouth 2 (two) times daily. X 4 days (Patient not taking: Reported on 09/07/2014) 8 tablet 0   No current facility-administered medications for this visit.    Allergies as of 12/19/2015 - Review Complete 12/19/2015  Allergen Reaction Noted  . Contrast media [iodinated diagnostic agents] Other (See Comments)   . Other  12/19/2015    Vitals: BP 124/79 mmHg  Pulse 93  Ht 5' 9.5" (1.765 m)  Wt 212 lb (96.163 kg)  BMI 30.87 kg/m2 Last Weight:  Wt Readings from Last 1 Encounters:  12/19/15 212 lb (96.163 kg)   Last Height:   Ht Readings from Last 1 Encounters:  12/19/15 5' 9.5" (1.765 m)   Physical exam: Exam: Gen: NAD, conversant, well nourised, obese, well groomed                     CV: RRR, no MRG. No Carotid Bruits. No peripheral edema, warm, nontender Eyes: Conjunctivae clear without exudates or hemorrhage  Neuro: Detailed Neurologic Exam  Speech:    Speech is normal; fluent and spontaneous with normal comprehension.  Cognition:    The patient is oriented to person, place, and time;     recent and remote memory intact;     language fluent;     normal attention, concentration,     fund of knowledge Cranial Nerves:    The pupils are equal, round, and reactive to light. The fundi are normal and spontaneous venous pulsations are present. Visual fields are full to finger confrontation. Extraocular movements are intact. Trigeminal  sensation is intact and the muscles of mastication are normal. The face is symmetric. The palate elevates in the midline. Hearing intact. Voice is normal. Shoulder shrug is normal. The tongue has normal motion without fasciculations.   Coordination:    Normal finger to nose and heel to shin. Normal rapid alternating movements.   Gait:    Heel-toe and tandem gait are normal.   Motor Observation:    No asymmetry, no atrophy, and no involuntary movements noted. Tone:    Normal muscle tone.    Posture:    Posture is normal. normal erect    Strength:    Strength is V/V in the upper and lower limbs.      Sensation: intact to LT     Reflex Exam:  DTR's:    Deep tendon reflexes in the upper and lower extremities are normal bilaterally.   Toes:    The toes are downgoing bilaterally.   Clonus:    Clonus is absent.    Assessment/Plan:  55 year old with migraine with aura, musculoskeletal neck pain and occ vertigo.  - suggested vestibular therapy for epley maneuver teaching, he declines at this time - As far as your medications are concerned, I would like to suggest: 1-2 Zofran every 8 hours as needed for nausea, migraine or vertigo/dizziness. Flexeril 10mg  at night for neck spasms. - declines MRI of the brain to evaluate for stroke or other causes of vertigo and dizziness - Flexeril prn for neck pain, endorses no red flags for compression  To prevent or relieve headaches, try the following: Cool Compress. Lie down and place a cool compress on your head.  Avoid headache triggers. If certain foods or odors seem to have triggered your migraines in the past, avoid them. A headache diary might help you identify triggers.  Include physical activity in your daily routine. Try a daily walk or other moderate aerobic exercise.  Manage stress. Find  healthy ways to cope with the stressors, such as delegating tasks on your to-do list.  Practice relaxation techniques. Try deep breathing, yoga,  massage and visualization.  Eat regularly. Eating regularly scheduled meals and maintaining a healthy diet might help prevent headaches. Also, drink plenty of fluids.  Follow a regular sleep schedule. Sleep deprivation might contribute to headaches Consider biofeedback. With this mind-body technique, you learn to control certain bodily functions - such as muscle tension, heart rate and blood pressure - to prevent headaches or reduce headache pain.    Proceed to emergency room if you experience new or worsening symptoms or symptoms do not resolve, if you have new neurologic symptoms or if headache is severe, or for any concerning symptom.    Sarina Ill, MD  [facility] [address] [place], [place] [address]  Phone [phone] Fax [phone]

## 2015-12-22 ENCOUNTER — Encounter: Payer: Self-pay | Admitting: Neurology

## 2016-04-09 ENCOUNTER — Other Ambulatory Visit: Payer: Self-pay | Admitting: Orthopedic Surgery

## 2016-04-09 DIAGNOSIS — M25511 Pain in right shoulder: Secondary | ICD-10-CM

## 2016-04-20 ENCOUNTER — Ambulatory Visit
Admission: RE | Admit: 2016-04-20 | Discharge: 2016-04-20 | Disposition: A | Discharge status: Home / Self Care | Payer: BLUE CROSS/BLUE SHIELD | Source: Ambulatory Visit | Attending: Orthopedic Surgery | Admitting: Orthopedic Surgery

## 2016-04-20 DIAGNOSIS — M25511 Pain in right shoulder: Secondary | ICD-10-CM

## 2016-04-24 DIAGNOSIS — G8929 Other chronic pain: Secondary | ICD-10-CM | POA: Insufficient documentation

## 2016-05-29 ENCOUNTER — Other Ambulatory Visit: Payer: Self-pay | Admitting: Cardiology

## 2016-06-03 DIAGNOSIS — Z9889 Other specified postprocedural states: Secondary | ICD-10-CM | POA: Insufficient documentation

## 2016-06-25 ENCOUNTER — Telehealth: Payer: Self-pay | Admitting: Interventional Cardiology

## 2016-06-25 NOTE — Telephone Encounter (Signed)
Called patient.  Left msg advising patient that he would need to continue taking the amoxicillin prior to dental visits.  The valve replacement doesn't discontinue the pre-use of amoxicillin prior to dental procedures.

## 2016-06-25 NOTE — Telephone Encounter (Signed)
New message      Pt had a valve replacement jan 2017.  He want to know if he can stop taking amoxicillin prior to dentist visits.  Please call

## 2016-09-25 ENCOUNTER — Ambulatory Visit (INDEPENDENT_AMBULATORY_CARE_PROVIDER_SITE_OTHER): Payer: BLUE CROSS/BLUE SHIELD | Admitting: Interventional Cardiology

## 2016-09-25 ENCOUNTER — Encounter: Payer: Self-pay | Admitting: Interventional Cardiology

## 2016-09-25 VITALS — BP 134/84 | HR 78 | Ht 69.0 in | Wt 223.4 lb

## 2016-09-25 DIAGNOSIS — I10 Essential (primary) hypertension: Secondary | ICD-10-CM

## 2016-09-25 DIAGNOSIS — E1159 Type 2 diabetes mellitus with other circulatory complications: Secondary | ICD-10-CM | POA: Diagnosis not present

## 2016-09-25 DIAGNOSIS — Z9861 Coronary angioplasty status: Secondary | ICD-10-CM | POA: Diagnosis not present

## 2016-09-25 DIAGNOSIS — I351 Nonrheumatic aortic (valve) insufficiency: Secondary | ICD-10-CM

## 2016-09-25 DIAGNOSIS — I2510 Atherosclerotic heart disease of native coronary artery without angina pectoris: Secondary | ICD-10-CM | POA: Diagnosis not present

## 2016-09-25 DIAGNOSIS — E782 Mixed hyperlipidemia: Secondary | ICD-10-CM | POA: Diagnosis not present

## 2016-09-25 MED ORDER — ROSUVASTATIN CALCIUM 20 MG PO TABS: 20.0000 mg | ORAL_TABLET | Freq: Every day | ORAL | 3 refills | Status: DC

## 2016-09-25 MED ORDER — ROSUVASTATIN CALCIUM 20 MG PO TABS: 20.0000 mg | ORAL_TABLET | ORAL | 6 refills | Status: DC

## 2016-09-25 MED ORDER — AMOXICILLIN 500 MG PO CAPS: ORAL_CAPSULE | ORAL | 3 refills | Status: DC

## 2016-09-25 NOTE — Patient Instructions (Signed)
Medication Instructions:  1) Take 4 tablets (2,000mg ) of Amoxicillin one hour prior to any dental work. 2) INCREASE Crestor to 20mg  once daily  Labwork: None  Testing/Procedures: None  Follow-Up: Your physician wants you to follow-up in: 1 year with Dr. Irish Lack.  You will receive a reminder letter in the mail two months in advance. If you don't receive a letter, please call our office to schedule the follow-up appointment.   Any Other Special Instructions Will Be Listed Below (If Applicable).     If you need a refill on your cardiac medications before your next appointment, please call your pharmacy.

## 2016-09-25 NOTE — Progress Notes (Signed)
Cardiology Office Note   Date:  09/25/2016   ID:  Roy [name], DOB 03/04/1961, MRN [id number]  PCP:  Vidal Schwalbe, MD    No chief complaint on file. Follow-up aortic valve replacement and coronary artery disease   Wt Readings from Last 3 Encounters:  09/25/16 223 lb 6.4 oz (101.3 kg)  12/19/15 212 lb (96.2 kg)  10/11/15 209 lb (94.8 kg)       History of Present Illness: Roy [name] is a 56 y.o. male   who has a family h/o CAD. He had an MI in 6/13 with a stent placed, with jaw pain and chest pain. In 12/13, he had knee surgery and 2 days later, he had an MI. He had a stent placed at that time. 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 He feels that the valve was damaged during the cath and wanted a new cardiologist, so he saw me for the first time in 2014. He feels better now and had a tissue valve due to not wanting to take coumadin.  I have not seen him in a couple of years. He returns and he has been doing well. No issues with chest discomfort, shortness of breath. His job is very physically strenuous. He works for [employer]. No trouble keeping up with his job responsibilities. He does not do any exercise outside of that. He denies any bleeding issues except for some bruising when he bumps into something.    Past Medical History:  Diagnosis Date  . Aortic insufficiency    a. 07/2012 s/p AVR: #25 Magnaease Bioprosthetic Valve (performed @ time of CABG).  . CAD (coronary artery disease)    a. 12/2011 NSTEMI/PCI: Stenting of the OM;  b. 06/2012 NSTEMI/PCI: LCX 100%->BMS;  c. 07/2012 Cath: dissectied LM & LCX with patent LCX/OM stents->CABG x 3: LIMA->LAD, VG->OM1->OM2.  . Diabetes mellitus    type II  . Diverticulitis   . Hyperlipidemia   . Hypertension   . Melanoma ([place])   . Renal disorder   . Secondary and unspecified malignant neoplasm of lymph nodes, site unspecified   . Shortness of breath     Past Surgical History:    Procedure Laterality Date  . AORTIC VALVE REPLACEMENT  08/02/2012   Procedure: AORTIC VALVE REPLACEMENT (AVR);  Surgeon: Melrose Nakayama, MD;  Location: [place];  Service: Open Heart Surgery;  Laterality: N/A;  . CARDIAC CATHETERIZATION  12-22-11   with OM1 Promus stent 12/22/11  . CORONARY ANGIOGRAM  07/30/2012   Procedure: CORONARY ANGIOGRAM;  Surgeon: Minus Breeding, MD;  Location: [facility] CATH LAB;  Service: Cardiovascular;;  . CORONARY ARTERY BYPASS GRAFT  08/02/2012   Procedure: CORONARY ARTERY BYPASS GRAFTING (CABG);  Surgeon: Melrose Nakayama, MD;  Location: [place];  Service: Open Heart Surgery;  Laterality: N/A;  . ENDOVEIN HARVEST OF GREATER SAPHENOUS VEIN  08/02/2012   Procedure: ENDOVEIN HARVEST OF GREATER SAPHENOUS VEIN;  Surgeon: Melrose Nakayama, MD;  Location: [place];  Service: Open Heart Surgery;  Laterality: Left;  upper and lower leg  . EYE SURGERY    . INTRAOPERATIVE TRANSESOPHAGEAL ECHOCARDIOGRAM  08/02/2012   Procedure: INTRAOPERATIVE TRANSESOPHAGEAL ECHOCARDIOGRAM;  Surgeon: Melrose Nakayama, MD;  Location: [place];  Service: Open Heart Surgery;  Laterality: N/A;  . KERATOTOMIES     BILATERAL  . knee arthoscopy     LEFT KNEE  . KNEE ARTHROSCOPY  06/28/2012   Procedure: ARTHROSCOPY KNEE;  Surgeon: Vickey Huger, MD;  Location: [place];  Service: Orthopedics;  Laterality: Left;  . knee surgery     left  . ROTATOR CUFF REPAIR     RIGHT  . SEPTOPLASTY    . TEE WITHOUT CARDIOVERSION  07/28/2012   Procedure: TRANSESOPHAGEAL ECHOCARDIOGRAM (TEE);  Surgeon: Thayer Headings, MD;  Location: [place];  Service: Cardiovascular;  Laterality: N/A;  . TONSILLECTOMY    . ULNAR TUNNEL RELEASE     RIGHT     Current Outpatient Prescriptions  Medication Sig Dispense Refill  . amoxicillin (AMOXIL) 500 MG capsule Take 500 mg by mouth. Reported on 12/19/2015  0  . aspirin 325 MG tablet Take 325 mg by mouth daily.    . cyclobenzaprine (FLEXERIL) 10 MG tablet Take 1 tablet (10 mg total)  by mouth 3 (three) times daily as needed for muscle spasms. 90 tablet 10  . finasteride (PROPECIA) 1 MG tablet Take 1 mg by mouth daily.  4  . losartan (COZAAR) 25 MG tablet Take 25 mg by mouth daily.    . pioglitazone (ACTOS) 30 MG tablet Take 30 mg by mouth daily.    . rosuvastatin (CRESTOR) 20 MG tablet TAKE 1 TABLET (20 MG TOTAL) BY MOUTH 2 (TWO) TIMES A WEEK. 15 tablet 6  . sitaGLIPtan-metformin (JANUMET) 50-1000 MG per tablet Take 1 tablet by mouth daily.     . tamsulosin (FLOMAX) 0.4 MG CAPS capsule Take 1 capsule (0.4 mg total) by mouth daily. 30 capsule 0   No current facility-administered medications for this visit.     Allergies:   Contrast media [iodinated diagnostic agents] and Other    Social History:  The patient  reports that he has never smoked. He has never used smokeless tobacco. He reports that he does not drink alcohol or use drugs.   Family History:  The patient's family history includes Coronary artery disease in his mother; Diabetes in his paternal grandmother; Heart attack in his paternal grandfather; Heart disease in his father; Hyperlipidemia in his brother; Lung cancer in his mother; Migraines in his father.    ROS:  Please see the history of present illness.   Otherwise, review of systems are positive for easy bruising.   All other systems are reviewed and negative.    PHYSICAL EXAM: VS:  BP 134/84   Pulse 78   Ht 5\' 9"  (1.753 m)   Wt 223 lb 6.4 oz (101.3 kg)   BMI 32.99 kg/m  , BMI Body mass index is 32.99 kg/m. GEN: Well nourished, well developed, in no acute distress  HEENT: normal  Neck: no JVD, carotid bruits, or masses Cardiac: RRR; no murmurs, rubs, or gallops,no edema  Respiratory:  clear to auscultation bilaterally, normal work of breathing GI: soft, nontender, nondistended, + BS MS: no deformity or atrophy  Skin: warm and dry, no rash Neuro:  Strength and sensation are intact Psych: euthymic mood, full affect   EKG:   The ekg ordered  today demonstrates normal sinus rhythm, nonspecific ST segment changes   Recent Labs: No results found for requested labs within last 8760 hours.   Lipid Panel    Component Value Date/Time   CHOL 157 07/31/2012 0319   CHOL 105 07/01/2012 0019   TRIG 39 07/31/2012 0319   TRIG 47 07/01/2012 0019   HDL 54 07/31/2012 0319   HDL 35 (L) 07/01/2012 0019   CHOLHDL 2.9 07/31/2012 0319   VLDL 8 07/31/2012 0319   VLDL 9 07/01/2012 0019  [place] 95 07/31/2012 0319   LDLCALC 61 07/01/2012 0019     Other studies Reviewed: Additional studies/ records that were reviewed today with results demonstrating: Operative report reviewed. Bioprosthetic valve..   ASSESSMENT AND PLAN:  1. Status post aortic valve replacement: I stressed the importance of SBE prophylaxis. Prescription called in for amoxicillin to be used a dental appointments. By exam, valve appears to be functioning well.  2. Coronary artery disease: No angina. Continue aggressive secondary prevention. We'll have to find his most recent cholesterol readings. We'll have to titrate his Crestor dose frequency based on these readings. Ultimately, he may need daily Crestor.  Prescription refilled to treat hyperlipidemia. 3. Decrease aspirin to 81 mg daily. 4. Diabetes: Continue aggressive treatment. Careful diet with regular exercise. Followed by his primary care doctor.   Current medicines are reviewed at length with the patient today.  The patient concerns regarding his medicines were addressed.  The following changes have been made:  No change  Labs/ tests ordered today include:  No orders of the defined types were placed in this encounter.   Recommend 150 minutes/week of aerobic exercise Low fat, low carb, high fiber diet recommended  Disposition:   FU in 1 year   Signed, Larae Grooms, MD  09/25/2016 12:12 PM    [address], [place], [place]  [address] Phone: [phone]; Fax: [phone]

## 2016-11-12 ENCOUNTER — Telehealth: Payer: Self-pay | Admitting: Interventional Cardiology

## 2016-11-12 NOTE — Telephone Encounter (Signed)
New Message:   Please call,question about his Rosuvastatin.

## 2016-11-12 NOTE — Telephone Encounter (Signed)
Patient calling and states that since starting the rosuvastatin that he has been experiencing a lot of dizziness and pain in his legs. Patient is suppose to be taking 20 mg daily but states that he has only been taking it every other day. He states that even cutting the medication back to every other day that he is still having these problems. 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 Patient wanting to see if he can switch to something else.

## 2016-11-13 NOTE — Telephone Encounter (Signed)
Would see if pt is willing to try Crestor 5mg  daily. If he prefers to take a different medication, would recommend pravastatin 20mg  daily due to better tolerability (pt has previously taken Lipitor 20mg , Crestor 10mg  daily, 20mg  daily, and 20mg  2x per week). Will likely need addition of Zetia or PCSK9i in the future. If pt would like to discuss in person, I am more than happy to see him in lipid clinic as well.

## 2016-11-13 NOTE — Telephone Encounter (Signed)
Left message for patient to call back  

## 2016-11-18 MED ORDER — ROSUVASTATIN CALCIUM 5 MG PO TABS: 5.0000 mg | ORAL_TABLET | Freq: Every day | ORAL | 3 refills | Status: DC

## 2016-11-18 NOTE — Telephone Encounter (Signed)
Patient calling back. Patient made aware of [place], [facility] recommendations. Patient states that he would like to try Crestor 5 mg daily. Rx sent to patient's preferred pharmacy. Patient made aware that Jinny Blossom is happy to see him in the lipid clinic if he continues to have problems. Patient verbalized understanding and appreciated the call.

## 2016-11-18 NOTE — Telephone Encounter (Signed)
Called and left message for patient to call back.

## 2016-12-22 ENCOUNTER — Encounter (HOSPITAL_COMMUNITY): Payer: Self-pay | Admitting: Emergency Medicine

## 2016-12-22 ENCOUNTER — Emergency Department (HOSPITAL_COMMUNITY): Payer: BLUE CROSS/BLUE SHIELD

## 2016-12-22 DIAGNOSIS — Z7984 Long term (current) use of oral hypoglycemic drugs: Secondary | ICD-10-CM | POA: Insufficient documentation

## 2016-12-22 DIAGNOSIS — Z951 Presence of aortocoronary bypass graft: Secondary | ICD-10-CM | POA: Diagnosis not present

## 2016-12-22 DIAGNOSIS — E785 Hyperlipidemia, unspecified: Secondary | ICD-10-CM | POA: Diagnosis not present

## 2016-12-22 DIAGNOSIS — Z953 Presence of xenogenic heart valve: Secondary | ICD-10-CM | POA: Insufficient documentation

## 2016-12-22 DIAGNOSIS — R200 Anesthesia of skin: Secondary | ICD-10-CM | POA: Diagnosis not present

## 2016-12-22 DIAGNOSIS — R479 Unspecified speech disturbances: Secondary | ICD-10-CM | POA: Diagnosis present

## 2016-12-22 DIAGNOSIS — Z91041 Radiographic dye allergy status: Secondary | ICD-10-CM | POA: Insufficient documentation

## 2016-12-22 DIAGNOSIS — E1151 Type 2 diabetes mellitus with diabetic peripheral angiopathy without gangrene: Secondary | ICD-10-CM | POA: Diagnosis not present

## 2016-12-22 DIAGNOSIS — I252 Old myocardial infarction: Secondary | ICD-10-CM | POA: Insufficient documentation

## 2016-12-22 DIAGNOSIS — I2510 Atherosclerotic heart disease of native coronary artery without angina pectoris: Secondary | ICD-10-CM | POA: Insufficient documentation

## 2016-12-22 DIAGNOSIS — Z955 Presence of coronary angioplasty implant and graft: Secondary | ICD-10-CM | POA: Diagnosis not present

## 2016-12-22 DIAGNOSIS — I10 Essential (primary) hypertension: Secondary | ICD-10-CM | POA: Insufficient documentation

## 2016-12-22 DIAGNOSIS — Z85820 Personal history of malignant melanoma of skin: Secondary | ICD-10-CM | POA: Insufficient documentation

## 2016-12-22 DIAGNOSIS — G43109 Migraine with aura, not intractable, without status migrainosus: Secondary | ICD-10-CM | POA: Diagnosis not present

## 2016-12-22 DIAGNOSIS — R42 Dizziness and giddiness: Secondary | ICD-10-CM | POA: Insufficient documentation

## 2016-12-22 DIAGNOSIS — Z7983 Long term (current) use of bisphosphonates: Secondary | ICD-10-CM | POA: Insufficient documentation

## 2016-12-22 DIAGNOSIS — Z79899 Other long term (current) drug therapy: Secondary | ICD-10-CM | POA: Diagnosis not present

## 2016-12-22 DIAGNOSIS — Z888 Allergy status to other drugs, medicaments and biological substances status: Secondary | ICD-10-CM | POA: Insufficient documentation

## 2016-12-22 DIAGNOSIS — R4701 Aphasia: Secondary | ICD-10-CM | POA: Diagnosis not present

## 2016-12-22 DIAGNOSIS — Z7982 Long term (current) use of aspirin: Secondary | ICD-10-CM | POA: Diagnosis not present

## 2016-12-22 DIAGNOSIS — G458 Other transient cerebral ischemic attacks and related syndromes: Secondary | ICD-10-CM | POA: Diagnosis present

## 2016-12-22 LAB — I-STAT CHEM 8, ED
BUN: 20 mg/dL (ref 6–20)
CALCIUM ION: 1.25 mmol/L (ref 1.15–1.40)
Chloride: 102 mmol/L (ref 101–111)
Creatinine, Ser: 1.2 mg/dL (ref 0.61–1.24)
Glucose, Bld: 286 mg/dL — ABNORMAL HIGH (ref 65–99)
HCT: 37 % — ABNORMAL LOW (ref 39.0–52.0)
Hemoglobin: 12.6 g/dL — ABNORMAL LOW (ref 13.0–17.0)
Potassium: 4.3 mmol/L (ref 3.5–5.1)
SODIUM: 138 mmol/L (ref 135–145)
TCO2: 24 mmol/L (ref 0–100)

## 2016-12-22 LAB — APTT: aPTT: 32 seconds (ref 24–36)

## 2016-12-22 LAB — COMPREHENSIVE METABOLIC PANEL
ALK PHOS: 57 U/L (ref 38–126)
ALT: 24 U/L (ref 17–63)
ANION GAP: 10 (ref 5–15)
AST: 25 U/L (ref 15–41)
Albumin: 4.1 g/dL (ref 3.5–5.0)
BUN: 16 mg/dL (ref 6–20)
CALCIUM: 9.2 mg/dL (ref 8.9–10.3)
CHLORIDE: 103 mmol/L (ref 101–111)
CO2: 22 mmol/L (ref 22–32)
Creatinine, Ser: 1.27 mg/dL — ABNORMAL HIGH (ref 0.61–1.24)
GFR calc non Af Amer: >60 mL/min (ref >60)
Glucose, Bld: 272 mg/dL — ABNORMAL HIGH (ref 65–99)
POTASSIUM: 4.4 mmol/L (ref 3.5–5.1)
SODIUM: 135 mmol/L (ref 135–145)
Total Bilirubin: 0.8 mg/dL (ref 0.3–1.2)
Total Protein: 6.9 g/dL (ref 6.5–8.1)

## 2016-12-22 LAB — CBC
HCT: 37.8 % — ABNORMAL LOW (ref 39.0–52.0)
Hemoglobin: 12.7 g/dL — ABNORMAL LOW (ref 13.0–17.0)
MCH: 29.5 pg (ref 26.0–34.0)
MCHC: 33.6 g/dL (ref 30.0–36.0)
MCV: 87.9 fL (ref 78.0–100.0)
PLATELETS: 123 10*3/uL — AB (ref 150–400)
RBC: 4.3 MIL/uL (ref 4.22–5.81)
RDW: 13.6 % (ref 11.5–15.5)
WBC: 3.8 10*3/uL — ABNORMAL LOW (ref 4.0–10.5)

## 2016-12-22 LAB — PROTIME-INR
INR: 1.02
PROTHROMBIN TIME: 13.4 s (ref 11.4–15.2)

## 2016-12-22 LAB — I-STAT TROPONIN, ED: Troponin i, poc: 0 ng/mL (ref 0.00–0.08)

## 2016-12-22 LAB — DIFFERENTIAL
BASOS PCT: 0 %
Basophils Absolute: 0 10*3/uL (ref 0.0–0.1)
EOS ABS: 0.2 10*3/uL (ref 0.0–0.7)
EOS PCT: 4 %
Lymphocytes Relative: 41 %
Lymphs Abs: 1.6 10*3/uL (ref 0.7–4.0)
MONO ABS: 0.3 10*3/uL (ref 0.1–1.0)
Monocytes Relative: 8 %
Neutro Abs: 1.8 10*3/uL (ref 1.7–7.7)
Neutrophils Relative %: 47 %

## 2016-12-22 LAB — CBG MONITORING, ED: GLUCOSE-CAPILLARY: 292 mg/dL — AB (ref 65–99)

## 2016-12-22 NOTE — ED Triage Notes (Signed)
Pt complains of having a headache that started 1600.  Pt took 2 arthritis tylenol which relieved headache.  At 1800 pt began to feel dizzy and confused.  Pt at that time left work and drove home.

## 2016-12-23 ENCOUNTER — Observation Stay (HOSPITAL_COMMUNITY): Payer: BLUE CROSS/BLUE SHIELD

## 2016-12-23 ENCOUNTER — Observation Stay (HOSPITAL_COMMUNITY)
Admission: EM | Admit: 2016-12-23 | Discharge: 2016-12-23 | Disposition: A | Discharge status: Home / Self Care | Payer: BLUE CROSS/BLUE SHIELD | Attending: Internal Medicine | Admitting: Internal Medicine

## 2016-12-23 ENCOUNTER — Observation Stay (HOSPITAL_BASED_OUTPATIENT_CLINIC_OR_DEPARTMENT_OTHER): Payer: BLUE CROSS/BLUE SHIELD

## 2016-12-23 ENCOUNTER — Encounter (HOSPITAL_COMMUNITY): Payer: Self-pay

## 2016-12-23 DIAGNOSIS — I351 Nonrheumatic aortic (valve) insufficiency: Secondary | ICD-10-CM

## 2016-12-23 DIAGNOSIS — Z952 Presence of prosthetic heart valve: Secondary | ICD-10-CM

## 2016-12-23 DIAGNOSIS — N183 Chronic kidney disease, stage 3 (moderate): Secondary | ICD-10-CM | POA: Diagnosis not present

## 2016-12-23 DIAGNOSIS — G458 Other transient cerebral ischemic attacks and related syndromes: Secondary | ICD-10-CM

## 2016-12-23 DIAGNOSIS — I5030 Unspecified diastolic (congestive) heart failure: Secondary | ICD-10-CM

## 2016-12-23 DIAGNOSIS — I10 Essential (primary) hypertension: Secondary | ICD-10-CM

## 2016-12-23 DIAGNOSIS — G459 Transient cerebral ischemic attack, unspecified: Secondary | ICD-10-CM | POA: Diagnosis not present

## 2016-12-23 DIAGNOSIS — E1122 Type 2 diabetes mellitus with diabetic chronic kidney disease: Secondary | ICD-10-CM

## 2016-12-23 DIAGNOSIS — G43109 Migraine with aura, not intractable, without status migrainosus: Secondary | ICD-10-CM

## 2016-12-23 DIAGNOSIS — E119 Type 2 diabetes mellitus without complications: Secondary | ICD-10-CM

## 2016-12-23 DIAGNOSIS — R479 Unspecified speech disturbances: Secondary | ICD-10-CM

## 2016-12-23 DIAGNOSIS — Z9861 Coronary angioplasty status: Secondary | ICD-10-CM

## 2016-12-23 DIAGNOSIS — I2510 Atherosclerotic heart disease of native coronary artery without angina pectoris: Secondary | ICD-10-CM

## 2016-12-23 DIAGNOSIS — I2583 Coronary atherosclerosis due to lipid rich plaque: Secondary | ICD-10-CM

## 2016-12-23 LAB — URINALYSIS, ROUTINE W REFLEX MICROSCOPIC
BILIRUBIN URINE: NEGATIVE
Glucose, UA: >=500 mg/dL — AB
HGB URINE DIPSTICK: NEGATIVE
KETONES UR: NEGATIVE mg/dL
LEUKOCYTES UA: NEGATIVE
NITRITE: NEGATIVE
Protein, ur: NEGATIVE mg/dL
Specific Gravity, Urine: 1.023 (ref 1.005–1.030)
WBC UA: NONE SEEN WBC/hpf (ref 0–5)
pH: 5 (ref 5.0–8.0)

## 2016-12-23 LAB — RAPID URINE DRUG SCREEN, HOSP PERFORMED
Amphetamines: NOT DETECTED
Barbiturates: NOT DETECTED
Benzodiazepines: NOT DETECTED
COCAINE: NOT DETECTED
OPIATES: NOT DETECTED
TETRAHYDROCANNABINOL: NOT DETECTED

## 2016-12-23 LAB — ECHOCARDIOGRAM COMPLETE
Height: 69 in
Weight: 3343.94 oz

## 2016-12-23 LAB — TSH: TSH: 3.143 u[IU]/mL (ref 0.350–4.500)

## 2016-12-23 LAB — GLUCOSE, CAPILLARY
GLUCOSE-CAPILLARY: 227 mg/dL — AB (ref 65–99)
Glucose-Capillary: 169 mg/dL — ABNORMAL HIGH (ref 65–99)

## 2016-12-23 LAB — VITAMIN B12: VITAMIN B 12: 419 pg/mL (ref 180–914)

## 2016-12-23 LAB — ETHANOL: Alcohol, Ethyl (B): <5 mg/dL (ref <5)

## 2016-12-23 MED ORDER — INSULIN ASPART 100 UNIT/ML ~~LOC~~ SOLN: 0.0000 [IU] | Freq: Every day | SUBCUTANEOUS | Status: DC

## 2016-12-23 MED ORDER — SODIUM CHLORIDE 0.9% FLUSH
3.0000 mL | Freq: Two times a day (BID) | INTRAVENOUS | Status: DC
  Administered 2016-12-23: 3 mL via INTRAVENOUS

## 2016-12-23 MED ORDER — LOSARTAN POTASSIUM 25 MG PO TABS
25.0000 mg | ORAL_TABLET | Freq: Every day | ORAL | Status: DC
  Filled 2016-12-23 (×2): qty 1

## 2016-12-23 MED ORDER — DIPHENHYDRAMINE HCL 50 MG/ML IJ SOLN
INTRAMUSCULAR | Status: AC
  Filled 2016-12-23: qty 1

## 2016-12-23 MED ORDER — ASPIRIN EC 81 MG PO TBEC
81.0000 mg | DELAYED_RELEASE_TABLET | Freq: Every day | ORAL | Status: DC
  Administered 2016-12-23: 81 mg via ORAL
  Filled 2016-12-23 (×2): qty 1

## 2016-12-23 MED ORDER — INSULIN ASPART 100 UNIT/ML ~~LOC~~ SOLN: 0.0000 [IU] | Freq: Three times a day (TID) | SUBCUTANEOUS | Status: DC

## 2016-12-23 MED ORDER — PERFLUTREN LIPID MICROSPHERE
1.0000 mL | INTRAVENOUS | Status: AC | PRN
  Administered 2016-12-23: 3 mL via INTRAVENOUS
  Filled 2016-12-23: qty 10

## 2016-12-23 MED ORDER — STROKE: EARLY STAGES OF RECOVERY BOOK
Freq: Once | Status: AC
  Administered 2016-12-23: 04:00:00
  Filled 2016-12-23: qty 1

## 2016-12-23 MED ORDER — ENOXAPARIN SODIUM 40 MG/0.4ML ~~LOC~~ SOLN: 40.0000 mg | SUBCUTANEOUS | Status: DC

## 2016-12-23 MED ORDER — DIPHENHYDRAMINE HCL 50 MG/ML IJ SOLN
25.0000 mg | Freq: Once | INTRAMUSCULAR | Status: AC
  Administered 2016-12-23: 25 mg via INTRAVENOUS

## 2016-12-23 MED ORDER — FINASTERIDE 1 MG PO TABS: 0.2500 mg | ORAL_TABLET | Freq: Every day | ORAL | Status: DC

## 2016-12-23 MED ORDER — GADOBENATE DIMEGLUMINE 529 MG/ML IV SOLN
20.0000 mL | Freq: Once | INTRAVENOUS | Status: AC
  Administered 2016-12-23: 19 mL via INTRAVENOUS

## 2016-12-23 MED ORDER — ROSUVASTATIN CALCIUM 5 MG PO TABS
5.0000 mg | ORAL_TABLET | Freq: Every day | ORAL | Status: DC
  Administered 2016-12-23: 5 mg via ORAL
  Filled 2016-12-23 (×2): qty 1

## 2016-12-23 NOTE — Care Management Note (Signed)
Case Management Note  Patient Details  Name: Roy [name] MRN: [id number] Date of Birth: 12-11-1960  Subjective/Objective:                    Action/Plan: Pt discharging home with self care. Pt has insurance, PCP and transportation home. No further needs per CM.   Expected Discharge Date:  12/23/16               Expected Discharge Plan:  Home/Self Care  In-House Referral:     Discharge planning Services     Post Acute Care Choice:    Choice offered to:     DME Arranged:    DME Agency:     HH Arranged:    HH Agency:     Status of Service:  Completed, signed off  If discussed at [employer] of Stay Meetings, dates discussed:    Additional Comments:  Pollie Friar, RN 12/23/2016, 3:07 PM

## 2016-12-23 NOTE — ED Provider Notes (Signed)
[name] DEPT Provider Note   CSN: [id number] Arrival date & time: 12/22/16  2108   By signing my name below, I, Eunice Blase, attest that this documentation has been prepared under the direction and in the presence of Ripley Fraise, MD. Electronically signed, Eunice Blase, ED Scribe. 12/23/16. 1:45 AM.  History   Chief Complaint Chief Complaint  Patient presents with  . Dizziness   The history is provided by the patient and the spouse. No language interpreter was used.  Dizziness  Quality:  Lightheadedness and imbalance Severity:  Moderate Onset quality:  Sudden Timing:  Intermittent Progression:  Waxing and waning Chronicity:  New Relieved by:  Change in position Associated symptoms: headaches   Associated symptoms: no blood in stool, no chest pain, no hearing loss, no nausea, no shortness of breath, no syncope, no tinnitus, no vision changes, no vomiting and no weakness   Risk factors: heart disease     Roy [name] is a 56 y.o. male with h/o migraines, CAD, heart attacks and multiple open heart procedures presenting to the Emergency Department with a chief complaint of lightheadedness onset at work yesterday. Intermittent hand numbness, headache around the L eye, loss of balance and confusion also noted.  Pt also c/o longstanding, 3/10 constant posterior L neck pain that he believes to be related. He states all of his symptoms have improved since being in [facility] ED. Spouse states the pt was incoherent in speech at 2 separate times yesterday.  No garbled or slurred speech  No weakness, abdominal pain, syncope or vision loss noted. No other complaints at this time.  Past Medical History:  Diagnosis Date  . Aortic insufficiency    a. 07/2012 s/p AVR: #25 Magnaease Bioprosthetic Valve (performed @ time of CABG).  . CAD (coronary artery disease)    a. 12/2011 NSTEMI/PCI: Stenting of the OM;  b. 06/2012 NSTEMI/PCI: LCX 100%->BMS;  c. 07/2012 Cath: dissectied LM & LCX with  patent LCX/OM stents->CABG x 3: LIMA->LAD, VG->OM1->OM2.  . Diabetes mellitus    type II  . Diverticulitis   . Hyperlipidemia   . Hypertension   . Melanoma ([place])   . Renal disorder   . Secondary and unspecified malignant neoplasm of lymph nodes, site unspecified   . Shortness of breath     Patient Active Problem List   Diagnosis Date Noted  . Old myocardial infarction 01/06/2014  . Long term (current) use of anticoagulants 08/25/2012  . Aortic insufficiency 08/20/2012  . S/P AVR (aortic valve replacement) 08/20/2012  . Dyspnea 07/26/2012  . Cough 07/15/2012  . HTN (hypertension) 01/08/2012  . Chest pain 01/08/2012  . CAD S/P percutaneous coronary angioplasty 01/08/2012  . Diabetes mellitus ([place]) 01/08/2012  . Hyperlipidemia 01/08/2012    Past Surgical History:  Procedure Laterality Date  . AORTIC VALVE REPLACEMENT  08/02/2012   Procedure: AORTIC VALVE REPLACEMENT (AVR);  Surgeon: Melrose Nakayama, MD;  Location: [place];  Service: Open Heart Surgery;  Laterality: N/A;  . CARDIAC CATHETERIZATION  12-22-11   with OM1 Promus stent 12/22/11  . CORONARY ANGIOGRAM  07/30/2012   Procedure: CORONARY ANGIOGRAM;  Surgeon: Minus Breeding, MD;  Location: [facility] CATH LAB;  Service: Cardiovascular;;  . CORONARY ARTERY BYPASS GRAFT  08/02/2012   Procedure: CORONARY ARTERY BYPASS GRAFTING (CABG);  Surgeon: Melrose Nakayama, MD;  Location: [place];  Service: Open Heart Surgery;  Laterality: N/A;  . ENDOVEIN HARVEST OF GREATER SAPHENOUS VEIN  08/02/2012   Procedure: ENDOVEIN HARVEST OF GREATER SAPHENOUS VEIN;  Surgeon: Melrose Nakayama, MD;  Location: [place];  Service: Open Heart Surgery;  Laterality: Left;  upper and lower leg  . EYE SURGERY    . INTRAOPERATIVE TRANSESOPHAGEAL ECHOCARDIOGRAM  08/02/2012   Procedure: INTRAOPERATIVE TRANSESOPHAGEAL ECHOCARDIOGRAM;  Surgeon: Melrose Nakayama, MD;  Location: [place];  Service: Open Heart Surgery;  Laterality: N/A;  . KERATOTOMIES     BILATERAL  .  knee arthoscopy     LEFT KNEE  . KNEE ARTHROSCOPY  06/28/2012   Procedure: ARTHROSCOPY KNEE;  Surgeon: Vickey Huger, MD;  Location: [place];  Service: Orthopedics;  Laterality: Left;  . knee surgery     left  . ROTATOR CUFF REPAIR     RIGHT  . SEPTOPLASTY    . TEE WITHOUT CARDIOVERSION  07/28/2012   Procedure: TRANSESOPHAGEAL ECHOCARDIOGRAM (TEE);  Surgeon: Thayer Headings, MD;  Location: [place];  Service: Cardiovascular;  Laterality: N/A;  . TONSILLECTOMY    . ULNAR TUNNEL RELEASE     RIGHT       Home Medications    Prior to Admission medications   Medication Sig Start Date End Date Taking? Authorizing Provider  amoxicillin (AMOXIL) 500 MG capsule Take 4 tablets (2,000mg ) by mouth one hour prior to dental work. 09/25/16   Jettie Booze, MD  aspirin 325 MG tablet Take 325 mg by mouth daily.    [provider]  cyclobenzaprine (FLEXERIL) 10 MG tablet Take 1 tablet (10 mg total) by mouth 3 (three) times daily as needed for muscle spasms. 12/19/15   Melvenia Beam, MD  finasteride (PROPECIA) 1 MG tablet Take 1 mg by mouth daily. 11/21/15   [provider]  losartan (COZAAR) 25 MG tablet Take 25 mg by mouth daily.    [provider]  pioglitazone (ACTOS) 30 MG tablet Take 30 mg by mouth daily.    [provider]  rosuvastatin (CRESTOR) 5 MG tablet Take 1 tablet (5 mg total) by mouth daily. 11/18/16 02/16/17  Jettie Booze, MD  sitaGLIPtan-metformin (JANUMET) 50-1000 MG per tablet Take 1 tablet by mouth daily.     [provider]  tamsulosin (FLOMAX) 0.4 MG CAPS capsule Take 1 capsule (0.4 mg total) by mouth daily. 03/02/14   Evelina Bucy, MD    Family History Family History  Problem Relation Age of Onset  . Coronary artery disease Mother   . Lung cancer Mother   . Heart disease Father   . Migraines Father   . Heart attack Paternal Grandfather   . Diabetes Paternal Grandmother   . Hyperlipidemia Brother   . Hyperlipidemia  Unknown        father's side  . Melanoma Unknown        fh    Social History Social History  Substance Use Topics  . Smoking status: Never Smoker  . Smokeless tobacco: Never Used  . Alcohol use No     Allergies   Contrast media [iodinated diagnostic agents] and Other   Review of Systems Review of Systems  HENT: Negative for hearing loss, tinnitus and voice change.   Eyes: Negative for visual disturbance.  Respiratory: Negative for shortness of breath.   Cardiovascular: Negative for chest pain and syncope.  Gastrointestinal: Negative for abdominal pain, blood in stool, nausea and vomiting.  Skin: Negative for wound.  Neurological: Positive for dizziness, numbness and headaches. Negative for speech difficulty and weakness.  Psychiatric/Behavioral: Positive for confusion.  All other systems reviewed and are negative.    Physical  Exam Updated Vital Signs BP 139/86 (BP Location: Right Arm)   Pulse 74   Temp 97.8 F (36.6 C) (Oral)   Resp 18   Ht 5\' 9"  (1.753 m)   Wt 202 lb (91.6 kg)   SpO2 97%   BMI 29.83 kg/m   Physical Exam CONSTITUTIONAL: Well developed/well nourished HEAD: Normocephalic/atraumatic EYES: EOMI/PERRL, no nystagmus, no ptosis ENMT: Mucous membranes moist NECK: supple no meningeal signs, no bruits CV: S1/S2 noted, no murmurs/rubs/gallops noted LUNGS: Lungs are clear to auscultation bilaterally, no apparent distress ABDOMEN: soft, nontender, no rebound or guarding GU:no cva tenderness NEURO:Awake/alert, face symmetric, no arm or leg drift is noted Equal 5/5 strength with shoulder abduction, elbow flex/extension, wrist flex/extension in upper extremities Equal 5/5 strength with hip flexion,knee flex/extension, foot dorsi/plantar flexion Cranial nerves 3/4/5/6/01/26/09/11/12 tested and intact Gait normal without ataxia No past pointing Sensation to light touch intact in all extremities EXTREMITIES: pulses normal, full ROM SKIN: warm, color  normal PSYCH: no abnormalities of mood noted   ED Treatments / Results  DIAGNOSTIC STUDIES: Oxygen Saturation is 97% on RA, NL by my interpretation.    COORDINATION OF CARE: 1:40 AM-Discussed next steps with pt. Pt verbalized understanding and is agreeable with the plan. Will prepare for possible admission.   Labs (all labs ordered are listed, but only abnormal results are displayed) Labs Reviewed  CBC - Abnormal; Notable for the following:       Result Value   WBC 3.8 (*)    Hemoglobin 12.7 (*)    HCT 37.8 (*)    Platelets 123 (*)    All other components within normal limits  COMPREHENSIVE METABOLIC PANEL - Abnormal; Notable for the following:    Glucose, Bld 272 (*)    Creatinine, Ser 1.27 (*)    All other components within normal limits  CBG MONITORING, ED - Abnormal; Notable for the following:    Glucose-Capillary 292 (*)    All other components within normal limits  I-STAT CHEM 8, ED - Abnormal; Notable for the following:    Glucose, Bld 286 (*)    Hemoglobin 12.6 (*)    HCT 37.0 (*)    All other components within normal limits  PROTIME-INR  APTT  DIFFERENTIAL  ETHANOL  URINALYSIS, ROUTINE W REFLEX MICROSCOPIC  RAPID URINE DRUG SCREEN, HOSP PERFORMED  I-STAT TROPOININ, ED  CBG MONITORING, ED    EKG  EKG Interpretation  Date/Time:  Monday December 22 2016 21:20:43 EDT Ventricular Rate:  84 PR Interval:  154 QRS Duration: 90 QT Interval:  362 QTC Calculation: 427 R Axis:   0 Text Interpretation:  Normal sinus rhythm Inferior infarct , age undetermined Abnormal ECG No significant change since last tracing Confirmed by Ripley Fraise [phone]) on 12/23/2016 1:30:27 AM       Radiology Ct Head Wo Contrast  Result Date: 12/22/2016 CLINICAL DATA:  Initial valuation for acute confusion, dizziness. EXAM: CT HEAD WITHOUT CONTRAST TECHNIQUE: Contiguous axial images were obtained from the base of the skull through the vertex without intravenous contrast. COMPARISON:   None. FINDINGS: Brain: Cerebral volume within normal limits for patient age. No evidence for acute intracranial hemorrhage. No findings to suggest acute large vessel territory infarct. No mass lesion, midline shift, or mass effect. Ventricles are normal in size without evidence for hydrocephalus. No extra-axial fluid collection identified. Vascular: No hyperdense vessel identified. Skull: Scalp soft tissues demonstrate no acute abnormality.Calvarium intact. Sinuses/Orbits: Globes and orbital soft tissues are within normal limits. Mild scattered mucosal thickening  within the ethmoidal air cells. Paranasal sinuses are otherwise clear. No mastoid effusion. IMPRESSION: Normal head CT.  No acute intracranial process identified. Electronically Signed   By: Jeannine Boga M.D.   On: 12/22/2016 23:59    Procedures Procedures (including critical care time)  Medications Ordered in ED Medications - No data to display   Initial Impression / Assessment and Plan / ED Course  I have reviewed the triage vital signs and the nursing notes.  Pertinent labs results that were available during my care of the patient were reviewed by me and considered in my medical decision making (see chart for details).     2:52 AM Pt with dizziness as well as what appears to be aphasia He is now at baseline D/w dr Leonel Ramsay, appreciate input Will admit for TIA workup D/w dr danford for admission tPA in stroke considered but not given due to: Onset over 3-4.5hours  Final Clinical Impressions(s) / ED Diagnoses   Final diagnoses:  Speech abnormality    New Prescriptions New Prescriptions   No medications on file  I personally performed the services described in this documentation, which was scribed in my presence. The recorded information has been reviewed and is accurate.        Ripley Fraise, MD 12/23/16 [phone]

## 2016-12-23 NOTE — Consult Note (Signed)
Neurology Consultation Reason for Consult: Aphasia Referring Physician: Christy Gentles, D  CC: Trouble with understanding  History is obtained from: Patient  HPI: Roy [name] is a 56 y.o. male with a history of diabetes, coronary artery disease, melanoma who presents with episode lasting about 3 hours of difficulty with speaking and understanding. He states that began to get a headache and then noticed that he was having difficulty with reading. He called his wife and realized that he couldn't understand her, that he can do her quite well. He apparently was speaking nonsense. He states that this gradually improved. He also notes transient bilateral hand numbness, but this did not seem to be temporally correlated to the other symptoms.  He states that he has gotten episodes similar to this several times and having his coronary artery bypass. No history of headaches prior to this. Typically, however, the symptoms are brief lasting about 10-15 minutes and he has visual changes well. Visual changes not present today, and the symptoms lasted much longer and therefore he sought care in the emergency department.   LKW: 4 PM tpa given?: no, out of window  ROS: A 14 point ROS was performed and is negative except as noted in the HPI.   Past Medical History:  Diagnosis Date  . Aortic insufficiency    a. 07/2012 s/p AVR: #25 Magnaease Bioprosthetic Valve (performed @ time of CABG).  . CAD (coronary artery disease)    a. 12/2011 NSTEMI/PCI: Stenting of the OM;  b. 06/2012 NSTEMI/PCI: LCX 100%->BMS;  c. 07/2012 Cath: dissectied LM & LCX with patent LCX/OM stents->CABG x 3: LIMA->LAD, VG->OM1->OM2.  . Diabetes mellitus    type II  . Diverticulitis   . Hyperlipidemia   . Hypertension   . Melanoma ([place])   . Renal disorder   . Secondary and unspecified malignant neoplasm of lymph nodes, site unspecified   . Shortness of breath      Family History  Problem Relation Age of Onset  . Coronary artery  disease Mother   . Lung cancer Mother   . Heart disease Father   . Migraines Father   . Heart attack Paternal Grandfather   . Diabetes Paternal Grandmother   . Hyperlipidemia Brother   . Hyperlipidemia Unknown        father's side  . Melanoma Unknown        fh     Social History:  reports that he has never smoked. He has never used smokeless tobacco. He reports that he does not drink alcohol or use drugs.   Exam: Current vital signs: BP 135/85   Pulse 72   Temp 97.8 F (36.6 C) (Oral)   Resp 14   Ht 5\' 9"  (1.753 m)   Wt 91.6 kg (202 lb)   SpO2 100%   BMI 29.83 kg/m  Vital signs in last 24 hours: Temp:  [97.8 F (36.6 C)] 97.8 F (36.6 C) (06/04 2116) Pulse Rate:  [72-85] 72 (06/05 0230) Resp:  [14-18] 14 (06/05 0230) BP: (124-139)/(85-91) 135/85 (06/05 0230) SpO2:  [96 %-100 %] 100 % (06/05 0230) Weight:  [91.6 kg (202 lb)] 91.6 kg (202 lb) (06/04 2116)   Physical Exam  Constitutional: Appears well-developed and well-nourished.  Psych: Affect appropriate to situation Eyes: No scleral injection HENT: No OP obstrucion Head: Normocephalic.  Cardiovascular: Normal rate and regular rhythm.  Respiratory: Effort normal and breath sounds normal to anterior ascultation GI: Soft.  No distension. There is no tenderness.  Skin: WDI  Neuro:  Mental Status: Patient is awake, alert, oriented to person, place, month, year, and situation. Patient is able to give a clear and coherent history. No signs of aphasia or neglect Cranial Nerves: II: Visual Fields are full. Pupils are equal, round, and reactive to light.   III,IV, VI: EOMI without ptosis or diploplia.  V: Facial sensation is symmetric to temperature VII: Facial movement is symmetric.  VIII: hearing is intact to voice X: Uvula elevates symmetrically XI: Shoulder shrug is symmetric. XII: tongue is midline without atrophy or fasciculations.  Motor: Tone is normal. Bulk is normal. 5/5 strength was present in all  four extremities.  Sensory: Sensation is symmetric to light touch and temperature in the arms and legs. Cerebellar: FNF and HKS are intact bilaterally   I have reviewed labs in epic and the results pertinent to this consultation are: CMP-unremarkable  I have reviewed the images obtained: CT head-unremarkable  Impression: 56 year old male with transient aphasia. Though migraine could be a possibility, with his known history of left carotid disease I am concerned about possible hypoperfusion TIAs. I would favor admission for observation given the length of time that this occurred, I would favor further workup to lower  risk of stroke in the future.  Recommendations: 1. HgbA1c, fasting lipid panel 2. MRI, MRA  of the brain without contrast 3. Frequent neuro checks 4. Echocardiogram 5. MR angiogram of the neck 6. Prophylactic therapy-Antiplatelet med: Aspirin - dose 325mg  PO or 300mg  PR 7. Risk factor modification 8. Telemetry monitoring 9. PT consult, OT consult, Speech consult 10. please page stroke NP  Or  PA  Or MD  from 8am -4 pm as this patient will be followed by the stroke team at this point.   You can look them up on [email or web address]      Roland Rack, MD [facility] [phone]  If 7pm- 7am, please page neurology on call as listed in [place].

## 2016-12-23 NOTE — Progress Notes (Signed)
STROKE TEAM PROGRESS NOTE   HISTORY OF PRESENT ILLNESS (per record) Roy [name] is a 56 y.o. male with a history of diabetes, coronary artery disease, melanoma who presents with episode lasting about 3 hours of difficulty with speaking and understanding. He states that began to get a headache and then noticed that he was having difficulty with reading. He called his Roy [name] and realized that he couldn't understand her, that he can do her quite well. He apparently was speaking nonsense. He states that this gradually improved. He also notes transient bilateral hand numbness, but this did not seem to be temporally correlated to the other symptoms. He states that he has gotten episodes similar to this several times and having his coronary artery bypass. No history of headaches prior to this. Typically, however, the symptoms are brief lasting about 10-15 minutes and he has visual changes well. Visual changes not present today, and the symptoms lasted much longer and therefore he sought care in the emergency department. He was LKW at 4 PM 12/22/2016. Patient was not administered IV t-PA secondary to being out of the window. He was admitted for further evaluation and treatment.   SUBJECTIVE (INTERVAL HISTORY) Roy [name] is at bedside. Pt recounted HPI with me. Pt has had complicated migraine since his heart surgery. HA preceded with visual disturbance (spots in both eyes) with speech difficulty lasting 45min. HA is at left side above eye and at back of head on the left. Average once a month, taking Excedrin most of the time. He is using imitrex in the past but it makes him really sick. This time he had similar episode as before but just no visual disturbance. He still has speech difficulty but also has b/l hand numbness. Lasting for one hour before HA started. The HA lasted about 56min and resolved.    OBJECTIVE Temp:  [97.6 F (36.4 C)-98 F (36.7 C)] 98 F (36.7 C) (06/05 0740) Pulse Rate:  [64-85] 78 (06/05  0740) Cardiac Rhythm: Normal sinus rhythm (06/05 0700) Resp:  [14-18] 14 (06/05 0740) BP: (124-140)/(76-91) 128/76 (06/05 0740) SpO2:  [96 %-100 %] 99 % (06/05 0740) Weight:  [91.6 kg (202 lb)-94.8 kg (208 lb 15.9 oz)] 94.8 kg (208 lb 15.9 oz) (06/05 0330)  CBC:  Recent Labs Lab 12/22/16 2137 12/22/16 2206  WBC 3.8*  --   NEUTROABS 1.8  --   HGB 12.7* 12.6*  HCT 37.8* 37.0*  MCV 87.9  --   PLT 123*  --     Basic Metabolic Panel:  Recent Labs Lab 12/22/16 2137 12/22/16 2206  NA 135 138  K 4.4 4.3  CL 103 102  CO2 22  --   GLUCOSE 272* 286*  BUN 16 20  CREATININE 1.27* 1.20  CALCIUM 9.2  --     Lipid Panel:     Component Value Date/Time   CHOL 157 07/31/2012 0319   CHOL 105 07/01/2012 0019   TRIG 39 07/31/2012 0319   TRIG 47 07/01/2012 0019   HDL 54 07/31/2012 0319   HDL 35 (L) 07/01/2012 0019   CHOLHDL 2.9 07/31/2012 0319   VLDL 8 07/31/2012 0319   VLDL 9 07/01/2012 0019   LDLCALC 95 07/31/2012 0319   LDLCALC 61 07/01/2012 0019   HgbA1c:  Lab Results  Component Value Date   HGBA1C 8.8 (H) 07/31/2012   Urine Drug Screen:    Component Value Date/Time   LABOPIA NONE DETECTED 12/23/2016 0708   COCAINSCRNUR NONE DETECTED 12/23/2016 0708   LABBENZ  NONE DETECTED 12/23/2016 0708   AMPHETMU NONE DETECTED 12/23/2016 0708   THCU NONE DETECTED 12/23/2016 0708   LABBARB NONE DETECTED 12/23/2016 0708    Alcohol Level     Component Value Date/Time   ETH <5 12/23/2016 0143    IMAGING I have personally reviewed the radiological images below and agree with the radiology interpretations.  Ct Head Wo Contrast 12/22/2016 Normal head CT.  No acute intracranial process identified.  Mri and Mra Brain Wo Contrast and MRA neck with contrast 12/23/2016 IMPRESSION: 1. Symptoms of mild contrast reaction occurred and were treated with IV Benadryl. Recommend judicious use of MRI contrast in this patient going forward, and only after steroid premedication. 2.  No acute  intracranial abnormality. Signal changes in the cerebral white matter are nonspecific, but in conjunction with changes at the right caudate and one or more chronic micro-hemorrhages in the left hemisphere, are most suggestive of chronic small vessel disease. 3. Mild irregularity and stenosis at the left CCA origin does not appear hemodynamically significant. 4. Otherwise negative Neck MRA aside from vessel tortuosity. Incidental dominant left vertebral artery, and the right terminates in PICA. 5.  Negative intracranial MRA.   TTE pending   PHYSICAL EXAM  Temp:  [97.6 F (36.4 C)-98.1 F (36.7 C)] 98.1 F (36.7 C) (06/05 1338) Pulse Rate:  [64-85] 70 (06/05 1338) Resp:  [14-18] 18 (06/05 1338) BP: (124-140)/(74-91) 131/74 (06/05 1338) SpO2:  [96 %-100 %] 100 % (06/05 1338) Weight:  [202 lb (91.6 kg)-208 lb 15.9 oz (94.8 kg)] 208 lb 15.9 oz (94.8 kg) (06/05 0330)  General - Well nourished, well developed, in no apparent distress.  Ophthalmologic - Sharp disc margins OU.   Cardiovascular - Regular rate and rhythm.  Mental Status -  Level of arousal and orientation to time, place, and person were intact. Language including expression, naming, repetition, comprehension was assessed and found intact. Attention span and concentration were normal. Recent and remote memory were intact. Fund of Knowledge was assessed and was intact.  Cranial Nerves II - XII - II - Visual field intact OU. III, IV, VI - Extraocular movements intact. V - Facial sensation intact bilaterally. VII - Facial movement intact bilaterally. VIII - Hearing & vestibular intact bilaterally. X - Palate elevates symmetrically. XI - Chin turning & shoulder shrug intact bilaterally. XII - Tongue protrusion intact.  Motor Strength - The patient's strength was normal in all extremities and pronator drift was absent.  Bulk was normal and fasciculations were absent.   Motor Tone - Muscle tone was assessed at the neck and  appendages and was normal.  Reflexes - The patient's reflexes were 1+ in all extremities and he had no pathological reflexes.  Sensory - Light touch, temperature/pinprick, vibration and proprioception, and Romberg testing were assessed and were symmetrical.    Coordination - The patient had normal movements in the hands and feet with no ataxia or dysmetria.  Tremor was absent.  Gait and Station - The patient's transfers, posture, gait, station, and turns were observed as normal.   ASSESSMENT/PLAN Mr. ZAKHAI MEISINGER is a 56 y.o. male with history of DM, CAD s/p CABG and melanoma presenting with transient difficulty speaking and understanding following a HA. He did not receive IV t-PA due to out of the window.   Complicated migraine  Resultant  Neuro deficits resolved  CT head no acute process  MRI head no acute infarct  MRA head and neck unremarkable  2D Echo  pending  UDS  negative  LDL 104 in May  HgbA1c pending  Lovenox 40 mg sq daily for VTE prophylaxis  Diet heart healthy/carb modified Room service appropriate? Yes; Fluid consistency: Thin  aspirin 81 mg daily prior to admission, now on aspirin 81 mg daily. Continue ASA on discharge.  Recommend tylenol or ibuprofen ASAP once the migraine aura starts to see if HA can be aborted.  As the migraine is not very frequent, preventive medication is not necessary at this time  Therapy recommendations:  pending   Disposition:  pending  (lives w/ Roy [name])  Hypertension  Stable  BP goal normotensive  Hyperlipidemia  Home meds:  crestor 5, resumed in hospital  LDL 104 in May, goal < 100  Continue statin at discharge  Diabetes type II  HgbA1c pending, goal < 7.0  SSI  Home meds include junumet and actors  Other Stroke Risk Factors  Obesity, Body mass index is 30.86 kg/m., recommend weight loss, diet and exercise as appropriate   CAD s/p CABG  Other Active Problems  Aortic insufficiency s/p AVR  Hospital  day # 0  Neurology will sign off. Please call with questions. No neuro follow up needed at this time. Thanks for the consult.  Rosalin Hawking, MD PhD Stroke Neurology 12/23/2016 1:55 PM  To contact Stroke Continuity provider, please refer to [email or web address] After hours, contact General Neurology

## 2016-12-23 NOTE — H&P (Signed)
History and Physical  Patient Name: Roy [name]     PIR:[id number]    DOB: 09/15/1960    DOA: 12/23/2016 PCP: Harlan Stains, MD   Patient coming from: Home     Chief Complaint: Dizziness, aphasia  HPI: Roy [name] is a 56 y.o. male with a past medical history significant for CAD s/p CABG and AVR in 2014, NIDDM, PVD (left carotid) and HTN who presents with dizziness and aphasia.  Since his CABG, the patient has had migraine headaches with typical visual aura followed by neck and L retroorbital headache, as well as usually slurred speech and feeling confused.    Today, the patient was at work, has worked 12 hour days since last week, was outside, and around [place] started to have headache behind R eye, dizziness, left neck pain (also chronic).  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  He called his wife who was concerned that she couldn't understand what he was saying (he "was just speaking nonsense"), so he drove to meet her at a restaurant and claims he couldn't understand her at all, so she brought him to the ER.  No focal weakness.  Numbness in both hands, glove-pattern.  No visual disturbance.  ED course: -Afebrile, heart rate 85, respirations pulse ox normal, blood pressure 144/91 -Na 135, K 4.4, Cr 1.27 (baseline 1.1), WBC 3.8 K, Hgb 12.7 -All symptoms resolved by arrival to ER -Coags normal -Troponin negative -CT head unremarkable -ECG shows normal sinus rhythm -Case was discussed with neurology, who recommended TIA workup         Review of systems:  Review of Systems  Eyes: Negative for blurred vision, double vision and photophobia.  Neurological: Positive for dizziness, sensory change (bilateral hands), speech change and headaches. Negative for tingling, tremors, focal weakness, seizures and loss of consciousness.  All other systems reviewed and are negative.        Past Medical History:  Diagnosis Date  . Aortic  insufficiency    a. 07/2012 s/p AVR: #25 Magnaease Bioprosthetic Valve (performed @ time of CABG).  . CAD (coronary artery disease)    a. 12/2011 NSTEMI/PCI: Stenting of the OM;  b. 06/2012 NSTEMI/PCI: LCX 100%->BMS;  c. 07/2012 Cath: dissectied LM & LCX with patent LCX/OM stents->CABG x 3: LIMA->LAD, VG->OM1->OM2.  . Diabetes mellitus    type II  . Diverticulitis   . Hyperlipidemia   . Hypertension   . Melanoma ([place])   . Renal disorder   . Secondary and unspecified malignant neoplasm of lymph nodes, site unspecified   . Shortness of breath     Past Surgical History:  Procedure Laterality Date  . AORTIC VALVE REPLACEMENT  08/02/2012   Procedure: AORTIC VALVE REPLACEMENT (AVR);  Surgeon: Melrose Nakayama, MD;  Location: [facility];  Service: Open Heart Surgery;  Laterality: N/A;  . CARDIAC CATHETERIZATION  12-22-11   with OM1 Promus stent 12/22/11  . CORONARY ANGIOGRAM  07/30/2012   Procedure: CORONARY ANGIOGRAM;  Surgeon: Minus Breeding, MD;  Location: [facility] C[facility] LAB;  Service: Cardiovascular;;  . CORONARY ARTERY BYPASS GRAFT  08/02/2012   Procedure: CORONARY ARTERY BYPASS GRAFTING (CABG);  Surgeon: Melrose Nakayama, MD;  Location: [place];  Service: Open Heart Surgery;  Laterality: N/A;  . ENDOVEIN HARVEST OF GREATER SAPHENOUS VEIN  08/02/2012   Procedure: ENDOVEIN HARVEST OF GREATER SAPHENOUS VEIN;  Surgeon: Melrose Nakayama, MD;  Location: [place];  Service: Open Heart Surgery;  Laterality:  Left;  upper and lower leg  . EYE SURGERY    . INTRAOPERATIVE TRANSESOPHAGEAL ECHOCARDIOGRAM  08/02/2012   Procedure: INTRAOPERATIVE TRANSESOPHAGEAL ECHOCARDIOGRAM;  Surgeon: Melrose Nakayama, MD;  Location: [place];  Service: Open Heart Surgery;  Laterality: N/A;  . KERATOTOMIES     BILATERAL  . knee arthoscopy     LEFT KNEE  . KNEE ARTHROSCOPY  06/28/2012   Procedure: ARTHROSCOPY KNEE;  Surgeon: Vickey Huger, MD;  Location: [place];  Service: Orthopedics;  Laterality: Left;  . knee surgery     left  .  ROTATOR CUFF REPAIR     RIGHT  . SEPTOPLASTY    . TEE WITHOUT CARDIOVERSION  07/28/2012   Procedure: TRANSESOPHAGEAL ECHOCARDIOGRAM (TEE);  Surgeon: Thayer Headings, MD;  Location: [place];  Service: Cardiovascular;  Laterality: N/A;  . TONSILLECTOMY    . ULNAR TUNNEL RELEASE     RIGHT    Social History: Patient lives with his wife.  Patient walks unassisted.  He does not smoke or use alcohol to excess.  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    Allergies  Allergen Reactions  . Contrast Media [Iodinated Diagnostic Agents] Other (See Comments)    Pt'shad streaks up and down his arms  . Other     Animal hair    Family history: family history includes Coronary artery disease in his mother; Diabetes in his paternal grandmother; Heart attack in his paternal grandfather; Heart disease in his father; Hyperlipidemia in his brother; Lung cancer in his mother; Migraines in his father.  Prior to Admission medications   Medication Sig Start Date End Date Taking? Authorizing Provider  amoxicillin (AMOXIL) 500 MG capsule Take 4 tablets (2,000mg ) by mouth one hour prior to dental work. 09/25/16  Yes Jettie Booze, MD  aspirin EC 81 MG tablet Take 81 mg by mouth daily.   Yes [provider]  finasteride (PROPECIA) 1 MG tablet Take 0.25 mg by mouth daily.  11/21/15  Yes [provider]  JANUMET XR 50-1000 MG TB24 Take 2 tablets by mouth daily. 12/04/16  Yes [provider]  losartan (COZAAR) 25 MG tablet Take 25 mg by mouth daily.   Yes [provider]  pioglitazone (ACTOS) 30 MG tablet Take 30 mg by mouth daily.   Yes [provider]  rosuvastatin (CRESTOR) 5 MG tablet Take 1 tablet (5 mg total) by mouth daily. 11/18/16 02/16/17 Yes Jettie Booze, MD     Physical Exam: BP 135/85   Pulse 72   Temp 97.8 F (36.6 C) (Oral)   Resp 14   Ht 5\' 9"  (1.753 m)   Wt 91.6 kg (202 lb)   SpO2 100%   BMI 29.83 kg/m  General appearance: Well-developed,  adult male, alert and in no acute distress.   Eyes: Anicteric, conjunctiva pink, lids and lashes normal. PERRL.    ENT: No nasal deformity, discharge, epistaxis.  Hearing normal. OP moist without lesions.   Dentition good. Lymph: No cervical, supraclavicular or axillary lymphadenopathy. Skin: Warm and dry.  No jaundice.  No suspicious rashes or lesions. Cardiac: RRR, nl S1-S2, no murmurs appreciated.  Capillary refill is brisk.  JVP normal.  No LE edema.  Radial and DP pulses 2+ and symmetric.  No carotid bruits. Respiratory: Normal respiratory rate and rhythm.  CTAB without rales or wheezes. GI: Abdomen soft without rigidity.  No TTP. No ascites, distension, no hepatosplenomegaly.   MSK: No deformities or effusions. Neuro: Pupils are 4 mm  and reactive to 3 mm. Extraocular movements are intact, without nystagmus. Cranial nerve 5 is within normal limits. Cranial nerve 7 is symmetrical. Cranial nerve 8 is within normal limits. Cranial nerves 9 and 10 reveal equal palate elevation. Cranial nerve 11 reveals sternocleidomastoid strong. Cranial nerve 12 is midline. I do not note a deficit in motor strength testing in the upper and lower extremities bilaterally with normal motor, tone and bulk. No pronator drift. Finger-to-nose testing is within normal limits. Speech is fluent. Naming is grossly intact. Attention span and concentration are within normal limits.   Psych: The patient is oriented to time, place and person. Behavior appropriate.  Affect normal.  Recall, recent and remote, as well as general fund of knowledge seem within normal limits. No evidence of aural or visual hallucinations or delusions.       Labs on Admission:  I have personally reviewed following labs and imaging studies: CBC:  Recent Labs Lab 12/22/16 2137 12/22/16 2206  WBC 3.8*  --   NEUTROABS 1.8  --   HGB 12.7* 12.6*  HCT 37.8* 37.0*  MCV 87.9  --   PLT 123*  --    Basic Metabolic Panel:  Recent  Labs Lab 12/22/16 2137 12/22/16 2206  NA 135 138  K 4.4 4.3  CL 103 102  CO2 22  --   GLUCOSE 272* 286*  BUN 16 20  CREATININE 1.27* 1.20  CALCIUM 9.2  --    GFR: Estimated Creatinine Clearance: 77.8 mL/min (by C-G formula based on SCr of 1.2 mg/dL). Liver Function Tests:  Recent Labs Lab 12/22/16 2137  AST 25  ALT 24  ALKPHOS 57  BILITOT 0.8  PROT 6.9  ALBUMIN 4.1   No results for input(s): LIPASE, AMYLASE in the last 168 hours. No results for input(s): AMMONIA in the last 168 hours. Coagulation Profile:  Recent Labs Lab 12/22/16 2137  INR 1.02   Cardiac Enzymes: No results for input(s): CKTOTAL, CKMB, CKMBINDEX, TROPONINI in the last 168 hours. BNP (last 3 results) No results for input(s): PROBNP in the last 8760 hours. HbA1C: No results for input(s): HGBA1C in the last 72 hours. CBG:  Recent Labs Lab 12/22/16 2123  GLUCAP 292*   Lipid Profile: No results for input(s): CHOL, HDL, LDLCALC, TRIG, CHOLHDL, LDLDIRECT in the last 72 hours. Thyroid Function Tests: No results for input(s): TSH, T4TOTAL, FREET4, T3FREE, THYROIDAB in the last 72 hours. Anemia Panel: No results for input(s): VITAMINB12, FOLATE, FERRITIN, TIBC, IRON, RETICCTPCT in the last 72 hours. Sepsis Labs: Invalid input(s): PROCALCITONIN, LACTICIDVEN No results found for this or any previous visit (from the past 240 hour(s)).    Radiological Exams on Admission: Personally reviewed CT head report: Ct Head Wo Contrast  Result Date: 12/22/2016 CLINICAL DATA:  Initial valuation for acute confusion, dizziness. EXAM: CT HEAD WITHOUT CONTRAST TECHNIQUE: Contiguous axial images were obtained from the base of the skull through the vertex without intravenous contrast. COMPARISON:  None. FINDINGS: Brain: Cerebral volume within normal limits for patient age. No evidence for acute intracranial hemorrhage. No findings to suggest acute large vessel territory infarct. No mass lesion, midline shift, or mass  effect. Ventricles are normal in size without evidence for hydrocephalus. No extra-axial fluid collection identified. Vascular: No hyperdense vessel identified. Skull: Scalp soft tissues demonstrate no acute abnormality.Calvarium intact. Sinuses/Orbits: Globes and orbital soft tissues are within normal limits. Mild scattered mucosal thickening within the ethmoidal air cells. Paranasal sinuses are otherwise clear. No mastoid effusion. IMPRESSION: Normal head CT.  No acute intracranial process identified. Electronically Signed   By: Jeannine Boga M.D.   On: 12/22/2016 23:59     EKG: Independently reviewed. Rate 84, QTc normal, no ST changes.  Echocardiogram 2014: EF 60-65% Valve vegetation on AV, severe regurgitation.            Assessment/Plan  1. Suspected TIA:  Vs possible complicated migraine.  MRI pending.  ABCD 4.  Barring evidence of progression of left carotid disesase, risk factor management for TIA would be similar to for his CABG, and so risk of labeling this TIA is low. -Admit to telemetry -Neuro checks, NIHSS per protocol -Continue daily aspirin 81 mg -Lipids normal last month except LDL 104 -MR brain, MRA head and neck ordered -Echocardiogram ordered -Consult to Neurology, appreciate recommendations   2. CAD s/p CABG and HTN:  Normotensive at admission.  Unclear why he is not on BB. -Continue ARB -Continue aspirin, statin  3. Diabetes:  -Hold orals -SSI with meals        DVT prophylaxis: Lovenox  Code Status: FULL  Family Communication: None present  Disposition Plan: Anticipate Stroke work up as above and consult to ancillary services.  Expect discharge within 1 day. Consults called: Neurology, Dr. Leonel Ramsay has seen patient. Admission status: Telemetry, OBS status  Core measures: -VTE prophylaxis ordered at time of admission -Aspirin ordered at admission -Atrial fibrillation: not present -tPA not given because of symptoms already  resolved -Dysphagia screen ordered in ER -Lipids completed within last 30 days    Medical decision making: Patient seen at 2:00 AM on 12/23/2016.  The patient was discussed with Dr. Leonel Ramsay, Dr. Christy Gentles. What exists of the patient's chart was reviewed in depth and summarized above.  Clinical condition: stable.       Edwin Dada [facility] Pager [phone]

## 2016-12-23 NOTE — Discharge Summary (Addendum)
Physician Discharge Summary  Roy [name] [facility]:[id number] DOB: 1961-02-18 DOA: 12/23/2016  PCP: Harlan Stains, MD  Admit date: 12/23/2016 Discharge date: 12/23/2016   Recommendations for Outpatient Follow-Up:     Discharge Diagnosis:     HTN (hypertension)   CAD S/P percutaneous coronary angioplasty   Diabetes mellitus [facility])   Aortic insufficiency   Complicated migraine   Discharge disposition:  Home.   Discharge Condition: Improved.  Diet recommendation: Low sodium, heart healthy.  Carbohydrate-modified  Wound care: None.   History of Present Illness:   Roy [name] is a 56 y.o. male with a past medical history significant for CAD s/p CABG and AVR in 2014, NIDDM, PVD (left carotid) and HTN who presents with dizziness and aphasia.  Since his CABG, the patient has had migraine headaches with typical visual aura followed by neck and L retroorbital headache, as well as usually slurred speech and feeling confused.    Today, the patient was at work, has worked 12 hour days since last week, was outside, and around [place] started to have headache behind R eye, dizziness, left neck pain (also chronic).  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  He called his wife who was concerned that she couldn't understand what he was saying (he "was just speaking nonsense"), so he drove to meet her at a restaurant and claims he couldn't understand her at all, so she brought him to the ER.  No focal weakness.  Numbness in both hands, glove-pattern.  No visual disturbance.   Hospital Course by Problem:   Complicated migraine  Resultant  Neuro deficits resolved  CT head no acute process  MRI head no acute infarct  MRA head and neck unremarkable  2D Echo   UDS negative  LDL 104 in May  HgbA1c pending  aspirin 81 mg daily prior to admission, now on aspirin 81 mg daily. Continue ASA on discharge.  Recommend tylenol or ibuprofen ASAP once the  migraine aura starts to see if HA can be aborted.  As the migraine is not very frequent, preventive medication is not necessary at this time    Hypertension  Stable    BP goal normotensive  Hyperlipidemia  Home meds:  crestor 5, resumed in hospital  LDL 104 in May, goal < 100  Continue statin at discharge  Diabetes type II  HgbA1c pending  Home meds include junumet and actors    Medical Consultants:    Neuro.   Discharge Exam:   Vitals:   12/23/16 1028 12/23/16 1338  BP: 128/83 131/74  Pulse: 71 70  Resp: 16 18  Temp: 97.6 F (36.4 C) 98.1 F (36.7 C)   Vitals:   12/23/16 0530 12/23/16 0740 12/23/16 1028 12/23/16 1338  BP: 140/77 128/76 128/83 131/74  Pulse: 64 78 71 70  Resp: 14 14 16 18   Temp:  98 F (36.7 C) 97.6 F (36.4 C) 98.1 F (36.7 C)  TempSrc:  Oral Oral Oral  SpO2: 99% 99% 98% 100%  Weight:      Height:        Gen:  NAD   The results of significant diagnostics from this hospitalization (including imaging, microbiology, ancillary and laboratory) are listed below for reference.     Procedures and Diagnostic Studies:   Mr [name] Contrast  Result Date: 12/23/2016 CLINICAL DATA:  56 year old male with 3 hour episode of loss of language associated with headache. Difficulty speaking an understanding.  Transient bilateral hand numbness. Gradual symptom improvement. EXAM: MRI HEAD WITHOUT CONTRAST MRA HEAD WITHOUT CONTRAST MRA NECK WITHOUT AND WITH CONTRAST TECHNIQUE: Multiplanar, multiecho pulse sequences of the brain and surrounding structures were obtained withoutintravenous contrast. Angiographic images of the Circle of Willis were obtained using MRA technique without intravenous contrast. Angiographic images of the neck were obtained using MRA technique without and with intravenous contrast. Carotid stenosis measurements (when applicable) are obtained utilizing NASCET criteria, using the distal internal carotid diameter as the  denominator. CONTRAST:  28mL MULTIHANCE GADOBENATE DIMEGLUMINE 529 MG/ML IV SOLN Dr. Jola Baptist was alerted to a mild contrast reaction. IV Benadryl was administered. COMPARISON:  Head CT without contrast 12/22/2016 FINDINGS: MRI HEAD FINDINGS Brain: Cerebral volume is normal. No restricted diffusion to suggest acute infarction. No midline shift, mass effect, evidence of mass lesion, ventriculomegaly, extra-axial collection or acute intracranial hemorrhage. Cervicomedullary junction and pituitary are within normal limits. No cortical encephalomalacia identified, but there are possibly 3 small areas of chronic microhemorrhage in the left hemisphere seen on series 9, images 11 and 12. Of these the focus in the left periatrial white matter is most convincing. Scattered small abnormal T2 and FLAIR hyperintensity in the bilateral mostly subcortical cerebral white matter. Mild periventricular component. Small chronic lacunar infarct adjacent to the right caudate nucleus. Other wise deep gray matter nuclei, the brainstem, and cerebellum appear normal. Vascular: Major intracranial vascular flow voids are preserved. Dominant appearing distal left vertebral artery. Skull and upper cervical spine: Negative. Normal bone marrow signal. Sinuses/Orbits: Normal orbits soft tissues. Visualized paranasal sinuses and mastoids are stable and well pneumatized. Other: Visible internal auditory structures appear normal. Negative scalp soft tissues. MRA NECK FINDINGS Precontrast time-of-flight images demonstrate antegrade flow in both carotid arteries and the left vertebral artery in the neck. The left vertebral artery appears dominant. There is also antegrade flow detected in the diminutive right vertebral artery throughout the neck. Post-contrast neck MRA images reveal a bovine type aortic arch configuration. The right CCA appears normal aside from mild proximal tortuosity. Normal right carotid bifurcation. Normal cervical right ICA. Bovine  type left CCA origin with mild origins stenosis which does not appear hemodynamically significant (series 1401, image 27). Mildly tortuous proximal left CCA. Widely patent left carotid bifurcation. Negative cervical left ICA aside from mild tortuosity. No proximal right subclavian artery stenosis. The proximal left subclavian artery is less well visualized. The left vertebral artery is dominant with a normal origin. The left vertebral artery is mildly tortuous in the V1 segment but otherwise normal to the skullbase. Non dominant right vertebral artery appears within normal limits throughout the neck. MRA HEAD FINDINGS Antegrade flow in the posterior circulation which appears supplied by the dominant left vertebral artery. The diminutive right vertebral artery terminates in PICA. Left PICA origin appears normal. No basilar stenosis. Mild basilar tortuosity. Normal SCA and PCA origins. Right posterior communicating artery is present while the left is diminutive or absent. Bilateral PCA branches are within normal limits. Antegrade flow in both ICA siphons. Tortuous distal cervical left ICA. No siphon stenosis. Normal ophthalmic and right posterior communicating artery origins. Normal carotid termini, MCA and ACA origins. Anterior communicating artery and visualized ACA branches are within normal limits; the right A2 segment appears dominant. Early left MCA branching, but the left MCA M1 segment and visible left MCA branches appear normal. Right MCA M1 segment, MCA trifurcation, and visible right MCA branches are within normal limits. IMPRESSION: 1. Symptoms of mild contrast reaction occurred and were treated  with IV Benadryl. Recommend judicious use of MRI contrast in this patient going forward, and only after steroid premedication. 2.  No acute intracranial abnormality. Signal changes in the cerebral white matter are nonspecific, but in conjunction with changes at the right caudate and one or more chronic  micro-hemorrhages in the left hemisphere, are most suggestive of chronic small vessel disease. 3. Mild irregularity and stenosis at the left CCA origin does not appear hemodynamically significant. 4. Otherwise negative Neck MRA aside from vessel tortuosity. Incidental dominant left vertebral artery, and the right terminates in PICA. 5.  Negative intracranial MRA. Electronically Signed   By: Genevie Ann M.D.   On: 12/23/2016 11:57   Mr Jodene Nam Neck W Contrast  Result Date: 12/23/2016 CLINICAL DATA:  56 year old male with 3 hour episode of loss of language associated with headache. Difficulty speaking an understanding. Transient bilateral hand numbness. Gradual symptom improvement. EXAM: MRI HEAD WITHOUT CONTRAST MRA HEAD WITHOUT CONTRAST MRA NECK WITHOUT AND WITH CONTRAST TECHNIQUE: Multiplanar, multiecho pulse sequences of the brain and surrounding structures were obtained withoutintravenous contrast. Angiographic images of the Circle of Willis were obtained using MRA technique without intravenous contrast. Angiographic images of the neck were obtained using MRA technique without and with intravenous contrast. Carotid stenosis measurements (when applicable) are obtained utilizing NASCET criteria, using the distal internal carotid diameter as the denominator. CONTRAST:  29mL MULTIHANCE GADOBENATE DIMEGLUMINE 529 MG/ML IV SOLN Dr. Jola Baptist was alerted to a mild contrast reaction. IV Benadryl was administered. COMPARISON:  Head CT without contrast 12/22/2016 FINDINGS: MRI HEAD FINDINGS Brain: Cerebral volume is normal. No restricted diffusion to suggest acute infarction. No midline shift, mass effect, evidence of mass lesion, ventriculomegaly, extra-axial collection or acute intracranial hemorrhage. Cervicomedullary junction and pituitary are within normal limits. No cortical encephalomalacia identified, but there are possibly 3 small areas of chronic microhemorrhage in the left hemisphere seen on series 9, images 11 and 12.  Of these the focus in the left periatrial white matter is most convincing. Scattered small abnormal T2 and FLAIR hyperintensity in the bilateral mostly subcortical cerebral white matter. Mild periventricular component. Small chronic lacunar infarct adjacent to the right caudate nucleus. Other wise deep gray matter nuclei, the brainstem, and cerebellum appear normal. Vascular: Major intracranial vascular flow voids are preserved. Dominant appearing distal left vertebral artery. Skull and upper cervical spine: Negative. Normal bone marrow signal. Sinuses/Orbits: Normal orbits soft tissues. Visualized paranasal sinuses and mastoids are stable and well pneumatized. Other: Visible internal auditory structures appear normal. Negative scalp soft tissues. MRA NECK FINDINGS Precontrast time-of-flight images demonstrate antegrade flow in both carotid arteries and the left vertebral artery in the neck. The left vertebral artery appears dominant. There is also antegrade flow detected in the diminutive right vertebral artery throughout the neck. Post-contrast neck MRA images reveal a bovine type aortic arch configuration. The right CCA appears normal aside from mild proximal tortuosity. Normal right carotid bifurcation. Normal cervical right ICA. Bovine type left CCA origin with mild origins stenosis which does not appear hemodynamically significant (series 1401, image 27). Mildly tortuous proximal left CCA. Widely patent left carotid bifurcation. Negative cervical left ICA aside from mild tortuosity. No proximal right subclavian artery stenosis. The proximal left subclavian artery is less well visualized. The left vertebral artery is dominant with a normal origin. The left vertebral artery is mildly tortuous in the V1 segment but otherwise normal to the skullbase. Non dominant right vertebral artery appears within normal limits throughout the neck. MRA HEAD FINDINGS Antegrade flow  in the posterior circulation which appears  supplied by the dominant left vertebral artery. The diminutive right vertebral artery terminates in PICA. Left PICA origin appears normal. No basilar stenosis. Mild basilar tortuosity. Normal SCA and PCA origins. Right posterior communicating artery is present while the left is diminutive or absent. Bilateral PCA branches are within normal limits. Antegrade flow in both ICA siphons. Tortuous distal cervical left ICA. No siphon stenosis. Normal ophthalmic and right posterior communicating artery origins. Normal carotid termini, MCA and ACA origins. Anterior communicating artery and visualized ACA branches are within normal limits; the right A2 segment appears dominant. Early left MCA branching, but the left MCA M1 segment and visible left MCA branches appear normal. Right MCA M1 segment, MCA trifurcation, and visible right MCA branches are within normal limits. IMPRESSION: 1. Symptoms of mild contrast reaction occurred and were treated with IV Benadryl. Recommend judicious use of MRI contrast in this patient going forward, and only after steroid premedication. 2.  No acute intracranial abnormality. Signal changes in the cerebral white matter are nonspecific, but in conjunction with changes at the right caudate and one or more chronic micro-hemorrhages in the left hemisphere, are most suggestive of chronic small vessel disease. 3. Mild irregularity and stenosis at the left CCA origin does not appear hemodynamically significant. 4. Otherwise negative Neck MRA aside from vessel tortuosity. Incidental dominant left vertebral artery, and the right terminates in PICA. 5.  Negative intracranial MRA. Electronically Signed   By: Genevie Ann M.D.   On: 12/23/2016 11:57   Mr Brain Wo Contrast  Result Date: 12/23/2016 CLINICAL DATA:  56 year old male with 3 hour episode of loss of language associated with headache. Difficulty speaking an understanding. Transient bilateral hand numbness. Gradual symptom improvement. EXAM: MRI HEAD  WITHOUT CONTRAST MRA HEAD WITHOUT CONTRAST MRA NECK WITHOUT AND WITH CONTRAST TECHNIQUE: Multiplanar, multiecho pulse sequences of the brain and surrounding structures were obtained withoutintravenous contrast. Angiographic images of the Circle of Willis were obtained using MRA technique without intravenous contrast. Angiographic images of the neck were obtained using MRA technique without and with intravenous contrast. Carotid stenosis measurements (when applicable) are obtained utilizing NASCET criteria, using the distal internal carotid diameter as the denominator. CONTRAST:  12mL MULTIHANCE GADOBENATE DIMEGLUMINE 529 MG/ML IV SOLN Dr. Jola Baptist was alerted to a mild contrast reaction. IV Benadryl was administered. COMPARISON:  Head CT without contrast 12/22/2016 FINDINGS: MRI HEAD FINDINGS Brain: Cerebral volume is normal. No restricted diffusion to suggest acute infarction. No midline shift, mass effect, evidence of mass lesion, ventriculomegaly, extra-axial collection or acute intracranial hemorrhage. Cervicomedullary junction and pituitary are within normal limits. No cortical encephalomalacia identified, but there are possibly 3 small areas of chronic microhemorrhage in the left hemisphere seen on series 9, images 11 and 12. Of these the focus in the left periatrial white matter is most convincing. Scattered small abnormal T2 and FLAIR hyperintensity in the bilateral mostly subcortical cerebral white matter. Mild periventricular component. Small chronic lacunar infarct adjacent to the right caudate nucleus. Other wise deep gray matter nuclei, the brainstem, and cerebellum appear normal. Vascular: Major intracranial vascular flow voids are preserved. Dominant appearing distal left vertebral artery. Skull and upper cervical spine: Negative. Normal bone marrow signal. Sinuses/Orbits: Normal orbits soft tissues. Visualized paranasal sinuses and mastoids are stable and well pneumatized. Other: Visible internal  auditory structures appear normal. Negative scalp soft tissues. MRA NECK FINDINGS Precontrast time-of-flight images demonstrate antegrade flow in both carotid arteries and the left vertebral artery in the neck.  The left vertebral artery appears dominant. There is also antegrade flow detected in the diminutive right vertebral artery throughout the neck. Post-contrast neck MRA images reveal a bovine type aortic arch configuration. The right CCA appears normal aside from mild proximal tortuosity. Normal right carotid bifurcation. Normal cervical right ICA. Bovine type left CCA origin with mild origins stenosis which does not appear hemodynamically significant (series 1401, image 27). Mildly tortuous proximal left CCA. Widely patent left carotid bifurcation. Negative cervical left ICA aside from mild tortuosity. No proximal right subclavian artery stenosis. The proximal left subclavian artery is less well visualized. The left vertebral artery is dominant with a normal origin. The left vertebral artery is mildly tortuous in the V1 segment but otherwise normal to the skullbase. Non dominant right vertebral artery appears within normal limits throughout the neck. MRA HEAD FINDINGS Antegrade flow in the posterior circulation which appears supplied by the dominant left vertebral artery. The diminutive right vertebral artery terminates in PICA. Left PICA origin appears normal. No basilar stenosis. Mild basilar tortuosity. Normal SCA and PCA origins. Right posterior communicating artery is present while the left is diminutive or absent. Bilateral PCA branches are within normal limits. Antegrade flow in both ICA siphons. Tortuous distal cervical left ICA. No siphon stenosis. Normal ophthalmic and right posterior communicating artery origins. Normal carotid termini, MCA and ACA origins. Anterior communicating artery and visualized ACA branches are within normal limits; the right A2 segment appears dominant. Early left MCA  branching, but the left MCA M1 segment and visible left MCA branches appear normal. Right MCA M1 segment, MCA trifurcation, and visible right MCA branches are within normal limits. IMPRESSION: 1. Symptoms of mild contrast reaction occurred and were treated with IV Benadryl. Recommend judicious use of MRI contrast in this patient going forward, and only after steroid premedication. 2.  No acute intracranial abnormality. Signal changes in the cerebral white matter are nonspecific, but in conjunction with changes at the right caudate and one or more chronic micro-hemorrhages in the left hemisphere, are most suggestive of chronic small vessel disease. 3. Mild irregularity and stenosis at the left CCA origin does not appear hemodynamically significant. 4. Otherwise negative Neck MRA aside from vessel tortuosity. Incidental dominant left vertebral artery, and the right terminates in PICA. 5.  Negative intracranial MRA. Electronically Signed   By: Genevie Ann M.D.   On: 12/23/2016 11:57     Labs:   Basic Metabolic Panel:  Recent Labs Lab 12/22/16 2137 12/22/16 2206  NA 135 138  K 4.4 4.3  CL 103 102  CO2 22  --   GLUCOSE 272* 286*  BUN 16 20  CREATININE 1.27* 1.20  CALCIUM 9.2  --    GFR Estimated Creatinine Clearance: 79 mL/min (by C-G formula based on SCr of 1.2 mg/dL). Liver Function Tests:  Recent Labs Lab 12/22/16 2137  AST 25  ALT 24  ALKPHOS 57  BILITOT 0.8  PROT 6.9  ALBUMIN 4.1   No results for input(s): LIPASE, AMYLASE in the last 168 hours. No results for input(s): AMMONIA in the last 168 hours. Coagulation profile  Recent Labs Lab 12/22/16 2137  INR 1.02    CBC:  Recent Labs Lab 12/22/16 2137 12/22/16 2206  WBC 3.8*  --   NEUTROABS 1.8  --   HGB 12.7* 12.6*  HCT 37.8* 37.0*  MCV 87.9  --   PLT 123*  --    Cardiac Enzymes: No results for input(s): CKTOTAL, CKMB, CKMBINDEX, TROPONINI in the last 168  hours. BNP: Invalid input(s): POCBNP CBG:  Recent  Labs Lab 12/22/16 2123 12/23/16 0629 12/23/16 1141  GLUCAP 292* 169* 227*   D-Dimer No results for input(s): DDIMER in the last 72 hours. Hgb A1c No results for input(s): HGBA1C in the last 72 hours. Lipid Profile No results for input(s): CHOL, HDL, LDLCALC, TRIG, CHOLHDL, LDLDIRECT in the last 72 hours. Thyroid function studies  Recent Labs  12/23/16 0702  TSH 3.143   Anemia work up  Recent Labs  12/23/16 0702  [id number] 286   Microbiology No results found for this or any previous visit (from the past 240 hour(s)).   Discharge Instructions:   Discharge Instructions    Diet - low sodium heart healthy    Complete by:  As directed    Discharge instructions    Complete by:  As directed    Recommend tylenol or ibuprofen ASAP once the migraine aura starts to see if HA can be aborted Stay hydrated if working outside   Increase activity slowly    Complete by:  As directed      Allergies as of 12/23/2016      Reactions   Contrast Media [iodinated Diagnostic Agents] Other (See Comments)   Pt'shad streaks up and down his arms   Other    Animal hair   Gadolinium Derivatives Itching, Swelling   MultiHance adminstered. Itching of eyes, ears, throat. Tongue thickness.      Medication List    TAKE these medications   amoxicillin 500 MG capsule Commonly known as:  AMOXIL Take 4 tablets (2,000mg ) by mouth one hour prior to dental work.   aspirin EC 81 MG tablet Take 81 mg by mouth daily.   finasteride 1 MG tablet Commonly known as:  PROPECIA Take 0.25 mg by mouth daily.   JANUMET XR 50-1000 MG Tb24 Generic drug:  SitaGLIPtin-MetFORMIN HCl Take 2 tablets by mouth daily.   losartan 25 MG tablet Commonly known as:  COZAAR Take 25 mg by mouth daily.   pioglitazone 30 MG tablet Commonly known as:  ACTOS Take 30 mg by mouth daily.   rosuvastatin 5 MG tablet Commonly known as:  CRESTOR Take 1 tablet (5 mg total) by mouth daily.         Time coordinating  discharge: 25 min  Signed:  Tauren Delbuono U Yardley [place]   [facility] 12/23/2016, 2:02 PM

## 2016-12-23 NOTE — Progress Notes (Signed)
Arrived from ED. Alert and oriented. C/o of left posterior neck pain 4/10. Call light within reach.

## 2016-12-23 NOTE — Progress Notes (Signed)
  Echocardiogram 2D Echocardiogram with definity has been performed.  Darlina Sicilian M 12/23/2016, 1:08 PM

## 2016-12-23 NOTE — Progress Notes (Signed)
Patient admitted after midnight, please see H&P.  Await MRI, echo, carotid  Eulogio Bear DO

## 2016-12-23 NOTE — Care Management Note (Signed)
Case Management Note  Patient Details  Name: Roy [name] MRN: [id number] Date of Birth: 1960/12/17  Subjective/Objective:    Pt in to r/o CVA. He is from home with spouse.                 Action/Plan: PT with no f/u. Awaiting OT recommendations. CM following for d/c needs, physician orders.  Expected Discharge Date:                  Expected Discharge Plan:  Home/Self Care  In-House Referral:     Discharge planning Services     Post Acute Care Choice:    Choice offered to:     DME Arranged:    DME Agency:     HH Arranged:    HH Agency:     Status of Service:  In process, will continue to follow  If discussed at Long Length of Stay Meetings, dates discussed:    Additional Comments:  Pollie Friar, RN 12/23/2016, 11:47 AM

## 2016-12-23 NOTE — ED Notes (Signed)
Pt states he doesn't have cp

## 2016-12-23 NOTE — Evaluation (Signed)
Physical Therapy Evaluation Patient Details Name: Roy [name] MRN: [id number] DOB: 1961-03-04 Today's Date: 12/23/2016   History of Present Illness   Roy [name] is a 56 y.o. male with a past medical history significant for CAD s/p CABG and AVR in 2014, NIDDM, PVD (left carotid) and HTN who presents with dizziness and aphasia.Marland Kitchen  MRI results pending.  Clinical Impression  Pt is at or close to baseline functioning and should be safe at home . There are no further acute PT needs.  Will sign off at this time.     Follow Up Recommendations No PT follow up    Equipment Recommendations  None recommended by PT    Recommendations for Other Services       Precautions / Restrictions Precautions Precautions: None Restrictions Weight Bearing Restrictions: No      Mobility  Bed Mobility Overal bed mobility: Independent                Transfers Overall transfer level: Independent                  Ambulation/Gait Ambulation/Gait assistance: Independent Ambulation Distance (Feet): 500 Feet Assistive device: None Gait Pattern/deviations: WFL(Within Functional Limits)   Gait velocity interpretation: >2.62 ft/sec, indicative of independent community ambulator    Stairs Stairs: Yes   Stair Management: No rails;Alternating pattern;Forwards Number of Stairs: 6 General stair comments: safe no rail  Wheelchair Mobility    Modified Rankin (Stroke Patients Only) Modified Rankin (Stroke Patients Only) Pre-Morbid Rankin Score: No symptoms Modified Rankin: No symptoms     Balance Overall balance assessment: Independent                               Standardized Balance Assessment Standardized Balance Assessment : Dynamic Gait Index   Dynamic Gait Index Level Surface: Normal Change in Gait Speed: Normal Gait with Horizontal Head Turns: Normal Gait with Vertical Head Turns: Normal Gait and Pivot Turn: Normal Step Over Obstacle: Normal Step  Around Obstacles: Normal Steps: Normal Total Score: 24       Pertinent Vitals/Pain Pain Assessment: No/denies pain    Home Living Family/patient expects to be discharged to:: Private residence Living Arrangements: Spouse/significant other;Children Available Help at Discharge: Family Type of Home: House Home Access: Stairs to enter Entrance Stairs-Rails: Psychiatric nurse of Steps: several Home Layout: Two level;Able to live on main level with bedroom/bathroom Home Equipment: None      Prior Function Level of Independence: Independent         Comments: working     Journalist, newspaper        Extremity/Trunk Assessment   Upper Extremity Assessment Upper Extremity Assessment: Overall WFL for tasks assessed    Lower Extremity Assessment Lower Extremity Assessment: Overall WFL for tasks assessed (L side hams and hip flexors with mild weakness.)    Cervical / Trunk Assessment Cervical / Trunk Assessment: Normal  Communication   Communication: No difficulties  Cognition Arousal/Alertness: Awake/alert Behavior During Therapy: WFL for tasks assessed/performed Overall Cognitive Status: Within Functional Limits for tasks assessed                                        General Comments General comments (skin integrity, edema, etc.): Initial had trouble negotiating the questioning, but was sleepy and lethargic.  Later, seemed to process well.  Exercises     Assessment/Plan    PT Assessment Patent does not need any further PT services  PT Problem List         PT Treatment Interventions      PT Goals (Current goals can be found in the Care Plan section)  Acute Rehab PT Goals PT Goal Formulation: All assessment and education complete, DC therapy    Frequency     Barriers to discharge        Co-evaluation               AM-PAC PT "6 Clicks" Daily Activity  Outcome Measure Difficulty turning over in bed (including  adjusting bedclothes, sheets and blankets)?: None Difficulty moving from lying on back to sitting on the side of the bed? : None Difficulty sitting down on and standing up from a chair with arms (e.g., wheelchair, bedside commode, etc,.)?: None Help needed moving to and from a bed to chair (including a wheelchair)?: None Help needed walking in hospital room?: None Help needed climbing 3-5 steps with a railing? : None 6 Click Score: 24    End of Session   Activity Tolerance: Patient tolerated treatment well Patient left: in bed;with call bell/phone within reach Nurse Communication: Mobility status PT Visit Diagnosis: Other symptoms and signs involving the nervous system (R29.898)    Time: 0867-6195 PT Time Calculation (min) (ACUTE ONLY): 16 min   Charges:   PT Evaluation $PT Eval Low Complexity: 1 Procedure     PT G Codes:   PT G-Codes **NOT FOR INPATIENT CLASS** Functional Assessment Tool Used: AM-PAC 6 Clicks Basic Mobility;Clinical judgement Functional Limitation: Mobility: Walking and moving around Mobility: Walking and Moving Around Current Status (K9326): 0 percent impaired, limited or restricted Mobility: Walking and Moving Around Goal Status (Z1245): 0 percent impaired, limited or restricted Mobility: Walking and Moving Around Discharge Status [phone]): 0 percent impaired, limited or restricted    12/23/2016  Donnella Sham, PT [phone]  (pager)  [name] 12/23/2016, 11:20 AM

## 2016-12-23 NOTE — ED Notes (Signed)
Pt states still feel dizzy

## 2016-12-23 NOTE — ED Notes (Signed)
Pt states he had difficulty speaking, walking has a been getting gradually worse, tingling in both arms.

## 2016-12-24 LAB — HEMOGLOBIN A1C
HEMOGLOBIN A1C: 11.1 % — AB (ref 4.8–5.6)
MEAN PLASMA GLUCOSE: 272 mg/dL

## 2017-10-02 ENCOUNTER — Encounter: Payer: Self-pay | Admitting: Interventional Cardiology

## 2017-10-08 ENCOUNTER — Ambulatory Visit: Payer: BLUE CROSS/BLUE SHIELD | Admitting: Interventional Cardiology

## 2017-10-12 NOTE — Progress Notes (Signed)
Cardiology Office Note   Date:  10/13/2017   ID:  ABU HEAVIN, DOB [birth year], MRN [id number]  PCP:  Harlan Stains, MD    No chief complaint on file.  S/p AVR, CAD  Wt Readings from Last 3 Encounters:  10/13/17 223 lb 3.2 oz (101.2 kg)  12/23/16 208 lb 15.9 oz (94.8 kg)  09/25/16 223 lb 6.4 oz (101.3 kg)       History of Present Illness: Roy [name] is a 57 y.o. male  who has a family h/o CAD. He had an MI in 6/13 with a stent placed, with jaw pain and chest pain. In 12/13, he had knee surgery and 2 days later, he had an MI. He had a stent placed at that time. 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 He feels that the valve was damaged during the cath and wanted a new cardiologist, so he saw me for the first time in 2014. He feels better now and had a tissue valve due to not wanting to take coumadin.  He had a question of TIA in June 2018.  It turned out to be a migraine per his report.  He had an echo at that time showing:.   Left ventricle: The cavity size was normal. There was moderate   concentric hypertrophy. Systolic function was vigorous. The   estimated ejection fraction was in the range of 65% to 70%. Wall   motion was normal; there were no regional wall motion   abnormalities. Doppler parameters are consistent with abnormal   left ventricular relaxation (grade 1 diastolic dysfunction). - Aortic valve: Poorly visualized. Trileaflet; mildly thickened,   mildly calcified leaflets. There was mild stenosis. Peak velocity   (S): 253 cm/s. Mean gradient (S): 15 mm Hg. - Mitral valve: Moderately calcified annulus. - Left atrium: The atrium was mildly dilated.  Impressions:  - No cardiac source of emboli was indentified.   Denies : Exertional Chest pain. Dizziness. Leg edema. Nitroglycerin use. Orthopnea. Palpitations. Paroxysmal nocturnal dyspnea. Shortness of breath. Syncope.   Still active on the job.  Migraine  headaches have improved.     Past Medical History:  Diagnosis Date  . Aortic insufficiency    a. 07/2012 s/p AVR: #25 Magnaease Bioprosthetic Valve (performed @ time of CABG).  . CAD (coronary artery disease)    a. 12/2011 NSTEMI/PCI: Stenting of the OM;  b. 06/2012 NSTEMI/PCI: LCX 100%->BMS;  c. 07/2012 Cath: dissectied LM & LCX with patent LCX/OM stents->CABG x 3: LIMA->LAD, VG->OM1->OM2.  . Diabetes mellitus    type II  . Diverticulitis   . Hyperlipidemia   . Hypertension   . Melanoma ([place])   . Renal disorder   . Secondary and unspecified malignant neoplasm of lymph nodes, site unspecified   . Shortness of breath     Past Surgical History:  Procedure Laterality Date  . AORTIC VALVE REPLACEMENT  08/02/2012   Procedure: AORTIC VALVE REPLACEMENT (AVR);  Surgeon: Melrose Nakayama, MD;  Location: [place];  Service: Open Heart Surgery;  Laterality: N/A;  . CARDIAC CATHETERIZATION  12-22-11   with OM1 Promus stent 12/22/11  . CORONARY ANGIOGRAM  07/30/2012   Procedure: CORONARY ANGIOGRAM;  Surgeon: Minus Breeding, MD;  [facility] CATH LAB;  Service: Cardiovascular;;  . CORONARY ARTERY BYPASS GRAFT  08/02/2012   Procedure: CORONARY ARTERY BYPASS GRAFTING (CABG);  Surgeon: Melrose Nakayama, MD;  Location: [place];  Service: Open Heart  Surgery;  Laterality: N/A;  . ENDOVEIN HARVEST OF GREATER SAPHENOUS VEIN  08/02/2012   Procedure: ENDOVEIN HARVEST OF GREATER SAPHENOUS VEIN;  Surgeon: Melrose Nakayama, MD;  Location: [place];  Service: Open Heart Surgery;  Laterality: Left;  upper and lower leg  . EYE SURGERY    . INTRAOPERATIVE TRANSESOPHAGEAL ECHOCARDIOGRAM  08/02/2012   Procedure: INTRAOPERATIVE TRANSESOPHAGEAL ECHOCARDIOGRAM;  Surgeon: Melrose Nakayama, MD;  Location: [place];  Service: Open Heart Surgery;  Laterality: N/A;  . KERATOTOMIES     BILATERAL  . knee arthoscopy     LEFT KNEE  . KNEE ARTHROSCOPY  06/28/2012   Procedure: ARTHROSCOPY KNEE;  Surgeon: Vickey Huger, MD;   Location: [place];  Service: Orthopedics;  Laterality: Left;  . knee surgery     left  . ROTATOR CUFF REPAIR     RIGHT  . SEPTOPLASTY    . TEE WITHOUT CARDIOVERSION  07/28/2012   Procedure: TRANSESOPHAGEAL ECHOCARDIOGRAM (TEE);  Surgeon: Thayer Headings, MD;  Location: [place];  Service: Cardiovascular;  Laterality: N/A;  . TONSILLECTOMY    . ULNAR TUNNEL RELEASE     RIGHT     Current Outpatient Medications  Medication Sig Dispense Refill  . amoxicillin (AMOXIL) 500 MG capsule Take 4 tablets (2,000mg ) by mouth one hour prior to dental work. 4 capsule 3  . finasteride (PROPECIA) 1 MG tablet Take 0.25 mg by mouth daily.   4  . JANUMET XR 50-1000 MG TB24 Take 2 tablets by mouth daily.  1  . losartan (COZAAR) 25 MG tablet Take 25 mg by mouth daily.    . pioglitazone (ACTOS) 30 MG tablet Take 30 mg by mouth daily.    . rosuvastatin (CRESTOR) 10 MG tablet Take 1 tablet (10 mg total) by mouth daily. 90 tablet 3   No current facility-administered medications for this visit.     Allergies:   Contrast media [iodinated diagnostic agents]; Other; and Gadolinium derivatives    Social History:  The patient  reports that he has never smoked. He has never used smokeless tobacco. He reports that he does not drink alcohol or use drugs.   Family History:  The patient's family history includes Coronary artery disease in his mother; Diabetes in his paternal grandmother; Heart attack in his paternal grandfather; Heart disease in his father; Hyperlipidemia in his brother and unknown relative; Lung cancer in his mother; Melanoma in his unknown relative; Migraines in his father.    ROS:  Please see the history of present illness.   Otherwise, review of systems are positive for migraine headaches.   All other systems are reviewed and negative.    PHYSICAL EXAM: VS:  BP 122/78   Pulse 89   Ht 5\' 9"  (1.753 m)   Wt 223 lb 3.2 oz (101.2 kg)   SpO2 97%   BMI 32.96 kg/m  , BMI Body mass index is 32.96  kg/m. GEN: Well nourished, well developed, in no acute distress  HEENT: normal  Neck: no JVD, carotid bruits, or masses Cardiac: RRR; 2/6 systolic murmurs, rubs, or gallops,no edema  Respiratory:  clear to auscultation bilaterally, normal work of breathing GI: soft, nontender, nondistended, + BS MS: no deformity or atrophy  Skin: warm and dry, no rash Neuro:  Strength and sensation are intact Psych: euthymic mood, full affect   EKG:   The ekg ordered in June 2018 demonstrates normal sinus rhythm with nonspecific ST-T wave flattening   Recent Labs: 12/22/2016: ALT 24; BUN 20; Creatinine,  Ser 1.20; Hemoglobin 12.6; Platelets 123; Potassium 4.3; Sodium 138 12/23/2016: TSH 3.143   Lipid Panel    Component Value Date/Time   CHOL 157 07/31/2012 0319   CHOL 105 07/01/2012 0019   TRIG 39 07/31/2012 0319   TRIG 47 07/01/2012 0019   HDL 54 07/31/2012 0319   HDL 35 (L) 07/01/2012 0019   CHOLHDL 2.9 07/31/2012 0319   VLDL 8 07/31/2012 0319   VLDL 9 07/01/2012 0019   [place] 95 07/31/2012 0319   LDLCALC 61 07/01/2012 0019     Other studies Reviewed: Additional studies/ records that were reviewed today with results demonstrating: 2018 echocardiogram reviewed, May 2018 LDL 104.   ASSESSMENT AND PLAN:  1. S/p AVR: Continue SBE prophylaxis.  Most recent echo showed valve was functioning well.  We discussed the longevity of his bioprosthetic valve.  If there are no issues, we will plan on rechecking an echocardiogram in about 4 years. 2. CAD: No clear anginal symptoms.  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  He is able to continue his active lifestyle without difficulties. 3. DM: A1c 7.8 in August 2018.  Given diabetes, LDL target should be below 100.  Last LDL was 104.  Increase Crestor to 10 mg daily for hyperlipidemia.  He will need a repeat cholesterol check with his primary care doctor and 2-3 months.  He is scheduled to see her  soon. 4. AI: Continue to follow with echocardiography.  No signs of heart failure.   Current medicines are reviewed at length with the patient today.  The patient concerns regarding his medicines were addressed.  The following changes have been made:    Labs/ tests ordered today include:  No orders of the defined types were placed in this encounter.   Recommend 150 minutes/week of aerobic exercise Low fat, low carb, high fiber diet recommended  Disposition:   FU in 1 year   Signed, Larae Grooms, MD  10/13/2017 1:20 PM    [facility] [place], [place], [place]  [address] Phone: [phone]; Fax: [phone]

## 2017-10-13 ENCOUNTER — Encounter: Payer: Self-pay | Admitting: Interventional Cardiology

## 2017-10-13 ENCOUNTER — Ambulatory Visit (INDEPENDENT_AMBULATORY_CARE_PROVIDER_SITE_OTHER): Payer: BLUE CROSS/BLUE SHIELD | Admitting: Interventional Cardiology

## 2017-10-13 VITALS — BP 122/78 | HR 89 | Ht 69.0 in | Wt 223.2 lb

## 2017-10-13 DIAGNOSIS — E1159 Type 2 diabetes mellitus with other circulatory complications: Secondary | ICD-10-CM

## 2017-10-13 DIAGNOSIS — E782 Mixed hyperlipidemia: Secondary | ICD-10-CM

## 2017-10-13 DIAGNOSIS — I25119 Atherosclerotic heart disease of native coronary artery with unspecified angina pectoris: Secondary | ICD-10-CM

## 2017-10-13 DIAGNOSIS — I351 Nonrheumatic aortic (valve) insufficiency: Secondary | ICD-10-CM | POA: Diagnosis not present

## 2017-10-13 MED ORDER — ROSUVASTATIN CALCIUM 10 MG PO TABS: 10.0000 mg | ORAL_TABLET | Freq: Every day | ORAL | 3 refills | Status: DC

## 2017-10-13 NOTE — Patient Instructions (Signed)
Medication Instructions:  Your physician has recommended you make the following change in your medication:   INCREASE: rosuvastatin (crestor) to 10 mg daily  Labwork: Your physician recommends that you have a FASTING lipid profile in 2-3 months with your primary care physician. Please have the fax the results to [phone].    Testing/Procedures: None ordered  Follow-Up: Your physician wants you to follow-up in: 1 year with Dr. Irish Lack. You will receive a reminder letter in the mail two months in advance. If you don't receive a letter, please call our office to schedule the follow-up appointment.   Any Other Special Instructions Will Be Listed Below (If Applicable).     If you need a refill on your cardiac medications before your next appointment, please call your pharmacy.

## 2018-03-02 ENCOUNTER — Other Ambulatory Visit (HOSPITAL_COMMUNITY): Payer: Self-pay | Admitting: Family Medicine

## 2018-03-02 DIAGNOSIS — G453 Amaurosis fugax: Secondary | ICD-10-CM

## 2018-03-03 ENCOUNTER — Other Ambulatory Visit: Payer: Self-pay | Admitting: Family Medicine

## 2018-03-03 DIAGNOSIS — G453 Amaurosis fugax: Secondary | ICD-10-CM

## 2018-03-05 ENCOUNTER — Other Ambulatory Visit: Payer: Self-pay | Admitting: Family Medicine

## 2018-03-05 ENCOUNTER — Ambulatory Visit (HOSPITAL_COMMUNITY): Payer: BLUE CROSS/BLUE SHIELD | Attending: Cardiology

## 2018-03-05 ENCOUNTER — Other Ambulatory Visit: Payer: Self-pay

## 2018-03-05 DIAGNOSIS — E785 Hyperlipidemia, unspecified: Secondary | ICD-10-CM | POA: Diagnosis not present

## 2018-03-05 DIAGNOSIS — I10 Essential (primary) hypertension: Secondary | ICD-10-CM | POA: Diagnosis not present

## 2018-03-05 DIAGNOSIS — Z953 Presence of xenogenic heart valve: Secondary | ICD-10-CM | POA: Diagnosis not present

## 2018-03-05 DIAGNOSIS — G453 Amaurosis fugax: Secondary | ICD-10-CM | POA: Insufficient documentation

## 2018-03-05 DIAGNOSIS — I2510 Atherosclerotic heart disease of native coronary artery without angina pectoris: Secondary | ICD-10-CM | POA: Diagnosis not present

## 2018-03-05 DIAGNOSIS — I252 Old myocardial infarction: Secondary | ICD-10-CM | POA: Diagnosis not present

## 2018-03-05 DIAGNOSIS — I081 Rheumatic disorders of both mitral and tricuspid valves: Secondary | ICD-10-CM | POA: Diagnosis not present

## 2018-03-05 MED ORDER — PERFLUTREN LIPID MICROSPHERE
1.0000 mL | INTRAVENOUS | Status: AC | PRN
  Administered 2018-03-05: 2 mL via INTRAVENOUS

## 2018-03-11 ENCOUNTER — Ambulatory Visit
Admission: RE | Admit: 2018-03-11 | Discharge: 2018-03-11 | Disposition: A | Discharge status: Home / Self Care | Payer: BLUE CROSS/BLUE SHIELD | Source: Ambulatory Visit | Attending: Family Medicine | Admitting: Family Medicine

## 2018-03-11 DIAGNOSIS — G453 Amaurosis fugax: Secondary | ICD-10-CM

## 2018-03-19 ENCOUNTER — Ambulatory Visit
Admission: RE | Admit: 2018-03-19 | Discharge: 2018-03-19 | Disposition: A | Discharge status: Home / Self Care | Payer: BLUE CROSS/BLUE SHIELD | Source: Ambulatory Visit | Attending: Family Medicine | Admitting: Family Medicine

## 2018-03-19 DIAGNOSIS — G453 Amaurosis fugax: Secondary | ICD-10-CM

## 2018-03-29 ENCOUNTER — Other Ambulatory Visit: Payer: BLUE CROSS/BLUE SHIELD

## 2018-09-15 DIAGNOSIS — G8929 Other chronic pain: Secondary | ICD-10-CM | POA: Insufficient documentation

## 2018-09-21 NOTE — Progress Notes (Signed)
Cardiology Office Note   Date:  09/22/2018   ID:  Roy [name], DOB [birth year], MRN [id number]  PCP:  Harlan Stains, MD    No chief complaint on file.  CAD  Wt Readings from Last 3 Encounters:  09/22/18 205 lb 6.4 oz (93.2 kg)  10/13/17 223 lb 3.2 oz (101.2 kg)  12/23/16 208 lb 15.9 oz (94.8 kg)       History of Present Illness: Roy [name] is a 58 y.o. male  who has a family h/o CAD. He had an MI in 6/13 with a stent placed, with jaw pain and chest pain. In 12/13, he had knee surgery and 2 days later, he had an MI. He had a stent placed at that time. 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 He feels that the valve was damaged during the cath and wanted a new cardiologist, so he saw me for the first time in 2014. He feels better now and had a tissue valve due to not wanting to take coumadin.  He had a question of TIA in June 2018.  It turned out to be a migraine per his report.  He had an echo at that time showing:.   Left ventricle: The cavity size was normal. There was moderate concentric hypertrophy. Systolic function was vigorous. The estimated ejection fraction was in the range of 65% to 70%. Wall motion was normal; there were no regional wall motion abnormalities. Doppler parameters are consistent with abnormal left ventricular relaxation (grade 1 diastolic dysfunction). - Aortic valve: Poorly visualized. Trileaflet; mildly thickened, mildly calcified leaflets. There was mild stenosis. Peak velocity (S): 253 cm/s. Mean gradient (S): 15 mm Hg. - Mitral valve: Moderately calcified annulus. - Left atrium: The atrium was mildly dilated.  Impressions:  - No cardiac source of emboli was indentified.  Denies : Chest pain. Dizziness. Leg edema. Nitroglycerin use. Orthopnea. Palpitations. Paroxysmal nocturnal dyspnea. Shortness of breath. Syncope.   He walks regularly at work.    He has decreased portion  sizes and lost weight over the last year.   Past Medical History:  Diagnosis Date  . Aortic insufficiency    a. 07/2012 s/p AVR: #25 Magnaease Bioprosthetic Valve (performed @ time of CABG).  . CAD (coronary artery disease)    a. 12/2011 NSTEMI/PCI: Stenting of the OM;  b. 06/2012 NSTEMI/PCI: LCX 100%->BMS;  c. 07/2012 Cath: dissectied LM & LCX with patent LCX/OM stents->CABG x 3: LIMA->LAD, VG->OM1->OM2.  . Diabetes mellitus    type II  . Diverticulitis   . Hyperlipidemia   . Hypertension   . Melanoma ([place])   . Renal disorder   . Secondary and unspecified malignant neoplasm of lymph nodes, site unspecified   . Shortness of breath     Past Surgical History:  Procedure Laterality Date  . AORTIC VALVE REPLACEMENT  08/02/2012   Procedure: AORTIC VALVE REPLACEMENT (AVR);  Surgeon: Melrose Nakayama, MD;  Location: [place];  Service: Open Heart Surgery;  Laterality: N/A;  . CARDIAC CATHETERIZATION  12-22-11   with OM1 Promus stent 12/22/11  . CORONARY ANGIOGRAM  07/30/2012   Procedure: CORONARY ANGIOGRAM;  Surgeon: Minus Breeding, MD;  Location: [facility] CATH LAB;  Service: Cardiovascular;;  . CORONARY ARTERY BYPASS GRAFT  08/02/2012   Procedure: CORONARY ARTERY BYPASS GRAFTING (CABG);  Surgeon: Melrose Nakayama, MD;  Location: [place];  Service: Open Heart Surgery;  Laterality: N/A;  . ENDOVEIN HARVEST OF GREATER  SAPHENOUS VEIN  08/02/2012   Procedure: ENDOVEIN HARVEST OF GREATER SAPHENOUS VEIN;  Surgeon: Melrose Nakayama, MD;  Location: [place];  Service: Open Heart Surgery;  Laterality: Left;  upper and lower leg  . EYE SURGERY    . INTRAOPERATIVE TRANSESOPHAGEAL ECHOCARDIOGRAM  08/02/2012   Procedure: INTRAOPERATIVE TRANSESOPHAGEAL ECHOCARDIOGRAM;  Surgeon: Melrose Nakayama, MD;  Location: [place];  Service: Open Heart Surgery;  Laterality: N/A;  . KERATOTOMIES     BILATERAL  . knee arthoscopy     LEFT KNEE  . KNEE ARTHROSCOPY  06/28/2012   Procedure: ARTHROSCOPY KNEE;  Surgeon: Vickey Huger, MD;  Location: [place];  Service: Orthopedics;  Laterality: Left;  . knee surgery     left  . ROTATOR CUFF REPAIR     RIGHT  . SEPTOPLASTY    . TEE WITHOUT CARDIOVERSION  07/28/2012   Procedure: TRANSESOPHAGEAL ECHOCARDIOGRAM (TEE);  Surgeon: Thayer Headings, MD;  Location: [place];  Service: Cardiovascular;  Laterality: N/A;  . TONSILLECTOMY    . ULNAR TUNNEL RELEASE     RIGHT     Current Outpatient Medications  Medication Sig Dispense Refill  . amoxicillin (AMOXIL) 500 MG capsule Take 4 tablets (2,000mg ) by mouth one hour prior to dental work. 4 capsule 3  . finasteride (PROPECIA) 1 MG tablet Take 0.25 mg by mouth daily.   4  . JANUMET XR 50-1000 MG TB24 Take 2 tablets by mouth daily.  1  . losartan (COZAAR) 25 MG tablet Take 12.5 mg by mouth daily. Take one half tablet by mouth daily.    . pioglitazone (ACTOS) 30 MG tablet Take 30 mg by mouth daily.    . rosuvastatin (CRESTOR) 10 MG tablet Take 1 tablet (10 mg total) by mouth daily. 90 tablet 3   No current facility-administered medications for this visit.     Allergies:   Contrast media [iodinated diagnostic agents]; Other; and Gadolinium derivatives    Social History:  The patient  reports that he has never smoked. He has never used smokeless tobacco. He reports that he does not drink alcohol or use drugs.   Family History:  The patient's family history includes Coronary artery disease in his mother; Diabetes in his paternal grandmother; Heart attack in his paternal grandfather; Heart disease in his father; Hyperlipidemia in his brother and unknown relative; Lung cancer in his mother; Melanoma in his unknown relative; Migraines in his father.    ROS:  Please see the history of present illness.   Otherwise, review of systems are positive for frequent urination from DM medicine.   All other systems are reviewed and negative.    PHYSICAL EXAM: VS:  BP 122/78   Pulse (!) 56   Ht 5\' 9"  (1.753 m)   Wt 205 lb 6.4 oz  (93.2 kg)   SpO2 99%   BMI 30.33 kg/m  , BMI Body mass index is 30.33 kg/m. GEN: Well nourished, well developed, in no acute distress  HEENT: normal  Neck: no JVD, carotid bruits, or masses Cardiac: RRR; 2/6 systolic murmurs, rubs, or gallops,no edema  Respiratory:  clear to auscultation bilaterally, normal work of breathing GI: soft, nontender, nondistended, + BS MS: no deformity or atrophy  Skin: warm and dry, no rash Neuro:  Strength and sensation are intact Psych: euthymic mood, full affect   EKG:   The ekg ordered today demonstrates NSR, inferior Q waves   Recent Labs: No results found for requested labs within last 8760 hours.  Lipid Panel    Component Value Date/Time   CHOL 157 07/31/2012 0319   CHOL 105 07/01/2012 0019   TRIG 39 07/31/2012 0319   TRIG 47 07/01/2012 0019   HDL 54 07/31/2012 0319   HDL 35 (L) 07/01/2012 0019   CHOLHDL 2.9 07/31/2012 0319   VLDL 8 07/31/2012 0319   VLDL 9 07/01/2012 0019   LDLCALC 95 07/31/2012 0319   LDLCALC 61 07/01/2012 0019     Other studies Reviewed: Additional studies/ records that were reviewed today with results demonstrating: labs reviewed.   ASSESSMENT AND PLAN:  1. S/p AVR: continue SBE prophylaxis.  No signs of CHF.  2. CAD: No angina.  LDL 100 at last check.  He has lost weight since that time.  He will have his labs rechecked with his primary care doctor.  If LDL is above 70, would need to increase his Crestor to 20 mg daily.  He is hesitant to do this now. 3. DM: A1C 8 in October 2019.  He has since lost weight.  He is compliant with his medicines.  We discussed healthy diet changes. 4. AI: No evidence of CHF.   Current medicines are reviewed at length with the patient today.  The patient concerns regarding his medicines were addressed.  The following changes have been made:  No change  Labs/ tests ordered today include:  No orders of the defined types were placed in this encounter.   Recommend 150  minutes/week of aerobic exercise Low fat, low carb, high fiber diet recommended  Disposition:   FU in 1 year   Signed, Larae Grooms, MD  09/22/2018 8:47 AM    [facility] [place], [place], [place]  [address] Phone: [phone]; Fax: [phone]

## 2018-09-22 ENCOUNTER — Ambulatory Visit (INDEPENDENT_AMBULATORY_CARE_PROVIDER_SITE_OTHER): Payer: BLUE CROSS/BLUE SHIELD | Admitting: Interventional Cardiology

## 2018-09-22 ENCOUNTER — Encounter: Payer: Self-pay | Admitting: Interventional Cardiology

## 2018-09-22 VITALS — BP 122/78 | HR 56 | Ht 69.0 in | Wt 205.4 lb

## 2018-09-22 DIAGNOSIS — I351 Nonrheumatic aortic (valve) insufficiency: Secondary | ICD-10-CM

## 2018-09-22 DIAGNOSIS — E1159 Type 2 diabetes mellitus with other circulatory complications: Secondary | ICD-10-CM

## 2018-09-22 DIAGNOSIS — Z952 Presence of prosthetic heart valve: Secondary | ICD-10-CM | POA: Diagnosis not present

## 2018-09-22 DIAGNOSIS — I25119 Atherosclerotic heart disease of native coronary artery with unspecified angina pectoris: Secondary | ICD-10-CM

## 2018-09-22 NOTE — Patient Instructions (Signed)

## 2018-11-25 ENCOUNTER — Other Ambulatory Visit: Payer: Self-pay | Admitting: Orthopedic Surgery

## 2018-11-25 DIAGNOSIS — M25512 Pain in left shoulder: Secondary | ICD-10-CM

## 2018-12-02 ENCOUNTER — Other Ambulatory Visit: Payer: BLUE CROSS/BLUE SHIELD

## 2018-12-20 HISTORY — PX: SHOULDER ARTHROSCOPY: SHX128

## 2019-01-04 DIAGNOSIS — Z9889 Other specified postprocedural states: Secondary | ICD-10-CM | POA: Insufficient documentation

## 2019-03-07 ENCOUNTER — Telehealth: Payer: Self-pay | Admitting: Interventional Cardiology

## 2019-03-07 MED ORDER — PRAVASTATIN SODIUM 20 MG PO TABS: 20.0000 mg | ORAL_TABLET | Freq: Every evening | ORAL | 3 refills | Status: DC

## 2019-03-07 NOTE — Telephone Encounter (Signed)
Pt c/o medication issue:  1. Name of Medication:rosuvastatin (CRESTOR) 10 MG tablet  2. How are you currently taking this medication (dosage and times per day)? Take 1 tablet (10 mg total) by mouth daily.  3. Are you having a reaction (difficulty breathing--STAT)? no  4. What is your medication issue? Patient states the medication is giving him leg cramps and numbness. He wants to know if there is something else he can try.

## 2019-03-07 NOTE — Telephone Encounter (Signed)
Called and spoke with patient. Will do a drug holiday and then start pravastatin 20mg  daily once he feels better. Will call patient in 2 months to follow up.   Per patient he did not tolerate rosuvastatin 5mg  well.

## 2019-03-07 NOTE — Telephone Encounter (Signed)
Pt is calling in to inform Dr Irish Lack that he cannot tolerate taking rosuvastatin anymore.  Pt reports that his statin is causing lower extremity muscle aches and cramping, as well as causing him to have bilateral numbness at times.  Pt states he did take himself off of this med starting today.  Pt would like for Dr Irish Lack to advise on a different regimen if possible.  Informed the pt that Dr.Varanasi and RN are out of the office at this time, but I will route this concern to them for further review, recommendation, and someone from the office will follow-up with him shortly thereafter.  Pt verbalized understanding and agrees with this plan.

## 2019-03-07 NOTE — Telephone Encounter (Signed)
WIll refer to [facility] to see if another statin or consideration of PCSK9 would be reasonable.

## 2019-03-08 NOTE — Addendum Note (Signed)
Addended by: Drue Novel I on: 03/08/2019 08:41 AM   Modules accepted: Orders

## 2019-05-02 ENCOUNTER — Telehealth: Payer: Self-pay | Admitting: Pharmacist

## 2019-05-02 NOTE — Telephone Encounter (Signed)
Called patient to follow up on how he is doing with pravastatin. Patient states that he is tolerating better than with the rosuvastatin. He states that he gets cramps in his stomach and feels off balance. Cramps in stomach sound more related to his diabetes and or diabetes medications (possibly diabetic gastroparesis). I would not think statins would cause issue with balance. Patient has already cut dose in half to see if these would improve. Suggested that he hold statin for 2 weeks.  I will call him in 2 weeks to follow up to see if there is any improvement in symptoms off medication. If no improvement should resume statin therapy. If there is improvement, will need to disucss PCSK9 inhibitors

## 2019-05-09 ENCOUNTER — Telehealth: Payer: Self-pay | Admitting: Interventional Cardiology

## 2019-05-09 NOTE — Telephone Encounter (Signed)
Pt c/o of Chest Pain: STAT if CP now or developed within 24 hours  1. Are you having CP right now? No   2. Are you experiencing any other symptoms (ex. SOB, nausea, vomiting, sweating)? Numbness left arm and hand that comes and goes   3. How long have you been experiencing CP? 5 days ago   4. Is your CP continuous or coming and going? Comes and goes   5. Have you taken Nitroglycerin? No.    Patient does think it might be his diabetes medication that can be causing this.  ?

## 2019-05-09 NOTE — Telephone Encounter (Signed)
I spoke to the patient who has been having 3/10 CP, which comes and goes.  His left arm gets numb, but no pain.  He thinks that it is more muscle pain going over the chest, middle, right to left.  His BP has been 124/89, no HR noted.  I told him that we would reach out to get him in the office within the next week, but if things worsen, he should go to the ED.  He verbalized understanding.

## 2019-05-13 ENCOUNTER — Telehealth: Payer: Self-pay | Admitting: Interventional Cardiology

## 2019-05-13 NOTE — Telephone Encounter (Signed)
Called and spoke to patient. He states that he had been having intermittent episodes of chest pain that he rates 3/10 and left arm numbness. He states that this happens with activity and at rest. He states that it usually lasts for 10-15 minutes and goes away on its own. He states that he has had some occasional dizziness. Denies SOB, syncope, radiation, diaphoresis, or any other Sx. Does not have an RX for NTG. BP 124/85. He states that he thought his Sx were related to his pravastatin, so he stopped it. He states that he has not had any discomfort in the last 4-5 days. Patient with Hx of MI with a stent in 2013 and s/p AVR and CABG in 2014. Made patient aware that he should restart his cholesterol medicine. He was discussing with PharmD over the phone about other options but wants Dr. Hassell Done opinion. He is not really interested in injections. Also patient is not on ASA. Will forward to Dr. Irish Lack for review and recommendation.

## 2019-05-13 NOTE — Telephone Encounter (Signed)
New message     Called patient to schedule an appt because he was c/o mild chest pain and left arm numbness.  He states that he stopped taking the cholesterol medication (did not know name of rx) a week ago and for the last 4 days, he has had no chest pain or arm numbness.  He said he think it was from the medication.  He thinks the medication was for cholesterol.  He said it was the most recent prescribed medication.  Patient declined to schedule an appt but wanted to know if he should stay off medication or will the doctor prescribe something else?  Please advise.

## 2019-05-16 ENCOUNTER — Encounter: Payer: Self-pay | Admitting: Physician Assistant

## 2019-05-16 NOTE — Telephone Encounter (Signed)
I think he should be on ASA 81 mg daily.  Also, we can try low dose rosuvastatin 10 mg daily.  If he has more sx, would plan exercise myoview

## 2019-05-16 NOTE — Progress Notes (Signed)
Cardiology Office Note    Date:  05/18/2019   ID:  Roy [name], DOB 01-30-1961, MRN [id number]  PCP:  Harlan Stains, MD  Cardiologist:  Larae Grooms, MD  Electrophysiologist:  None   Chief Complaint: shooting chest pains and left arm numbness  History of Present Illness:   Roy [name] is a 58 y.o. male with history of CAD s/p prior PCI and CABG as below, AI s/p biosprosthetic AVR in 07/2012, moderate mitral regurgitation by echo 02/2018, DM, HTN, HLD (followed by PCP), diverticulitis, melanoma, vertigo who presents for evaluation of chest discomfort and left arm numbness.   He had an NSTEMI in 12/2011 s/p stent to the OM. In December 2013 he had knee surgery and 2 days later he had an MI. He required stenting of the totally occluded circumflex. Following his MI, he continued to have dyspnea and underwent echocardiogram and then transesophageal echo in January 2014 showing severe aortic insufficiency. 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 Cardiac CT angiography was performed to evaluate for dissection and did not show any aortic or LM dissection. The case was reviewed with thoracic surgery and given severe aortic insufficiency, he ultimately underwent successful bioprosthetic aortic valve replacement along with CABG x3. Bioprosthetic valve was chosen because the patient did not wish to take Coumadin long term. He had a question of TIA in June 2018 but it turned out to be a complicated migraine per report. He reports having followed with neurology for these episodes. Last echo 02/2018 showed EF 55-60%, grade 1 DD, normally functioning AVR, moderate MR, mildly increased PASP. Carotid duplex at that time showed 1-49% stenosis. Last labs 11/2018 showed normal TSH, LDL 92, Trig 66, Hgb 14.6, K 4.5, Cr 1.22, normal LFTs.  He returns today and includes discussion of the following symptoms: - intermittent sharp  shooting chest pains for the last few weeks, lasting 2-3 seconds described as a tremor across chest wall. This was happening daily but then he stopped pravastatin and it went down to only 2-3 episodes in the last week. He has felt pretty good the past few days. 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 Works long hours, too. No exertional CP, dyspnea. No associated sx with sharp shooting CP. Did have episode of L arm numbness about 1 week ago lasting 10 minutes, no provoking factors, preserved mobility, no other neurologic symptoms or associated cardiac symptoms. Resolved spontaneously and did not recur. Was not associated with the CP. He wonders about potential neck issue as he also has chronic shoulder issues on that side and has to wear a heating pad. - longstanding history (years) of spontaneous onset of dizziness, lasting a few seconds, where he feels woozy for a moment. Previously told he had vertigo. Not associated with any other sx including CP, palpitations, syncope. At first he mentioned it feels like he may pass out but later in conversation it is more just a transient dizziness. We discussed further testing for this but he would like to hold off. He believes his symptoms have been traditionally related to medication changes. - has h/o complicated migraines and follows with neurology - includes complex features including transient speech difficulties. These have improved over the year.   Past Medical History:  Diagnosis Date  . Aortic insufficiency    a. 07/2012 s/p AVR: #25 Magnaease Bioprosthetic Valve (performed @ time of  CABG).  . CAD (coronary artery disease)    a. 12/2011 NSTEMI/PCI: Stenting of the OM;  b. 06/2012 NSTEMI/PCI: LCX 100%->BMS;  c. 07/2012 Cath: dissectied LM & LCX with patent LCX/OM stents->CABG x 3: LIMA->LAD, VG->OM1->OM2.  . Diabetes mellitus    type II  . Diverticulitis   .  Hyperlipidemia   . Hypertension   . Melanoma ([place])   . Moderate mitral regurgitation   . Renal disorder   . Secondary and unspecified malignant neoplasm of lymph nodes, site unspecified     Past Surgical History:  Procedure Laterality Date  . AORTIC VALVE REPLACEMENT  08/02/2012   Procedure: AORTIC VALVE REPLACEMENT (AVR);  Surgeon: Melrose Nakayama, MD;  Location: [place];  Service: Open Heart Surgery;  Laterality: N/A;  . CARDIAC CATHETERIZATION  12-22-11   with OM1 Promus stent 12/22/11  . CORONARY ANGIOGRAM  07/30/2012   Procedure: CORONARY ANGIOGRAM;  Surgeon: Minus Breeding, MD;  Location: [facility] C[facility] LAB;  Service: Cardiovascular;;  . CORONARY ARTERY BYPASS GRAFT  08/02/2012   Procedure: CORONARY ARTERY BYPASS GRAFTING (CABG);  Surgeon: Melrose Nakayama, MD;  Location: [place];  Service: Open Heart Surgery;  Laterality: N/A;  . ENDOVEIN HARVEST OF GREATER SAPHENOUS VEIN  08/02/2012   Procedure: ENDOVEIN HARVEST OF GREATER SAPHENOUS VEIN;  Surgeon: Melrose Nakayama, MD;  Location: [place];  Service: Open Heart Surgery;  Laterality: Left;  upper and lower leg  . EYE SURGERY    . INTRAOPERATIVE TRANSESOPHAGEAL ECHOCARDIOGRAM  08/02/2012   Procedure: INTRAOPERATIVE TRANSESOPHAGEAL ECHOCARDIOGRAM;  Surgeon: Melrose Nakayama, MD;  Location: [place];  Service: Open Heart Surgery;  Laterality: N/A;  . KERATOTOMIES     BILATERAL  . knee arthoscopy     LEFT KNEE  . KNEE ARTHROSCOPY  06/28/2012   Procedure: ARTHROSCOPY KNEE;  Surgeon: Vickey Huger, MD;  Location: [place];  Service: Orthopedics;  Laterality: Left;  . knee surgery     left  . ROTATOR CUFF REPAIR     RIGHT  . SEPTOPLASTY    . TEE WITHOUT CARDIOVERSION  07/28/2012   Procedure: TRANSESOPHAGEAL ECHOCARDIOGRAM (TEE);  Surgeon: Thayer Headings, MD;  Location: [place];  Service: Cardiovascular;  Laterality: N/A;  . TONSILLECTOMY    . ULNAR TUNNEL RELEASE     RIGHT    Current Medications: Current Meds  Medication Sig  .  amoxicillin (AMOXIL) 500 MG capsule Take 4 tablets (2,000mg ) by mouth one hour prior to dental work.  . finasteride (PROPECIA) 1 MG tablet Take 0.25 mg by mouth daily.   Marland Kitchen glipiZIDE (GLUCOTROL) 5 MG tablet Take 5 mg by mouth daily.  Marland Kitchen JARDIANCE 25 MG TABS tablet Take 25 mg by mouth every evening.  Marland Kitchen losartan (COZAAR) 25 MG tablet Take 12.5 mg by mouth daily. Take one half tablet by mouth daily.  . meloxicam (MOBIC) 15 MG tablet Take 15 mg by mouth as needed.  . metFORMIN (GLUCOPHAGE-XR) 500 MG 24 hr tablet Take 1,000 mg by mouth 2 (two) times daily.  . pioglitazone (ACTOS) 30 MG tablet Take 30 mg by mouth daily.  . RYBELSUS 7 MG TABS Take 7 mg by mouth daily.      Allergies:   Contrast media [iodinated diagnostic agents], Other, and Gadolinium derivatives   Social History   Socioeconomic History  . Marital status: Married    Spouse name: Jymmi Gylln  . Number of children: 3  . Years of education: 46  . Highest education level: Not on file  Occupational History  . Occupation: [employer]  Social Needs  . Financial resource strain: Not on file  . Food insecurity    Worry: Not on file    Inability: Not on file  . Transportation needs    Medical: Not on file    Non-medical: Not on file  Tobacco Use  . Smoking status: Never Smoker  . Smokeless tobacco: Never Used  Substance and Sexual Activity  . Alcohol use: No  . Drug use: No  . Sexual activity: Not on file  Lifestyle  . Physical activity    Days per week: Not on file    Minutes per session: Not on file  . Stress: Not on file  Relationships  . Social Herbalist on phone: Not on file    Gets together: Not on file    Attends religious service: Not on file    Active member of club or organization: Not on file    Attends meetings of clubs or organizations: Not on file    Relationship status: Not on file  Other Topics Concern  . Not on file  Social History Narrative   Lives with wife   Caffeine use: 1 cup /day      Family History:  The patient's family history includes Coronary artery disease in his mother; Diabetes in his paternal grandmother; Heart attack in his paternal grandfather; Heart disease in his father; Hyperlipidemia in his brother and unknown relative; Lung cancer in his mother; Melanoma in his unknown relative; Migraines in his father.  ROS:   Please see the history of present illness.  All other systems are reviewed and otherwise negative.    EKGs/Labs/Other Studies Reviewed:    Studies reviewed were summarized above.   EKG:  EKG is ordered today, personally reviewed, demonstrating NSR 73bpm, no acute STT Changes  Recent Labs: No results found for requested labs within last 8760 hours.  Recent Lipid Panel    Component Value Date/Time   CHOL 157 07/31/2012 0319   CHOL 105 07/01/2012 0019   TRIG 39 07/31/2012 0319   TRIG 47 07/01/2012 0019   HDL 54 07/31/2012 0319   HDL 35 (L) 07/01/2012 0019   CHOLHDL 2.9 07/31/2012 0319   VLDL 8 07/31/2012 0319   VLDL 9 07/01/2012 0019   LDLCALC 95 07/31/2012 0319   LDLCALC 61 07/01/2012 0019    PHYSICAL EXAM:    VS:  BP 128/78   Pulse 75   Ht 5\' 9"  (1.753 m)   Wt 200 lb 12.8 oz (91.1 kg)   SpO2 96%   BMI 29.65 kg/m   BMI: Body mass index is 29.65 kg/m.  GEN: Well nourished, well developed WM, in no acute distress HEENT: normocephalic, atraumatic Neck: no JVD, carotid bruits, or masses Cardiac: RRR; no murmurs, rubs, or gallops, no edema  Respiratory:  clear to auscultation bilaterally, normal work of breathing GI: soft, nontender, nondistended, + BS MS: no deformity or atrophy Skin: warm and dry, no rash Neuro:  Alert and Oriented x 3, Strength and sensation are intact, follows commands Psych: euthymic mood, full affect  Wt Readings from Last 3 Encounters:  05/18/19 200 lb 12.8 oz (91.1 kg)  09/22/18 205 lb 6.4 oz (93.2 kg)  10/13/17 223 lb 3.2 oz (101.2 kg)     ASSESSMENT & PLAN:   1. Atypical chest pain - does  not sound like angina. He is able to exert himself without any concerning symptoms. Symptoms have begun to  improve with discontinuation of pravastatin. EKG is stable. Question MSK vs neuropathic pain. Left arm numbness has not recurred and was not associated with activity. Discussed surveillance for worsening symptoms, particularly if occurring with exertion. He will notify us of such. Restart ASA 81mg  daily. He's not sure why he stopped this but is willing to resume. 2. Dizziness - unclear etiology, occurs transiently. Discussed option of further workup but he wishes to defer any further cardiac testing at this time. Per our discussion he did not think episodes happened even often enough to captures on a 30 day monitor. Will also check Hgb with labs to ensure stability since we are starting aspirin. I see MA put this in as Hgb A1C but I have reached out to have her touch base with the lab to make sure patient does not get charged for it. 3. CAD s/p PCI/CABG - as above, restart ASA. He has had side effects to both rosuvastatin and pravastatin now, even low doses. It appears pharmDs have been informally involved on phone notes but I do not see a formal consultation with lipid clinic. Will refer. 4. Aortic insufficiency s/p AVR with residual moderate mitral regurgtation - SBE ppx reviewed. Echocardiogram last year was unrevealing. He will be due for a repeat echo at maximum by 02/2020 given moderate MR. In speaking with him about other symptoms he expresses a desire to defer further cardiac testing at present time and will keep [place] apprised of any new clinical changes. Resume aspirin. 5. Essential HTN - well controlled. Continue present regimen. 6. Mild carotid artery disease - 1-49% by duplex in 02/2018. Discussed updating study given migraine hx but he wishes to defer at this time.  Disposition: F/u with Dr. Irish Lack for routine annual follow-up 09/2019, sooner if needed. Refer to lipid clinic as above.   Medication Adjustments/Labs and Tests Ordered: Current medicines are reviewed at length with the patient today.  Concerns regarding medicines are outlined above. Medication changes, Labs and Tests ordered today are summarized above and listed in the Patient Instructions accessible in Encounters.   Signed, Charlie Pitter, PA-C  05/18/2019 9:35 AM    [facility] [place], [place], [place]  [address] Phone: [phone]; Fax: [phone]

## 2019-05-17 NOTE — Telephone Encounter (Signed)
Left message for patient to call back  

## 2019-05-17 NOTE — Telephone Encounter (Signed)
Follow Up ° °Patient is returning call. Please give patient a call back.  °

## 2019-05-18 ENCOUNTER — Encounter: Payer: Self-pay | Admitting: Physician Assistant

## 2019-05-18 ENCOUNTER — Other Ambulatory Visit: Payer: Self-pay

## 2019-05-18 ENCOUNTER — Ambulatory Visit (INDEPENDENT_AMBULATORY_CARE_PROVIDER_SITE_OTHER): Payer: BC Managed Care – PPO | Admitting: Physician Assistant

## 2019-05-18 VITALS — BP 128/78 | HR 75 | Ht 69.0 in | Wt 200.8 lb

## 2019-05-18 DIAGNOSIS — I10 Essential (primary) hypertension: Secondary | ICD-10-CM

## 2019-05-18 DIAGNOSIS — I2510 Atherosclerotic heart disease of native coronary artery without angina pectoris: Secondary | ICD-10-CM

## 2019-05-18 DIAGNOSIS — I340 Nonrheumatic mitral (valve) insufficiency: Secondary | ICD-10-CM

## 2019-05-18 DIAGNOSIS — I351 Nonrheumatic aortic (valve) insufficiency: Secondary | ICD-10-CM | POA: Diagnosis not present

## 2019-05-18 DIAGNOSIS — R0789 Other chest pain: Secondary | ICD-10-CM | POA: Diagnosis not present

## 2019-05-18 DIAGNOSIS — I6523 Occlusion and stenosis of bilateral carotid arteries: Secondary | ICD-10-CM

## 2019-05-18 DIAGNOSIS — R42 Dizziness and giddiness: Secondary | ICD-10-CM

## 2019-05-18 DIAGNOSIS — I779 Disorder of arteries and arterioles, unspecified: Secondary | ICD-10-CM

## 2019-05-18 LAB — BASIC METABOLIC PANEL
BUN/Creatinine Ratio: 13 (ref 9–20)
BUN: 12 mg/dL (ref 6–24)
CO2: 23 mmol/L (ref 20–29)
Calcium: 9.5 mg/dL (ref 8.7–10.2)
Chloride: 102 mmol/L (ref 96–106)
Creatinine, Ser: 0.95 mg/dL (ref 0.76–1.27)
GFR calc Af Amer: 102 mL/min/{1.73_m2} (ref >59)
GFR calc non Af Amer: 88 mL/min/{1.73_m2} (ref >59)
Glucose: 175 mg/dL — ABNORMAL HIGH (ref 65–99)
Potassium: 4.6 mmol/L (ref 3.5–5.2)
Sodium: 138 mmol/L (ref 134–144)

## 2019-05-18 LAB — MAGNESIUM: Magnesium: 1.9 mg/dL (ref 1.6–2.3)

## 2019-05-18 LAB — HEMOGLOBIN: Hemoglobin: 14.2 g/dL (ref 13.0–17.7)

## 2019-05-18 MED ORDER — ASPIRIN EC 81 MG PO TBEC: 81.0000 mg | DELAYED_RELEASE_TABLET | Freq: Every day | ORAL | 3 refills | Status: AC

## 2019-05-18 NOTE — Patient Instructions (Addendum)
  Medication Instructions:  Your physician has recommended you make the following change in your medication:  1.  RESTART Aspirin 81 mg daily  *If you need a refill on your cardiac medications before your next appointment, please call your pharmacy*  Lab Work: TODAY:  BMET, MAGNESIUM, & HGB  If you have labs (blood work) drawn today and your tests are completely normal, you will receive your results only by: Marland Kitchen MyChart Message (if you have MyChart) OR . A paper copy in the mail If you have any lab test that is abnormal or we need to change your treatment, we will call you to review the results.  Testing/Procedures: None ordered  You have been referred to Lipid Clinic for Cholesterol Management    Follow-Up: At [facility], you and your health needs are our priority.  As part of our continuing mission to provide you with exceptional heart care, we have created designated Provider Care Teams.  These Care Teams include your primary Cardiologist (physician) and Advanced Practice Providers (APPs -  Physician Assistants and Nurse Practitioners) who all work together to provide you with the care you need, when you need it.  Your next appointment:   5 months  The format for your next appointment:   In Person  Provider:   You may see Larae Grooms, MD or one of the following Advanced Practice Providers on your designated Care Team:    Melina Copa, PA-C  Ermalinda Barrios, PA-C   Other Instructions  Endocarditis Information  You may be at risk for developing endocarditis since you have  an artificial heart valve  or a repaired heart valve. Endocarditis is an infection of the lining of the heart or heart valves.  Certain surgical and dental procedures may put you at risk,  such as teeth cleaning or other dental procedures or any surgery involving the respiratory, urinary, gastrointestinal tract, gallbladder or prostate.  Notify your doctor or dentist before having any invasive  procedures. You will need to take antibiotics before certain procedures.  To prevent endocarditis, maintain good oral health. Seek prompt medical attention for any mouth/gum, skin or urinary tract infections.

## 2019-05-18 NOTE — Telephone Encounter (Signed)
Patient had OV with Melina Copa, PA today. Issues addressed at this visit.

## 2019-05-18 NOTE — Addendum Note (Signed)
Addended by: Gaetano Net on: 05/18/2019 01:26 PM   Modules accepted: Orders

## 2019-05-25 ENCOUNTER — Ambulatory Visit: Payer: BC Managed Care – PPO

## 2019-05-26 ENCOUNTER — Ambulatory Visit (INDEPENDENT_AMBULATORY_CARE_PROVIDER_SITE_OTHER): Payer: BC Managed Care – PPO | Admitting: Pharmacist

## 2019-05-26 ENCOUNTER — Other Ambulatory Visit: Payer: Self-pay

## 2019-05-26 DIAGNOSIS — E782 Mixed hyperlipidemia: Secondary | ICD-10-CM | POA: Diagnosis not present

## 2019-05-26 DIAGNOSIS — E1159 Type 2 diabetes mellitus with other circulatory complications: Secondary | ICD-10-CM

## 2019-05-26 NOTE — Patient Instructions (Addendum)
Thank you for visiting [place] today!   Continue pravastatin 20 mg daily to lower LDL (bad cholesterol).  Goal LDL <55-70. Current LDL 92 Please have Dr. Dema Severin check your cholesterol at next visit and have her send [place] the results. Fax number [phone]  Medications to consider if LDL not at goal  [place]  If you have any further side effects with your medications, please give [place] a call at [phone]

## 2019-05-26 NOTE — Progress Notes (Signed)
Patient ID: Roy [name]                 DOB: 06/23/1961                    MRN: [id number]     HPI: Roy [name] is a 58 y.o. male patient of Dr. Irish Lack, referred to lipid clinic by Melina Copa, PA. PMH is significant for HTN, CAD s/p percutaneous coronary angioplasty, aortic insufficiency s/p AVR (2014), MI (2013), DM, HLD, migraine. Patient has failed trials of multiple statin agents over several years for his HLD. Most recently he reported leg cramps and numbness with rosuvastatin in 03/07/2019. The issue was then referred to pharmacy who followed-up with the patient over the phone and switched him to pravastatin after completing a drug holiday. During 30-month phone f/u (05/02/2019), patient stated he was tolerating the pravastatin better than the rosuvastatin, but was endorsing stomach cramps and feeling off balance. This was thought to be related to his diabetes medications rather than statins, but patient was advised to hold statin for 2 weeks. Patient called on 10/19 with complaints of intermittent episodes of chest pain that he rates 3/10 and left arm numbness. He stated that this happens with activity and at rest, and usually lasts for 10-15 minutes and goes away on its own. Endorses occasional dizziness but denies SOB, syncope, or other effects. Patient was contacted on 10/23 to schedule appointment due to recent symptoms, and at that time patient reported he had had no chest pain, arm numbness, or discomfort the last 4-5 days after stopping pravastatin a week ago. He saw Melina Copa, PA on 10/28 and stated his intermittent sharp shooting chest pain lasting 2-3 seconds reduced to only 2-3 episodes/week once he stopped taking pravastatin. Following this visit, he restarted pravastatin and was referred to the lipid clinic to more closely address his HLD and statin intolerance.   Patient presents today in good spirits but concerned about his medications. Feels that something is wrong with his  medications but things have gotten confusing to him as to which might be causing his issues. Reports itching of hands and legs which started when Rybelsus was added. Also had stomach cramping but has resolved at this point. Then "phantom arm pains" when metformin was started, which he said started to go away when he cut down on his Crestor. Further endorses the intermittent chest/arm shocking pain which has reduced since stopping the pravastatin. Patient states he has resumed pravastatin about a week ago. He was more under the impression he was here to discuss his medications to make sure he isn't on too much or they don't interact with each other.   Current Lipid Medications: Pravstatin 20 mg  Intolerances: Atorvastatin 20/40 mg, Rosuvastatin 11/28/18 mg (muscle cramps, pain),  Risk Factors: Clinical ASCVD, DM, HTN,  LDL goal: <55  Diet: Drinks light juice, unsweet tea, regular pepsi (maybe 1 a day or every other day). Does not particularly watch what he eats.   Exercise: physical with work, works for [name]  Family History: The patient's family history includes Coronary artery disease in his mother; Diabetes in his paternal grandmother; Heart attack in his paternal grandfather; Heart disease in his father; Hyperlipidemia in his brother and unknown relative; Lung cancer in his mother; Melanoma in his unknown relative; Migraines in his father.  Social History: Lives with wife   Labs: (5/19) - total 158, TG 66, LDL 92, HDl 53 Past Medical History:  Diagnosis Date  . Aortic insufficiency    a. 07/2012 s/p AVR: #25 Magnaease Bioprosthetic Valve (performed @ time of CABG).  . CAD (coronary artery disease)    a. 12/2011 NSTEMI/PCI: Stenting of the OM;  b. 06/2012 NSTEMI/PCI: LCX 100%->BMS;  c. 07/2012 Cath: dissectied LM & LCX with patent LCX/OM stents->CABG x 3: LIMA->LAD, VG->OM1->OM2.  . Diabetes mellitus    type II  . Diverticulitis   . Hyperlipidemia   . Hypertension   .  Melanoma ([place])   . Moderate mitral regurgitation   . Renal disorder   . Secondary and unspecified malignant neoplasm of lymph nodes, site unspecified     Current Outpatient Medications on File Prior to Visit  Medication Sig Dispense Refill  . amoxicillin (AMOXIL) 500 MG capsule Take 4 tablets (2,000mg ) by mouth one hour prior to dental work. 4 capsule 3  . aspirin EC 81 MG tablet Take 1 tablet (81 mg total) by mouth daily. 90 tablet 3  . finasteride (PROPECIA) 1 MG tablet Take 0.25 mg by mouth daily.   4  . glipiZIDE (GLUCOTROL) 5 MG tablet Take 5 mg by mouth daily.    Marland Kitchen JARDIANCE 25 MG TABS tablet Take 25 mg by mouth every evening.    Marland Kitchen losartan (COZAAR) 25 MG tablet Take 12.5 mg by mouth daily. Take one half tablet by mouth daily.    . meloxicam (MOBIC) 15 MG tablet Take 15 mg by mouth as needed.    . metFORMIN (GLUCOPHAGE-XR) 500 MG 24 hr tablet Take 1,000 mg by mouth 2 (two) times daily.    . pioglitazone (ACTOS) 30 MG tablet Take 30 mg by mouth daily.    . RYBELSUS 7 MG TABS Take 7 mg by mouth daily.     No current facility-administered medications on file prior to visit.     Allergies  Allergen Reactions  . Contrast Media [Iodinated Diagnostic Agents] Other (See Comments)    Pt'shad streaks up and down his arms  . Other     Animal hair  . Gadolinium Derivatives Itching and Swelling    MultiHance adminstered. Itching of eyes, ears, throat. Tongue thickness.    Assessment/Plan:  1. Hyperlipidemia - Patient's LDL remains above goal <55 at 92. We do not have an LDL on pravastatin. Requested patient get lipid labs in 2 months. He states he will be seeing his PCP soon and prefers to do the labs there. Continue pravastatin 20 mg for further LDL lowering/cardiovascular risk reduction, but monitor closely for further side effects. We discuss the fact that I do not think his shooting pains in his chest are from the pravastatin. Myalgias from statins are usually bilateral. We did discus  future options including PCSK-9 inhibitor (patient against injections), or Nexlizet (bempedoic acid/ezetimibe). Did have an honest discussion with patient that pravastatin will probably not be enough to get his LDL to goal, but discussed the importance of statin therapy on plaque stabilization. Patient did not appear ready to start any new medications, therefore will continue pravastatin 20mg  daily and readdress once follow up labs are obtained.  2. Diabetes- Discussed diabetes medications with patient. Discussed that metformin and Rybelsus can cause stomach upset, but are usually self resolving. Cautioned use with glipizide as it can increase risk of hypoglycemia. Since patient is against using insulin, I do not think his current medication regimen is unreasonable. I did try to explain that having to use insulin does not mean failure. Advised that his current medications do not interact  with each other. Provided some dietary counseling around soda and other sugary drinks.   Ramond Dial, Pharm.D, [place]  [id number] [place], [place] [address]  Phone: [phone]; Fax: [phone]

## 2019-08-02 ENCOUNTER — Telehealth: Payer: Self-pay

## 2019-08-02 DIAGNOSIS — E785 Hyperlipidemia, unspecified: Secondary | ICD-10-CM

## 2019-08-02 NOTE — Telephone Encounter (Signed)
lmomed the pt asking them to call us back so that we can schedule lipids. Will await callback

## 2019-09-27 ENCOUNTER — Ambulatory Visit: Payer: BC Managed Care – PPO | Admitting: Interventional Cardiology

## 2019-11-11 NOTE — Progress Notes (Signed)
Cardiology Office Note   Date:  11/14/2019   ID:  Roy [name], DOB July 11, 1961, MRN [id number]  PCP:  Harlan Stains, MD    No chief complaint on file.  CAD  Wt Readings from Last 3 Encounters:  11/14/19 206 lb 9.6 oz (93.7 kg)  05/18/19 200 lb 12.8 oz (91.1 kg)  09/22/18 205 lb 6.4 oz (93.2 kg)       History of Present Illness: Roy [name] is a 59 y.o. male   who has a family h/o CAD. He had an MI in 6/13 with a stent placed, with jaw pain and chest pain. In 12/13, he had knee surgery and 2 days later, he had an MI. He had a stent placed at that time. 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 He feels that the valve was damaged during the cath and wanted a new cardiologist, so he saw me for the first time in 2014. He feels better now and had a tissue valve due to not wanting to take coumadin.  He had a question of TIA in June 2018. It turned out to be a migraine per his report. He had an echo at that time showing:.   Left ventricle: The cavity size was normal. There was moderate concentric hypertrophy. Systolic function was vigorous. The estimated ejection fraction was in the range of 65% to 70%. Wall motion was normal; there were no regional wall motion abnormalities. Doppler parameters are consistent with abnormal left ventricular relaxation (grade 1 diastolic dysfunction). - Aortic valve: Poorly visualized. Trileaflet; mildly thickened, mildly calcified leaflets. There was mild stenosis. Peak velocity (S): 253 cm/s. Mean gradient (S): 15 mm Hg. - Mitral valve: Moderately calcified annulus. - Left atrium: The atrium was mildly dilated.  Impressions:  - No cardiac source of emboli was indentified.  Lost weight through decreased portion sizes.   Had some atypical chest/arm pains in late 2020.  Medically managed.  Since the last visit, he has had vertigo.  He feels it daily, in the afternoons.  He does  stay hydrated.  He takes jardiance and Rybelsus make him urinate a lot.    Also has a rash on arms and legs, when exposed to sun.  Improved after stopping glipizide.    Treadmill, weights at home and job is strenuous.  No cardiac sx with that activity.   He avoids fast foods.  He now brings his lunch.    No sx like what he had in 04/2019 when he saw Dayna.   Past Medical History:  Diagnosis Date  . Aortic insufficiency    a. 07/2012 s/p AVR: #25 Magnaease Bioprosthetic Valve (performed @ time of CABG).  . CAD (coronary artery disease)    a. 12/2011 NSTEMI/PCI: Stenting of the OM;  b. 06/2012 NSTEMI/PCI: LCX 100%->BMS;  c. 07/2012 Cath: dissectied LM & LCX with patent LCX/OM stents->CABG x 3: LIMA->LAD, VG->OM1->OM2.  . Diabetes mellitus    type II  . Diverticulitis   . Hyperlipidemia   . Hypertension   . Melanoma ([place])   . Moderate mitral regurgitation   . Renal disorder   . Secondary and unspecified malignant neoplasm of lymph nodes, site unspecified     Past Surgical History:  Procedure Laterality Date  . AORTIC VALVE REPLACEMENT  08/02/2012   Procedure: AORTIC VALVE REPLACEMENT (AVR);  Surgeon: Melrose Nakayama, MD;  Location: [facility];  Service: Open Heart Surgery;  Laterality: N/A;  .  CARDIAC CATHETERIZATION  12-22-11   with OM1 Promus stent 12/22/11  . CORONARY ANGIOGRAM  07/30/2012   Procedure: CORONARY ANGIOGRAM;  Surgeon: Minus Breeding, MD;  Location: [facility] CATH LAB;  Service: Cardiovascular;;  . CORONARY ARTERY BYPASS GRAFT  08/02/2012   Procedure: CORONARY ARTERY BYPASS GRAFTING (CABG);  Surgeon: Melrose Nakayama, MD;  Location: [facility];  Service: Open Heart Surgery;  Laterality: N/A;  . ENDOVEIN HARVEST OF GREATER SAPHENOUS VEIN  08/02/2012   Procedure: ENDOVEIN HARVEST OF GREATER SAPHENOUS VEIN;  Surgeon: Melrose Nakayama, MD;  Location: [facility];  Service: Open Heart Surgery;  Laterality: Left;  upper and lower leg  . EYE SURGERY    . INTRAOPERATIVE TRANSESOPHAGEAL  ECHOCARDIOGRAM  08/02/2012   Procedure: INTRAOPERATIVE TRANSESOPHAGEAL ECHOCARDIOGRAM;  Surgeon: Melrose Nakayama, MD;  Location: [facility];  Service: Open Heart Surgery;  Laterality: N/A;  . KERATOTOMIES     BILATERAL  . knee arthoscopy     LEFT KNEE  . KNEE ARTHROSCOPY  06/28/2012   Procedure: ARTHROSCOPY KNEE;  Surgeon: Vickey Huger, MD;  Location: [facility];  Service: Orthopedics;  Laterality: Left;  . knee surgery     left  . ROTATOR CUFF REPAIR     RIGHT  . SEPTOPLASTY    . TEE WITHOUT CARDIOVERSION  07/28/2012   Procedure: TRANSESOPHAGEAL ECHOCARDIOGRAM (TEE);  Surgeon: Thayer Headings, MD;  Location: [facility];  Service: Cardiovascular;  Laterality: N/A;  . TONSILLECTOMY    . ULNAR TUNNEL RELEASE     RIGHT     Current Outpatient Medications  Medication Sig Dispense Refill  . amoxicillin (AMOXIL) 500 MG capsule Take 4 tablets (2,000mg ) by mouth one hour prior to dental work. 4 capsule 3  . aspirin EC 81 MG tablet Take 1 tablet (81 mg total) by mouth daily. 90 tablet 3  . glucosamine-chondroitin 500-400 MG tablet Take 1 tablet by mouth daily.    Marland Kitchen JARDIANCE 25 MG TABS tablet Take 25 mg by mouth every evening.    . meloxicam (MOBIC) 15 MG tablet Take 15 mg by mouth as needed.    . metFORMIN (GLUCOPHAGE-XR) 500 MG 24 hr tablet Take 1,000 mg by mouth daily with breakfast.     . pioglitazone (ACTOS) 30 MG tablet Take 30 mg by mouth daily.    . pravastatin (PRAVACHOL) 20 MG tablet Take 1 tablet (20 mg total) by mouth every evening. 90 tablet 3  . RYBELSUS 7 MG TABS Take 7 mg by mouth daily.    . Turmeric (QC TUMERIC COMPLEX PO) Take 500 mg by mouth daily.     No current facility-administered medications for this visit.    Allergies:   Contrast media [iodinated diagnostic agents], Other, and Gadolinium derivatives    Social History:  The patient  reports that he has never smoked. He has never used smokeless tobacco. He reports that he does not drink alcohol or use drugs.   Family  History:  The patient's family history includes Coronary artery disease in his mother; Diabetes in his paternal grandmother; Heart attack in his paternal grandfather; Heart disease in his father; Hyperlipidemia in his brother and unknown relative; Lung cancer in his mother; Melanoma in his unknown relative; Migraines in his father.    ROS:  Please see the history of present illness.   Otherwise, review of systems are positive for vertigo- improving.   All other systems are reviewed and negative.    PHYSICAL EXAM: VS:  BP 120/78   Pulse 78  Ht 5\' 9"  (1.753 m)   Wt 206 lb 9.6 oz (93.7 kg)   SpO2 96%   BMI 30.51 kg/m  , BMI Body mass index is 30.51 kg/m. GEN: Well nourished, well developed, in no acute distress  HEENT: normal  Neck: no JVD, carotid bruits, or masses Cardiac: RRR; early systolic murmur, no rubs, or gallops,no edema  Respiratory:  clear to auscultation bilaterally, normal work of breathing GI: soft, nontender, nondistended, + BS MS: no deformity or atrophy ; 2+ PT pulses bilaterally Skin: warm and dry, no rash Neuro:  Strength and sensation are intact Psych: euthymic mood, full affect   EKG:   The ekg ordered 10/20 demonstrates NSR, no ST changes   Recent Labs: 05/18/2019: BUN 12; Creatinine, Ser 0.95; Hemoglobin 14.2; Magnesium 1.9; Potassium 4.6; Sodium 138   Lipid Panel    Component Value Date/Time   CHOL 157 07/31/2012 0319   CHOL 105 07/01/2012 0019   TRIG 39 07/31/2012 0319   TRIG 47 07/01/2012 0019   HDL 54 07/31/2012 0319   HDL 35 (L) 07/01/2012 0019   CHOLHDL 2.9 07/31/2012 0319   VLDL 8 07/31/2012 0319   VLDL 9 07/01/2012 0019   LDLCALC 95 07/31/2012 0319   LDLCALC 61 07/01/2012 0019     Other studies Reviewed: Additional studies/ records that were reviewed today with results demonstrating:  LDL 92 in 11/2018.   ASSESSMENT AND PLAN:  1. S/p AVR: SBE prophylaxis for dental procedures.  We discussed down the road, when he may need another  valve replacement.  Based on the 2019 echo, his valve is functioning well. 2. CAD/Old MI: Some atypical sx several months ago.  No recent sx. he remains physically active without any symptoms. 3. DM: Whole food plant based diet. A1C 9.7.  Managed with PMD.   4. AI: 2019 echo reviewed.  Normal LV function.  Normal functioning valve.  Moderate MR. No CHF sx.  5. Vertigo: Asked him to look up BPV.  May be more allergy related since symptoms are improving. 6. Rash: Improving since his glipizide was stopped.  Will check with Pharm.D. if there are any other medicines that might exacerbate the rash on his arms and ankles that he notes when he is in the sun.   Current medicines are reviewed at length with the patient today.  The patient concerns regarding his medicines were addressed.  The following changes have been made:  No change  Labs/ tests ordered today include:  No orders of the defined types were placed in this encounter.   Recommend 150 minutes/week of aerobic exercise Low fat, low carb, high fiber diet recommended  Disposition:   FU in 1 year   Signed, Larae Grooms, MD  11/14/2019 9:44 AM    [facility] [place], [place], [place]  [address] Phone: [phone]; Fax: [phone]

## 2019-11-14 ENCOUNTER — Encounter: Payer: Self-pay | Admitting: Interventional Cardiology

## 2019-11-14 ENCOUNTER — Other Ambulatory Visit: Payer: Self-pay

## 2019-11-14 ENCOUNTER — Ambulatory Visit (INDEPENDENT_AMBULATORY_CARE_PROVIDER_SITE_OTHER): Payer: BC Managed Care – PPO | Admitting: Interventional Cardiology

## 2019-11-14 VITALS — BP 120/78 | HR 78 | Ht 69.0 in | Wt 206.6 lb

## 2019-11-14 DIAGNOSIS — E1159 Type 2 diabetes mellitus with other circulatory complications: Secondary | ICD-10-CM | POA: Diagnosis not present

## 2019-11-14 DIAGNOSIS — I351 Nonrheumatic aortic (valve) insufficiency: Secondary | ICD-10-CM

## 2019-11-14 DIAGNOSIS — I252 Old myocardial infarction: Secondary | ICD-10-CM | POA: Diagnosis not present

## 2019-11-14 DIAGNOSIS — I25118 Atherosclerotic heart disease of native coronary artery with other forms of angina pectoris: Secondary | ICD-10-CM | POA: Diagnosis not present

## 2019-11-14 NOTE — Progress Notes (Signed)
Called and instructed for patient to cut back on the meloxicam and see if his rash improves. Patient states that he rarely takes the meloxicam.

## 2019-11-14 NOTE — Progress Notes (Signed)
Patient can try to cut back on meloxicam to see if rash improves.   Roy [name]

## 2019-11-14 NOTE — Patient Instructions (Signed)
Medication Instructions:  Your physician recommends that you continue on your current medications as directed. Please refer to the Current Medication list given to you today.  *If you need a refill on your cardiac medications before your next appointment, please call your pharmacy*   Lab Work: None ordered  If you have labs (blood work) drawn today and your tests are completely normal, you will receive your results only by: Marland Kitchen MyChart Message (if you have MyChart) OR . A paper copy in the mail If you have any lab test that is abnormal or we need to change your treatment, we will call you to review the results.   Testing/Procedures: None ordered   Follow-Up: At [facility], you and your health needs are our priority.  As part of our continuing mission to provide you with exceptional heart care, we have created designated Provider Care Teams.  These Care Teams include your primary Cardiologist (physician) and Advanced Practice Providers (APPs -  Physician Assistants and Nurse Practitioners) who all work together to provide you with the care you need, when you need it.  We recommend signing up for the patient portal called "MyChart".  Sign up information is provided on this After Visit Summary.  MyChart is used to connect with patients for Virtual Visits (Telemedicine).  Patients are able to view lab/test results, encounter notes, upcoming appointments, etc.  Non-urgent messages can be sent to your provider as well.   To learn more about what you can do with MyChart, go to [email or web address].    Your next appointment:   12 month(s)  The format for your next appointment:   In Person  Provider:   You may see Larae Grooms, MD or one of the following Advanced Practice Providers on your designated Care Team:    Melina Copa, PA-C  Ermalinda Barrios, PA-C    Other Instructions  Benign Positional Vertigo Vertigo is the feeling that you or your surroundings are moving when they  are not. Benign positional vertigo is the most common form of vertigo. This is usually a harmless condition (benign). This condition is positional. This means that symptoms are triggered by certain movements and positions. This condition can be dangerous if it occurs while you are doing something that could cause harm to you or others. This includes activities such as driving or operating machinery. What are the causes? In many cases, the cause of this condition is not known. It may be caused by a disturbance in an area of the inner ear that helps your brain to sense movement and balance. This disturbance can be caused by:  Viral infection (labyrinthitis).  Head injury.  Repetitive motion, such as jumping, dancing, or running. What increases the risk? You are more likely to develop this condition if:  You are a woman.  You are 79 years of age or older. What are the signs or symptoms? Symptoms of this condition usually happen when you move your head or your eyes in different directions. Symptoms may start suddenly, and usually last for less than a minute. They include:  Loss of balance and falling.  Feeling like you are spinning or moving.  Feeling like your surroundings are spinning or moving.  Nausea and vomiting.  Blurred vision.  Dizziness.  Involuntary eye movement (nystagmus). Symptoms can be mild and cause only minor problems, or they can be severe and interfere with daily life. Episodes of benign positional vertigo may return (recur) over time. Symptoms may improve over time. How is this  diagnosed? This condition may be diagnosed based on:  Your medical history.  Physical exam of the head, neck, and ears.  Tests, such as: ? MRI. ? CT scan. ? Eye movement tests. Your health care provider may ask you to change positions quickly while he or she watches you for symptoms of benign positional vertigo, such as nystagmus. Eye movement may be tested with a variety of exams  that are designed to evaluate or stimulate vertigo. ? An electroencephalogram (EEG). This records electrical activity in your brain. ? Hearing tests. You may be referred to a health care provider who specializes in ear, nose, and throat (ENT) problems (otolaryngologist) or a provider who specializes in disorders of the nervous system (neurologist). How is this treated?  This condition may be treated in a session in which your health care provider moves your head in specific positions to adjust your inner ear back to normal. Treatment for this condition may take several sessions. Surgery may be needed in severe cases, but this is rare. In some cases, benign positional vertigo may resolve on its own in 2-4 weeks. Follow these instructions at home: Safety  Move slowly. Avoid sudden body or head movements or certain positions, as told by your health care provider.  Avoid driving until your health care provider says it is safe for you to do so.  Avoid operating heavy machinery until your health care provider says it is safe for you to do so.  Avoid doing any tasks that would be dangerous to you or others if vertigo occurs.  If you have trouble walking or keeping your balance, try using a cane for stability. If you feel dizzy or unstable, sit down right away.  Return to your normal activities as told by your health care provider. Ask your health care provider what activities are safe for you. General instructions  Take over-the-counter and prescription medicines only as told by your health care provider.  Drink enough fluid to keep your urine pale yellow.  Keep all follow-up visits as told by your health care provider. This is important. Contact a health care provider if:  You have a fever.  Your condition gets worse or you develop new symptoms.  Your family or friends notice any behavioral changes.  You have nausea or vomiting that gets worse.  You have numbness or a "pins and  needles" sensation. Get help right away if you:  Have difficulty speaking or moving.  Are always dizzy.  Faint.  Develop severe headaches.  Have weakness in your legs or arms.  Have changes in your hearing or vision.  Develop a stiff neck.  Develop sensitivity to light. Summary  Vertigo is the feeling that you or your surroundings are moving when they are not. Benign positional vertigo is the most common form of vertigo.  The cause of this condition is not known. It may be caused by a disturbance in an area of the inner ear that helps your brain to sense movement and balance.  Symptoms include loss of balance and falling, feeling that you or your surroundings are moving, nausea and vomiting, and blurred vision.  This condition can be diagnosed based on symptoms, physical exam, and other tests, such as MRI, CT scan, eye movement tests, and hearing tests.  Follow safety instructions as told by your health care provider. You will also be told when to contact your health care provider in case of problems. This information is not intended to replace advice given to you  by your health care provider. Make sure you discuss any questions you have with your health care provider. Document Revised: 12/16/2017 Document Reviewed: 12/16/2017 Elsevier Patient Education  [place].

## 2019-11-17 ENCOUNTER — Ambulatory Visit: Payer: BC Managed Care – PPO | Admitting: Interventional Cardiology

## 2020-03-04 ENCOUNTER — Other Ambulatory Visit: Payer: Self-pay | Admitting: Interventional Cardiology

## 2020-03-09 ENCOUNTER — Other Ambulatory Visit: Payer: Self-pay

## 2020-03-09 ENCOUNTER — Telehealth: Payer: Self-pay | Admitting: Interventional Cardiology

## 2020-03-09 MED ORDER — PRAVASTATIN SODIUM 20 MG PO TABS: ORAL_TABLET | ORAL | 3 refills | Status: DC

## 2020-03-09 NOTE — Telephone Encounter (Signed)
Spoke with patient. Patient has moved. No longer uses pharmacy in [place], [place]. Please send prescriptions to [facility] in [place], [place]. 03/09/20 vlm

## 2020-04-13 ENCOUNTER — Other Ambulatory Visit: Payer: Self-pay

## 2020-04-13 ENCOUNTER — Encounter (HOSPITAL_BASED_OUTPATIENT_CLINIC_OR_DEPARTMENT_OTHER): Payer: Self-pay | Admitting: Surgery

## 2020-04-13 NOTE — Progress Notes (Signed)
Spoke w/ via phone for pre-op interview--- PT Lab needs dos---- Istat             Lab results------ current ekg in epic/ chart COVID test ------ 04-14-2020 @ 1010 Arrive at -------0700 NPO after MN NO Solid Food.  Clear liquids from MN until--- 0600 Medications to take morning of surgery ----- NONE Diabetic medication -----do not take any of your diabetic medication morning of surgery Patient Special Instructions ----- n/a Pre-Op special Istructions ----- n/a Patient verbalized understanding of instructions that were given at this phone interview. Patient denies shortness of breath, chest pain, fever, cough at this phone interview.   Anesthesia Review:  HTN,  CAD post two NSTEMI June w/ DES x1 and dec. 2013 w/ BMS x1,  And s/p CABG x3 with AVR 01/ 2014,  DM2.  Pt denies cardiac s&s, sob , or peripheral swelling.  PCP:  Dr Harlan Stains Cardiologist :  Dr Irish Lack (lov 11-14-2019 epic) Chest x-ray : 09-07-2014 epic EKG :  05-18-2019 epic Echo : 03-05-2018 Stress test: 09-12-2014 epic Cardiac Cath :  07-30-2012 epic Activity level:  No sob with any activity Sleep Study/ CPAP : NO Fasting Blood Sugar :   120--150 / Checks Blood Sugar -- times a day:  3 times weekly Blood Thinner/ Instructions Maryjane Hurter Dose:  NO ASA / Instructions/ Last Dose : ASA 81mg /  Per pt was not given any instructions to stop prior to surgery from office.

## 2020-04-14 ENCOUNTER — Other Ambulatory Visit (HOSPITAL_COMMUNITY)
Admission: RE | Admit: 2020-04-14 | Discharge: 2020-04-14 | Disposition: A | Discharge status: Home / Self Care | Payer: BC Managed Care – PPO | Source: Ambulatory Visit | Attending: Surgery | Admitting: Surgery

## 2020-04-14 DIAGNOSIS — Z01812 Encounter for preprocedural laboratory examination: Secondary | ICD-10-CM | POA: Diagnosis not present

## 2020-04-14 DIAGNOSIS — Z20822 Contact with and (suspected) exposure to covid-19: Secondary | ICD-10-CM | POA: Insufficient documentation

## 2020-04-14 LAB — SARS CORONAVIRUS 2 (TAT 6-24 HRS): SARS Coronavirus 2: NEGATIVE

## 2020-04-18 ENCOUNTER — Ambulatory Visit (HOSPITAL_BASED_OUTPATIENT_CLINIC_OR_DEPARTMENT_OTHER): Admission: RE | Admit: 2020-04-18 | Payer: BC Managed Care – PPO | Source: Home / Self Care | Admitting: Surgery

## 2020-04-18 HISTORY — DX: Diverticulosis of large intestine without perforation or abscess without bleeding: K57.30

## 2020-04-18 HISTORY — DX: Mixed hyperlipidemia: E78.2

## 2020-04-18 HISTORY — DX: Personal history of other malignant neoplasm of skin: Z85.828

## 2020-04-18 HISTORY — DX: Benign lipomatous neoplasm of skin and subcutaneous tissue of trunk: D17.1

## 2020-04-18 HISTORY — DX: Presence of spectacles and contact lenses: Z97.3

## 2020-04-18 HISTORY — DX: Type 2 diabetes mellitus without complications: E11.9

## 2020-04-18 HISTORY — DX: Unspecified osteoarthritis, unspecified site: M19.90

## 2020-04-18 HISTORY — DX: Personal history of urinary calculi: Z87.442

## 2020-04-18 HISTORY — DX: Personal history of other diseases of the digestive system: Z87.19

## 2020-04-18 SURGERY — EXCISION LIPOMA
Anesthesia: Monitor Anesthesia Care

## 2020-07-03 DIAGNOSIS — M2351 Chronic instability of knee, right knee: Secondary | ICD-10-CM | POA: Insufficient documentation

## 2020-07-05 ENCOUNTER — Other Ambulatory Visit: Payer: Self-pay | Admitting: Orthopedic Surgery

## 2020-07-05 DIAGNOSIS — M25569 Pain in unspecified knee: Secondary | ICD-10-CM

## 2020-07-05 DIAGNOSIS — M25561 Pain in right knee: Secondary | ICD-10-CM

## 2020-08-05 ENCOUNTER — Other Ambulatory Visit: Payer: BC Managed Care – PPO

## 2020-11-01 ENCOUNTER — Telehealth: Payer: Self-pay | Admitting: *Deleted

## 2020-11-01 NOTE — Telephone Encounter (Signed)
Left message to call office

## 2020-11-01 NOTE — Telephone Encounter (Signed)
-----   Message from Jettie Booze, MD sent at 11/01/2020 12:33 PM EDT ----- Would stop pravastatin and start rosuvastatin 20 mg daily.  Lipids and liver tests in 3 months. I don't think they are retesting lipids.  Dr. Dema Severin asked me to address his lipids.

## 2020-11-06 NOTE — Telephone Encounter (Addendum)
Spoke with pt about lab results. He's tolerating pravastatin 20mg  daily but is previously intolerant to atorvastatin 20-40mg  daily and rosuvastatin 5-20mg  daily. Previously discussed PCSK9i therapy with pt a while back in lipid clinic but he was opposed to injectable therapy and still is. He states he has been making a lot of improvements in his diet/exercise and sees his PCP on either May 3 or 5. He plans to ask them to recheck his cholesterol since this panel was drawn back in February. If his LDL remains above goal (advised him it likely will as his LDL was 171 on pravastatin 20mg  and his goal is < 70 given his history of ASCVD), he is willing to try Nexlizet. Prior authorization submitted but not needed, med available on formulary. Will discuss with pt once updated PCP labs result in the next few weeks.

## 2020-11-23 ENCOUNTER — Telehealth: Payer: Self-pay | Admitting: Pharmacist

## 2020-11-23 DIAGNOSIS — I25118 Atherosclerotic heart disease of native coronary artery with other forms of angina pectoris: Secondary | ICD-10-CM

## 2020-11-23 MED ORDER — NEXLIZET 180-10 MG PO TABS: 1.0000 | ORAL_TABLET | Freq: Every day | ORAL | 3 refills | Status: DC

## 2020-11-23 NOTE — Telephone Encounter (Addendum)
Lipid panel 09/14/20: TC 233, TG 71, HDL 50, LDL 171 - on pravastatin 20mg  daily. Pt did not want to make any med changes when I spoke with him last month. Reported improvements in lifestyle and wanted to recheck labs first. PCP has since rechecked labs on 11/20/20: TC 220, TG 55, HDL 49, LDL 161.  LDL has dropped 10 points and remains far above goal < 70 given history of ASCVD (would even target more aggressive goal < 55 given premature ASCVD + DM). He is intolerant to atorvastatin 20-40mg  daily and rosuvastatin 5-20mg  daily. Opposed to injectable cholesterol medications (have discussed on multiple occasions with pt). Called pt to discuss adding Nexlizet which he is now willing to do. He will continue on pravastatin as well. Rx sent to pharmacy along with $10 copay card, will recheck labs in 2 months, same day as annual follow up with Dr Irish Lack.

## 2020-11-27 ENCOUNTER — Other Ambulatory Visit: Payer: Self-pay | Admitting: Family Medicine

## 2020-11-27 DIAGNOSIS — R590 Localized enlarged lymph nodes: Secondary | ICD-10-CM

## 2020-12-03 ENCOUNTER — Telehealth: Payer: Self-pay | Admitting: Interventional Cardiology

## 2020-12-03 NOTE — Telephone Encounter (Signed)
Patient notified. He will let us know if symptoms continue after decreasing dose of Pravastatin

## 2020-12-03 NOTE — Telephone Encounter (Signed)
I agree that he should reduce to 20mg  daily and continue nexlizet.Marland Kitchen

## 2020-12-03 NOTE — Telephone Encounter (Signed)
On telephone note from 4/14 Dr Irish Lack recommended switching from pravastatin to rosuvastatin.  Did patient do this or did he increase his pravastatin?

## 2020-12-03 NOTE — Telephone Encounter (Signed)
Pt c/o medication issue:  1. Name of Medication: pravastatin (PRAVACHOL) 20 MG tablet  2. How are you currently taking this medication (dosage and times per day)?TAKE 1 TABLET BY MOUTH EVERY DAY IN THE EVENINGPatient taking differently: Take 20 mg by mouth at bedtime. TAKE 1 TABLET BY MOUTH EVERY DAY IN THE EVENING   3. Are you having a reaction (difficulty breathing--STAT)? No difficulty breathing  4. What is your medication issue? Sore muscles, lightheadedness  Pt stopped taking this medicine 2 days ago. Pt states that Dr. Irish Lack increased the dosage a few weeks ago to 40mg 

## 2020-12-03 NOTE — Telephone Encounter (Signed)
I spoke with patient. He reports Pravastatin was increased in early May to 40 mg daily.  He states this was in a phone call.  Chart reviewed and Nexlizet was started in 5/6 phone note but patient was to continue same dose pravastatin.  He started Nexlizet a couple of days ago.  Patient states since early May he has been having increase in joint pain.  Is in neck, shoulders and hands. I told patient he should go back to Pravastatin 20 mg daily and that I would forward to pharmacist for any additional recommendations

## 2020-12-18 ENCOUNTER — Telehealth: Payer: Self-pay | Admitting: Interventional Cardiology

## 2020-12-18 NOTE — Telephone Encounter (Signed)
I spoke with patient. He is taking Pravastatin 20 mg daily and Nexlizet.  Nexlizet was started in early May.  He reports for the last couple of weeks he has not been feeling right.  He is nauseated, has headaches and muscle/bone pain.  No other new medications.  He thinks it may be related to Nexlizet

## 2020-12-18 NOTE — Telephone Encounter (Signed)
Recommend he hold Nexlizet for the next week and see if symptoms subside

## 2020-12-18 NOTE — Telephone Encounter (Signed)
Pt is calling with questions about the new medication

## 2020-12-18 NOTE — Telephone Encounter (Signed)
Patient notified.  He will call back with an update after holding Nexlizet for a week

## 2021-01-24 ENCOUNTER — Ambulatory Visit (INDEPENDENT_AMBULATORY_CARE_PROVIDER_SITE_OTHER): Payer: BC Managed Care – PPO | Admitting: Interventional Cardiology

## 2021-01-24 ENCOUNTER — Other Ambulatory Visit: Payer: Self-pay

## 2021-01-24 ENCOUNTER — Other Ambulatory Visit: Payer: BC Managed Care – PPO

## 2021-01-24 ENCOUNTER — Encounter: Payer: Self-pay | Admitting: Interventional Cardiology

## 2021-01-24 VITALS — BP 126/66 | HR 94 | Ht 69.0 in | Wt 192.0 lb

## 2021-01-24 DIAGNOSIS — E1159 Type 2 diabetes mellitus with other circulatory complications: Secondary | ICD-10-CM

## 2021-01-24 DIAGNOSIS — I25118 Atherosclerotic heart disease of native coronary artery with other forms of angina pectoris: Secondary | ICD-10-CM

## 2021-01-24 DIAGNOSIS — I252 Old myocardial infarction: Secondary | ICD-10-CM

## 2021-01-24 DIAGNOSIS — I351 Nonrheumatic aortic (valve) insufficiency: Secondary | ICD-10-CM | POA: Diagnosis not present

## 2021-01-24 DIAGNOSIS — E782 Mixed hyperlipidemia: Secondary | ICD-10-CM

## 2021-01-24 DIAGNOSIS — I10 Essential (primary) hypertension: Secondary | ICD-10-CM

## 2021-01-24 NOTE — Patient Instructions (Signed)
Medication Instructions:  Your physician recommends that you continue on your current medications as directed. Please refer to the Current Medication list given to you today.   *If you need a refill on your cardiac medications before your next appointment, please call your pharmacy*   Lab Work: To be done at primary care in a month  If you have labs (blood work) drawn today and your tests are completely normal, you will receive your results only by: [place] (if you have MyChart) OR A paper copy in the mail If you have any lab test that is abnormal or we need to change your treatment, we will call you to review the results.   Testing/Procedures: none   Follow-Up: At [facility], you and your health needs are our priority.  As part of our continuing mission to provide you with exceptional heart care, we have created designated Provider Care Teams.  These Care Teams include your primary Cardiologist (physician) and Advanced Practice Providers (APPs -  Physician Assistants and Nurse Practitioners) who all work together to provide you with the care you need, when you need it.  We recommend signing up for the patient portal called "MyChart".  Sign up information is provided on this After Visit Summary.  MyChart is used to connect with patients for Virtual Visits (Telemedicine).  Patients are able to view lab/test results, encounter notes, upcoming appointments, etc.  Non-urgent messages can be sent to your provider as well.   To learn more about what you can do with MyChart, go to [email or web address].    Your next appointment:   12 month(s)  The format for your next appointment:   In Person  Provider:   You may see Larae Grooms, MD or one of the following Advanced Practice Providers on your designated Care Team:   Melina Copa, PA-C Ermalinda Barrios, PA-C   Other Instructions

## 2021-01-24 NOTE — Progress Notes (Signed)
Cardiology Office Note   Date:  01/24/2021   ID:  Roy [name], DOB 04/05/1961, MRN [id number]  PCP:  Harlan Stains, MD    No chief complaint on file.  CAD  Wt Readings from Last 3 Encounters:  01/24/21 192 lb (87.1 kg)  11/14/19 206 lb 9.6 oz (93.7 kg)  05/18/19 200 lb 12.8 oz (91.1 kg)       History of Present Illness: Roy [name] is a 60 y.o. male  who has a family h/o CAD. He had an MI in 6/13 with a stent placed, with severe jaw pain and chest pain. In 12/13, he had knee surgery and 2 days later, he had an MI. He had a stent placed at that time. 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 He feels that the valve was damaged during the cath and wanted a new cardiologist, so he saw me for the first time in 2014. He feels better now and had a tissue valve due to not wanting to take coumadin.   He had a question of TIA in June 2018.  It turned out to be a migraine per his report.  He had an echo at that time showing:.   Left ventricle: The cavity size was normal. There was moderate   concentric hypertrophy. Systolic function was vigorous. The   estimated ejection fraction was in the range of 65% to 70%. Wall   motion was normal; there were no regional wall motion   abnormalities. Doppler parameters are consistent with abnormal   left ventricular relaxation (grade 1 diastolic dysfunction). - Aortic valve: Poorly visualized. Trileaflet; mildly thickened,   mildly calcified leaflets. There was mild stenosis. Peak velocity   (S): 253 cm/s. Mean gradient (S): 15 mm Hg. - Mitral valve: Moderately calcified annulus. - Left atrium: The atrium was mildly dilated.   Impressions:   - No cardiac source of emboli was indentified.   Lost weight through decreased portion sizes.   Had some atypical chest/arm pains in late 2020.  Medically managed.   In 2021, it was noted: "he had vertigo.     He does stay hydrated.  He takes jardiance  and Rybelsus make him urinate a lot.     Also has a rash on arms and legs, when exposed to sun.  Improved after stopping glipizide.     Treadmill, weights at home and job is strenuous.  No cardiac sx with that activity.   He avoids fast foods.  He now brings his lunch. "   COntinue to have occasional arm pains during the work day.  Not all of the time. He does not have to stop working.   Denies : Chest pain.  Leg edema. Nitroglycerin use. Orthopnea. Palpitations. Paroxysmal nocturnal dyspnea. Shortness of breath. Syncope.     Vertigo sx as well.   No sx like his prior angina.   Still working on the truck, and out in the heat.      Past Medical History:  Diagnosis Date   Aortic insufficiency    a. 07/2012 s/p AVR: #25 Magnaease Bioprosthetic Valve (performed @ time of CABG).   Arthritis    CAD (coronary artery disease) cardiologist--- dr Beau Fanny   a. 12/2011 NSTEMI/ PCI with DES to [place];  b. 06/2012 NSTEMI/ PCI with BMS to mid LCX 100%->;  c. 07/2012 Cath: dissectied LM & LCX with patent LCX/OM stents->CABG x 3: LIMA->LAD, VG->OM1->OM2.;  nuclear study  09-12-2014 no ischemia and normal LVF   Diastolic CHF ([place]) 37/1062   Diverticulosis of colon    History of diverticulitis of colon    History of kidney stones    History of non-ST elevation myocardial infarction (NSTEMI) 2013   06/ 2013 s/p DES  and 12/ 2013  s/p BMS    History of skin cancer    04-13-2020  per pt has had multiiple areas burned off by dermatologist in office,  none have been biopsy'd   Lipoma of back    upper   Mixed hyperlipidemia    Moderate mitral regurgitation    with no stenosis per echo 03-05-2018   S/P aortic valve replacement 08/02/2012   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   LIMA to LAD,  SVG to OM1 ,  SVG to OM2   Type 2 diabetes mellitus ([place])    followed by pcp  (04-13-2020  per pt checks blood sugar 3 times weekly,  fasting sugar-- 120--150)   Wears contact lenses     Past Surgical History:  Procedure  Laterality Date   AORTIC VALVE REPLACEMENT  08/02/2012   Procedure: AORTIC VALVE REPLACEMENT (AVR);  Surgeon: Melrose Nakayama, MD;  Location: [place];  Service: Open Heart Surgery;  Laterality: N/A;   CARDIAC CATHETERIZATION  07/30/2012   dissection LM and CFx,  patent previous stents   CORONARY ANGIOGRAM  07/30/2012   Procedure: CORONARY ANGIOGRAM;  Surgeon: Minus Breeding, MD;  Location: [facility] CATH LAB;  Service: Cardiovascular;;   CORONARY ANGIOPLASTY WITH STENT PLACEMENT  12-22-2011  @[facility]    DES to OM1 (NSTEMI)   CORONARY ANGIOPLASTY WITH STENT PLACEMENT  07-01-2012  @[facility]    BMS to midCFx (NSTEMI)   CORONARY ARTERY BYPASS GRAFT  08/02/2012   Procedure: CORONARY ARTERY BYPASS GRAFTING (CABG);  Surgeon: Melrose Nakayama, MD;  Location: [place];  Service: Open Heart Surgery;  Laterality: N/A;   ENDOVEIN HARVEST OF GREATER SAPHENOUS VEIN  08/02/2012   Procedure: ENDOVEIN HARVEST OF GREATER SAPHENOUS VEIN;  Surgeon: Melrose Nakayama, MD;  Location: [place];  Service: Open Heart Surgery;  Laterality: Left;  upper and lower leg   EXTRACORPOREAL SHOCK WAVE LITHOTRIPSY  09/2010   INTRAOPERATIVE TRANSESOPHAGEAL ECHOCARDIOGRAM  08/02/2012   Procedure: INTRAOPERATIVE TRANSESOPHAGEAL ECHOCARDIOGRAM;  Surgeon: Melrose Nakayama, MD;  Location: [place];  Service: Open Heart Surgery;  Laterality: N/A;   KERATOTOMIES Bilateral 1985   both eyes   KNEE ARTHROSCOPY  06/28/2012   Procedure: ARTHROSCOPY KNEE;  Surgeon: Vickey Huger, MD;  Location: [place];  Service: Orthopedics;  Laterality: Left;   KNEE ARTHROSCOPY Left x1  after 2013 (unsure date)   ROTATOR CUFF REPAIR Bilateral 2007  and 11/ 2017   SEPTOPLASTY  1990s   SHOULDER ARTHROSCOPY Left 12/2018   w/ decompression, debridement and manipulation   TEE WITHOUT CARDIOVERSION  07/28/2012   Procedure: TRANSESOPHAGEAL ECHOCARDIOGRAM (TEE);  Surgeon: Thayer Headings, MD;  Location: [facility] ENDOSCOPY;  Service: Cardiovascular;  Laterality: N/A;   TONSILLECTOMY  age  61   ULNAR TUNNEL RELEASE Right 1990s     Current Outpatient Medications  Medication Sig Dispense Refill   amoxicillin (AMOXIL) 500 MG capsule Take 4 tablets (2,000mg ) by mouth one hour prior to dental work. 4 capsule 3   aspirin EC 81 MG tablet Take 1 tablet (81 mg total) by mouth daily. 90 tablet 3   Bempedoic Acid-Ezetimibe (NEXLIZET) 180-10 MG TABS Take 1 tablet by mouth daily. 90 tablet 3   glucosamine-chondroitin 500-400 MG  tablet Take 1 tablet by mouth 3 (three) times daily.     JARDIANCE 25 MG TABS tablet Take 25 mg by mouth daily.      meloxicam (MOBIC) 15 MG tablet Take 15 mg by mouth as needed.     metFORMIN (GLUCOPHAGE-XR) 500 MG 24 hr tablet Take 1,000 mg by mouth daily with breakfast.      pioglitazone (ACTOS) 45 MG tablet Take 45 mg by mouth daily.     pravastatin (PRAVACHOL) 20 MG tablet TAKE 1 TABLET BY MOUTH EVERY DAY IN THE EVENING 90 tablet 3   RYBELSUS 7 MG TABS Take 7 mg by mouth daily.      sildenafil (VIAGRA) 50 MG tablet Take 50 mg by mouth daily as needed for erectile dysfunction.     Turmeric (QC TUMERIC COMPLEX PO) Take 500 mg by mouth daily.     No current facility-administered medications for this visit.    Allergies:   Contrast media [iodinated diagnostic agents], Topiramate, and Gadolinium derivatives    Social History:  The patient  reports that he has never smoked. He has never used smokeless tobacco. He reports that he does not drink alcohol and does not use drugs.   Family History:  The patient's family history includes Coronary artery disease in his mother; Diabetes in his paternal grandmother; Heart attack in his paternal grandfather; Heart disease in his father; Hyperlipidemia in his brother and another family member; Lung cancer in his mother; Melanoma in an other family member; Migraines in his father.    ROS:  Please see the history of present illness.   Otherwise, review of systems are positive for vertigo, intentional weight loss.   All other  systems are reviewed and negative.    PHYSICAL EXAM: VS:  BP 126/66   Pulse 94   Ht 5\' 9"  (1.753 m)   Wt 192 lb (87.1 kg)   SpO2 95%   BMI 28.35 kg/m  , BMI Body mass index is 28.35 kg/m. GEN: Well nourished, well developed, in no acute distress HEENT: normal Neck: no JVD, carotid bruits, or masses Cardiac: RRR; no murmurs, rubs, or gallops,no edema  Respiratory:  clear to auscultation bilaterally, normal work of breathing GI: soft, nontender, nondistended, + BS MS: no deformity or atrophy Skin: warm and dry, no rash Neuro:  Strength and sensation are intact Psych: euthymic mood, full affect   EKG:   The ekg ordered today demonstrates NSR, inferior Q waves, no ST changes   Recent Labs: No results found for requested labs within last 8760 hours.   Lipid Panel    Component Value Date/Time   CHOL 157 07/31/2012 0319   CHOL 105 07/01/2012 0019   TRIG 39 07/31/2012 0319   TRIG 47 07/01/2012 0019   HDL 54 07/31/2012 0319   HDL 35 (L) 07/01/2012 0019   CHOLHDL 2.9 07/31/2012 0319   VLDL 8 07/31/2012 0319   VLDL 9 07/01/2012 0019   LDLCALC 95 07/31/2012 0319   LDLCALC 61 07/01/2012 0019     Other studies Reviewed: Additional studies/ records that were reviewed today with results demonstrating: labs reviewed.   ASSESSMENT AND PLAN:  CAD: No clear angina.  Continue aggressive secondary prevention.  Arm pain worse in the heat.  We discussed stress test but he feels it is not that bad.  If symptoms get worse, let us know and we can do a stress test and/or an echocardiogram.  He will certainly let us know if he gets any jaw  pain like his prior angina. DM: Whole food, plant-based diet.  High-fiber intake will help lower blood sugars. A1C 8.7 in 5/22.  Having a lot of urination with Jardiance, so he has decreased the frequency of Jardiance to every other day.  Also used some Amoxicillin or perceived UTI. S/p AVR: SBE prophylaxis.   Hyperlipidemia: started on Nexlizet after  LDL 161.   Moderate MR: No sign of volume overload.   Current medicines are reviewed at length with the patient today.  The patient concerns regarding his medicines were addressed.  The following changes have been made:  No change  Labs/ tests ordered today include:  No orders of the defined types were placed in this encounter.   Recommend 150 minutes/week of aerobic exercise Low fat, low carb, high fiber diet recommended  Disposition:   FU in 1 year   Signed, Larae Grooms, MD  01/24/2021 9:39 AM    [facility] [address], [place], [place]  [address] Phone: [phone]; Fax: [phone]

## 2021-02-02 ENCOUNTER — Other Ambulatory Visit: Payer: Self-pay | Admitting: Interventional Cardiology

## 2021-02-05 ENCOUNTER — Telehealth: Payer: Self-pay | Admitting: Pharmacist

## 2021-02-05 NOTE — Telephone Encounter (Signed)
Called pt to see if his PCP had checked lipids (was supposed to have labs checked at appt with Dr Irish Lack a few weeks ago but canceled them with note that his PCP would check). Pt states he doesn't see his PCP until Sept, does not know what date.  He reports adherence to Nexlizet and pravastatin each day. Will call pt if labs haven't been checked by end of Sept.

## 2021-04-10 ENCOUNTER — Other Ambulatory Visit: Payer: Self-pay

## 2021-04-10 DIAGNOSIS — I83892 Varicose veins of left lower extremities with other complications: Secondary | ICD-10-CM

## 2021-04-15 ENCOUNTER — Other Ambulatory Visit: Payer: Self-pay

## 2021-04-15 ENCOUNTER — Ambulatory Visit (HOSPITAL_COMMUNITY)
Admission: RE | Admit: 2021-04-15 | Discharge: 2021-04-15 | Disposition: A | Discharge status: Home / Self Care | Payer: BC Managed Care – PPO | Source: Ambulatory Visit | Attending: Surgery | Admitting: Surgery

## 2021-04-15 DIAGNOSIS — I83892 Varicose veins of left lower extremities with other complications: Secondary | ICD-10-CM | POA: Insufficient documentation

## 2021-04-24 NOTE — Telephone Encounter (Signed)
Update - lipid panel checked at PCP 04/04/21. TC 136, HDL 43, LDL 75, TG 100. Pt on pravastatin 20mg  daily and Nexlizet 180-10mg  daily, LDL previously 161 before addition of Nexlizet with notable improvement. He is opposed to PCSK9i injectable therapy and is intolerant to atorvastatin 20-40mg  daily and rosuvastatin 5-20mg  daily. Will continue current meds.

## 2021-05-01 ENCOUNTER — Ambulatory Visit: Payer: BC Managed Care – PPO | Admitting: Vascular Surgery

## 2021-05-03 ENCOUNTER — Encounter: Payer: BC Managed Care – PPO | Admitting: Vascular Surgery

## 2021-05-15 ENCOUNTER — Encounter: Payer: Self-pay | Admitting: Vascular Surgery

## 2021-05-15 ENCOUNTER — Ambulatory Visit (INDEPENDENT_AMBULATORY_CARE_PROVIDER_SITE_OTHER): Payer: BC Managed Care – PPO | Admitting: Vascular Surgery

## 2021-05-15 ENCOUNTER — Other Ambulatory Visit: Payer: Self-pay

## 2021-05-15 VITALS — BP 123/80 | HR 76 | Temp 98.1°F | Resp 20 | Ht 69.0 in | Wt 188.0 lb

## 2021-05-15 DIAGNOSIS — I83892 Varicose veins of left lower extremities with other complications: Secondary | ICD-10-CM
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
## 2021-05-21 ENCOUNTER — Other Ambulatory Visit: Payer: Self-pay | Admitting: Interventional Cardiology

## 2021-05-31 ENCOUNTER — Other Ambulatory Visit: Payer: Self-pay

## 2021-05-31 ENCOUNTER — Ambulatory Visit (INDEPENDENT_AMBULATORY_CARE_PROVIDER_SITE_OTHER): Payer: BC Managed Care – PPO

## 2021-05-31 DIAGNOSIS — R0989 Other specified symptoms and signs involving the circulatory and respiratory systems: Secondary | ICD-10-CM | POA: Diagnosis not present

## 2021-05-31 DIAGNOSIS — R52 Pain, unspecified: Secondary | ICD-10-CM | POA: Diagnosis not present

## 2021-05-31 NOTE — Progress Notes (Signed)
Treated pt's L spider vein cluster around inner aspect of knee with Asclera 1% administered with a 27g butterfly.  Patient received a total of 1.5 mL. Pt developed a cluster of spider veins with pain in this area right after having his L GSV harvested for CABG in 2013. He tolerated sclerotherapy well today. Easy access. Area looked visibly improved with sclerotherapy. He is going on a last minute planned trip to visit his son in [place] in ten days. Pt has been advised he is to avoid flying for 2 weeks after sclero but was very persistent in wanting to proceed today with treatment. We discussed s/s of a DVT and that he is to wear his stockings while flying and during the trip. Pt given post procedure care instructions on handout and verbally; he verbalized understanding of all the above. Will follow PRN.   Photos: Yes.    Compression stockings applied: Yes.

## 2021-06-26 ENCOUNTER — Telehealth: Payer: Self-pay

## 2021-06-26 NOTE — Telephone Encounter (Signed)
Called pt to f/u on sclerotherapy. Pt feels color of vein treated has improved and pain is a little less. Will continue to follow prn. No questions/concerns at this time.

## 2022-02-10 ENCOUNTER — Other Ambulatory Visit: Payer: Self-pay | Admitting: Interventional Cardiology

## 2022-02-18 ENCOUNTER — Other Ambulatory Visit: Payer: Self-pay

## 2022-02-18 ENCOUNTER — Emergency Department (HOSPITAL_BASED_OUTPATIENT_CLINIC_OR_DEPARTMENT_OTHER)
Admission: EM | Admit: 2022-02-18 | Discharge: 2022-02-19 | Disposition: A | Discharge status: Home / Self Care | Payer: BC Managed Care – PPO | Attending: Emergency Medicine | Admitting: Emergency Medicine

## 2022-02-18 ENCOUNTER — Encounter (HOSPITAL_BASED_OUTPATIENT_CLINIC_OR_DEPARTMENT_OTHER): Payer: Self-pay | Admitting: Emergency Medicine

## 2022-02-18 DIAGNOSIS — Z7982 Long term (current) use of aspirin: Secondary | ICD-10-CM | POA: Diagnosis not present

## 2022-02-18 DIAGNOSIS — Z955 Presence of coronary angioplasty implant and graft: Secondary | ICD-10-CM | POA: Diagnosis not present

## 2022-02-18 DIAGNOSIS — E119 Type 2 diabetes mellitus without complications: Secondary | ICD-10-CM | POA: Diagnosis not present

## 2022-02-18 DIAGNOSIS — I5030 Unspecified diastolic (congestive) heart failure: Secondary | ICD-10-CM | POA: Diagnosis not present

## 2022-02-18 DIAGNOSIS — T59811A Toxic effect of smoke, accidental (unintentional), initial encounter: Secondary | ICD-10-CM | POA: Diagnosis not present

## 2022-02-18 DIAGNOSIS — Z85828 Personal history of other malignant neoplasm of skin: Secondary | ICD-10-CM | POA: Diagnosis not present

## 2022-02-18 DIAGNOSIS — Z7984 Long term (current) use of oral hypoglycemic drugs: Secondary | ICD-10-CM | POA: Insufficient documentation

## 2022-02-18 DIAGNOSIS — I2510 Atherosclerotic heart disease of native coronary artery without angina pectoris: Secondary | ICD-10-CM | POA: Insufficient documentation

## 2022-02-18 MED ORDER — IPRATROPIUM-ALBUTEROL 0.5-2.5 (3) MG/3ML IN SOLN
3.0000 mL | RESPIRATORY_TRACT | Status: DC
  Administered 2022-02-19: 3 mL via RESPIRATORY_TRACT
  Filled 2022-02-18: qty 3
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
## 2022-02-19 ENCOUNTER — Emergency Department (HOSPITAL_BASED_OUTPATIENT_CLINIC_OR_DEPARTMENT_OTHER): Payer: BC Managed Care – PPO | Admitting: Radiology

## 2022-02-19 LAB — COOXEMETRY PANEL
Carboxyhemoglobin: 1.4 % (ref 0.5–1.5)
Methemoglobin: 0.7 % (ref 0.0–1.5)
O2 Saturation: 78.9 %
Total hemoglobin: 13.3 g/dL (ref 12.0–16.0)

## 2022-02-19 MED ORDER — ALBUTEROL SULFATE HFA 108 (90 BASE) MCG/ACT IN AERS
2.0000 | INHALATION_SPRAY | RESPIRATORY_TRACT | Status: DC | PRN
  Administered 2022-02-19: 2 via RESPIRATORY_TRACT
  Filled 2022-02-19: qty 6.7

## 2022-02-19 MED ORDER — AEROCHAMBER PLUS FLO-VU MISC
1.0000 | Freq: Once | Status: AC
  Administered 2022-02-19: 1
  Filled 2022-02-19: qty 1

## 2022-02-19 NOTE — ED Notes (Signed)
Patient declined use of inhaler at this time, states breathing is much better than on arrival. Patient states "I am able to get in a full breath now". Patient given an aerochamber and educated on how to use. Currently on NRB, SpO2 100%, no distress noted.

## 2022-03-25 ENCOUNTER — Other Ambulatory Visit: Payer: Self-pay | Admitting: Interventional Cardiology

## 2022-03-27 DIAGNOSIS — D696 Thrombocytopenia, unspecified: Secondary | ICD-10-CM | POA: Insufficient documentation

## 2022-03-27 DIAGNOSIS — M5136 Other intervertebral disc degeneration, lumbar region: Secondary | ICD-10-CM | POA: Insufficient documentation

## 2022-03-27 DIAGNOSIS — N529 Male erectile dysfunction, unspecified: Secondary | ICD-10-CM | POA: Insufficient documentation

## 2022-03-27 DIAGNOSIS — E291 Testicular hypofunction: Secondary | ICD-10-CM | POA: Insufficient documentation

## 2022-03-27 DIAGNOSIS — I2510 Atherosclerotic heart disease of native coronary artery without angina pectoris: Secondary | ICD-10-CM | POA: Insufficient documentation

## 2022-03-27 DIAGNOSIS — M5431 Sciatica, right side: Secondary | ICD-10-CM | POA: Insufficient documentation

## 2022-03-27 DIAGNOSIS — I252 Old myocardial infarction: Secondary | ICD-10-CM | POA: Insufficient documentation

## 2022-04-15 ENCOUNTER — Other Ambulatory Visit: Payer: Self-pay | Admitting: Interventional Cardiology

## 2022-04-15 MED ORDER — NEXLIZET 180-10 MG PO TABS: 1.0000 | ORAL_TABLET | Freq: Every day | ORAL | 0 refills | Status: DC

## 2022-04-25 ENCOUNTER — Other Ambulatory Visit: Payer: Self-pay | Admitting: Interventional Cardiology

## 2022-04-25 MED ORDER — NEXLIZET 180-10 MG PO TABS: 1.0000 | ORAL_TABLET | Freq: Every day | ORAL | 0 refills | Status: DC

## 2022-05-09 ENCOUNTER — Ambulatory Visit: Payer: BC Managed Care – PPO | Admitting: Physician Assistant

## 2022-05-14 ENCOUNTER — Encounter: Payer: Self-pay | Admitting: Physician Assistant

## 2022-05-14 ENCOUNTER — Ambulatory Visit: Payer: BC Managed Care – PPO | Attending: Physician Assistant | Admitting: Physician Assistant

## 2022-05-14 VITALS — BP 132/84 | HR 86 | Ht 69.0 in | Wt 193.6 lb

## 2022-05-14 DIAGNOSIS — E1159 Type 2 diabetes mellitus with other circulatory complications: Secondary | ICD-10-CM | POA: Diagnosis not present

## 2022-05-14 DIAGNOSIS — I25118 Atherosclerotic heart disease of native coronary artery with other forms of angina pectoris: Secondary | ICD-10-CM | POA: Diagnosis not present

## 2022-05-14 DIAGNOSIS — I340 Nonrheumatic mitral (valve) insufficiency: Secondary | ICD-10-CM

## 2022-05-14 DIAGNOSIS — Z952 Presence of prosthetic heart valve: Secondary | ICD-10-CM | POA: Diagnosis not present

## 2022-05-14 DIAGNOSIS — E785 Hyperlipidemia, unspecified: Secondary | ICD-10-CM

## 2022-05-14 MED ORDER — NEXLIZET 180-10 MG PO TABS: 1.0000 | ORAL_TABLET | Freq: Every day | ORAL | 11 refills | Status: DC

## 2022-05-14 NOTE — Patient Instructions (Signed)
Medication Instructions:  Your physician recommends that you continue on your current medications as directed. Please refer to the Current Medication list given to you today.  *If you need a refill on your cardiac medications before your next appointment, please call your pharmacy*   Lab Work: None If you have labs (blood work) drawn today and your tests are completely normal, you will receive your results only by: [place] (if you have MyChart) OR A paper copy in the mail If you have any lab test that is abnormal or we need to change your treatment, we will call you to review the results.   Testing/Procedures: Your physician has requested that you have an echocardiogram prior to 3 month follow up appointment. Echocardiography is a painless test that uses sound waves to create images of your heart. It provides your doctor with information about the size and shape of your heart and how well your heart's chambers and valves are working. This procedure takes approximately one hour. There are no restrictions for this procedure. Please do NOT wear cologne, perfume, aftershave, or lotions (deodorant is allowed). Please arrive 15 minutes prior to your appointment time.    Follow-Up: At [facility], you and your health needs are our priority.  As part of our continuing mission to provide you with exceptional heart care, we have created designated Provider Care Teams.  These Care Teams include your primary Cardiologist (physician) and Advanced Practice Providers (APPs -  Physician Assistants and Nurse Practitioners) who all work together to provide you with the care you need, when you need it.   Your next appointment:   3 month(s)  The format for your next appointment:   In Person  Provider:   Larae Grooms, MD   Important Information About Sugar

## 2022-05-14 NOTE — Progress Notes (Signed)
Office Visit    Patient Name: Roy [name] Date of Encounter: 05/14/2022  PCP:  Harlan Stains, MD   [place]  Cardiologist:  Larae Grooms, MD  Advanced Practice Provider:  No care team member to display Electrophysiologist:  None   HPI    Roy [name] is a 61 y.o. male with a past medical history significant for CAD presents today for overdue annual follow-up exam.  Patient had MI 6/13 with stent placement, symptoms were severe jaw pain and chest pain.  In 12/13 he had knee surgery and 2 days later he had an MI.  He had a stent placed at that time.  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  He feels that his valve was damaged during the cardiac cath and wanted new cardiologist, so he saw Dr. Irish Lack in 2014.  He had a tissue valve placed due to not wanting to take Coumadin.  Had question of TIA June 2018.  Turned out to be a migraine per his report.  Had an echocardiogram at that time showing normal LVEF 65 to 70%.  Wall motion normal.  Grade 1 DD, mild aortic stenosis.   Last seen 01/2021 and continued to have occasional arm pains during the workday.  Does not happen all the time.  He did have to stop working.  Today, he tells me that he is having right-sided chest pain and having some pain down his left arm with his fingers going numb.  We discussed several options including a stress test.  He states that he feels this is unnecessary at the moment and would rather not go forward.  We did discuss adding an nitroglycerin to his medications so that he has this medicine as needed.  He also will need a follow-up echocardiogram at some point to evaluate his mitral valve and with his history of AVR.  It sounds like he does not have much faith in [facility] health doctors based on my encounter with him today.  Reports no shortness of breath nor dyspnea on exertion. No edema, orthopnea, PND. Reports no  palpitations.    Past Medical History    Past Medical History:  Diagnosis Date   Aortic insufficiency    a. 07/2012 s/p AVR: #25 Magnaease Bioprosthetic Valve (performed @ time of CABG).   Arthritis    CAD (coronary artery disease) cardiologist--- dr Beau Fanny   a. 12/2011 NSTEMI/ PCI with DES to [place];  b. 06/2012 NSTEMI/ PCI with BMS to mid LCX 100%->;  c. 07/2012 Cath: dissectied LM & LCX with patent LCX/OM stents->CABG x 3: LIMA->LAD, VG->OM1->OM2.;  nuclear study 09-12-2014 no ischemia and normal LVF   Diastolic CHF ([place]) 40/9811   Diverticulosis of colon    History of diverticulitis of colon    History of kidney stones    History of non-ST elevation myocardial infarction (NSTEMI) 2013   06/ 2013 s/p DES  and 12/ 2013  s/p BMS    History of skin cancer    04-13-2020  per pt has had multiiple areas burned off by dermatologist in office,  none have been biopsy'd   Lipoma of back    upper   Mixed hyperlipidemia    Moderate mitral regurgitation    with no stenosis per echo 03-05-2018   S/P aortic valve replacement 08/02/2012   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   LIMA to LAD,  SVG to OM1 ,  SVG to OM2  Type 2 diabetes mellitus ([place])    followed by pcp  (04-13-2020  per pt checks blood sugar 3 times weekly,  fasting sugar-- 120--150)   Wears contact lenses    Past Surgical History:  Procedure Laterality Date   AORTIC VALVE REPLACEMENT  08/02/2012   Procedure: AORTIC VALVE REPLACEMENT (AVR);  Surgeon: Melrose Nakayama, MD;  Location: [place];  Service: Open Heart Surgery;  Laterality: N/A;   CARDIAC CATHETERIZATION  07/30/2012   dissection LM and CFx,  patent previous stents   CORONARY ANGIOGRAM  07/30/2012   Procedure: CORONARY ANGIOGRAM;  Surgeon: Minus Breeding, MD;  Location: [facility];  Service: Cardiovascular;;   CORONARY ANGIOPLASTY WITH STENT PLACEMENT  12-22-2011  '@[facility]'$    DES to OM1 (NSTEMI)   CORONARY ANGIOPLASTY WITH STENT PLACEMENT  07-01-2012  '@[facility]'$    BMS to midCFx (NSTEMI)    CORONARY ARTERY BYPASS GRAFT  08/02/2012   Procedure: CORONARY ARTERY BYPASS GRAFTING (CABG);  Surgeon: Melrose Nakayama, MD;  Location: [place];  Service: Open Heart Surgery;  Laterality: N/A;   ENDOVEIN HARVEST OF GREATER SAPHENOUS VEIN  08/02/2012   Procedure: ENDOVEIN HARVEST OF GREATER SAPHENOUS VEIN;  Surgeon: Melrose Nakayama, MD;  Location: [place];  Service: Open Heart Surgery;  Laterality: Left;  upper and lower leg   EXTRACORPOREAL SHOCK WAVE LITHOTRIPSY  09/2010   INTRAOPERATIVE TRANSESOPHAGEAL ECHOCARDIOGRAM  08/02/2012   Procedure: INTRAOPERATIVE TRANSESOPHAGEAL ECHOCARDIOGRAM;  Surgeon: Melrose Nakayama, MD;  Location: [place];  Service: Open Heart Surgery;  Laterality: N/A;   KERATOTOMIES Bilateral 1985   both eyes   KNEE ARTHROSCOPY  06/28/2012   Procedure: ARTHROSCOPY KNEE;  Surgeon: Vickey Huger, MD;  Location: [place];  Service: Orthopedics;  Laterality: Left;   KNEE ARTHROSCOPY Left x1  after 2013 (unsure date)   ROTATOR CUFF REPAIR Bilateral 2007  and 11/ 2017   SEPTOPLASTY  1990s   SHOULDER ARTHROSCOPY Left 12/2018   w/ decompression, debridement and manipulation   TEE WITHOUT CARDIOVERSION  07/28/2012   Procedure: TRANSESOPHAGEAL ECHOCARDIOGRAM (TEE);  Surgeon: Thayer Headings, MD;  Location: [place];  Service: Cardiovascular;  Laterality: N/A;   TONSILLECTOMY  age 69   [place] Right 1990s    Allergies  Allergies  Allergen Reactions   Contrast Media [Iodinated Contrast Media] Other (See Comments)    Pt'shad streaks up and down his arms   Topiramate Other (See Comments)   Gadolinium Derivatives Itching and Swelling    MultiHance adminstered. Itching of eyes, ears, throat. Tongue thickness.   EKGs/Labs/Other Studies Reviewed:   The following studies were reviewed today:  Echocardiogram 03/05/2018   Study Conclusions   - Valve surgery. Aortic valve replacement with a bovine    bioprosthetic valve. 65m [employer]  - Procedure  narrative: Transthoracic echocardiography. Image    quality was suboptimal. The study was technically difficult.    Intravenous contrast (Definity) was administered to opacify the    LV.  - Left ventricle: The cavity size was normal. Wall thickness was    normal. Systolic function was normal. The estimated ejection    fraction was in the range of 55% to 60%. Wall motion was normal;    there were no regional wall motion abnormalities. Doppler    parameters are consistent with abnormal left ventricular    relaxation (grade 1 diastolic dysfunction).  - Aortic valve: A prosthesis was present and functioning normally.    The prosthesis had a normal range of motion. The sewing  ring    appeared normal, had no rocking motion, and showed no evidence of    dehiscence. Peak velocity (S): 247 cm/s. Mean gradient (S): 12 mm    Hg.  - Mitral valve: There was moderate regurgitation.  - Left atrium: The atrium was normal in size. Volume/bsa, S: 27    ml/m^2.  - Pulmonary arteries: Systolic pressure was mildly increased. PA    peak pressure: 33 mm Hg (S).   Impressions:   - No cardiac source of emboli was indentified.   -------------------------------------------------------------------  Labs, prior tests, procedures, and surgery:  Transthoracic echocardiography (12/23/2016).     EF was 65%. Aortic  valve: peak gradient of 26 mm Hg and mean gradient of 15 mm Hg.   Valve surgery.     Aortic valve replacement with a bovine  bioprosthetic valve. 46m [employer]   -------------------------------------------------------------------  Study data:  Comparison was made to the study of 12/23/2016.  Study  status:  Routine.  Procedure:  The patient reported no pain pre or  post test. Transthoracic echocardiography. Image quality was  suboptimal. The study was technically difficult, as a result of  poor acoustic windows and poor sound wave transmission. Intravenous  contrast (Definity) was  administered to opacify the LV.  Study  completion:  There were no complications.          Transthoracic  echocardiography.  M-mode, complete 2D, spectral Doppler, color  Doppler, and intravenous contrast injection.  Birthdate:  Patient  birthdate: 004-22-1962  Age:  Patient is 61yr old.  Sex:  Gender:  male.    BMI: 32.9 kg/m^2.  Blood pressure:     122/78  Patient  status:  Outpatient.  Study date:  Study date: 03/05/2018. Study  time: 07:21 AM.  [facility]   -------------------------------------------------------------------   -------------------------------------------------------------------  Left ventricle:  The cavity size was normal. Wall thickness was  normal. Systolic function was normal. The estimated ejection  fraction was in the range of 55% to 60%. Wall motion was normal;  there were no regional wall motion abnormalities. Doppler  parameters are consistent with abnormal left ventricular relaxation  (grade 1 diastolic dysfunction).   -------------------------------------------------------------------  Aortic valve:   Normal thickness leaflets. A prosthesis was present  and functioning normally. The prosthesis had a normal range of  motion. The sewing ring appeared normal, had no rocking motion, and  showed no evidence of dehiscence. Cusp separation was normal.  Mobility was not restricted.  Doppler:  Transvalvular velocity was  within the normal range. There was no stenosis. There was no  regurgitation.    VTI ratio of LVOT to aortic valve: 0.51. Peak  velocity ratio of LVOT to aortic valve: 0.5. Mean velocity ratio of  LVOT to aortic valve: 0.55.    Mean gradient (S): 12 mm Hg. Peak  gradient (S): 24 mm Hg.   -------------------------------------------------------------------  Aorta:  Aortic root: The aortic root was normal in size.   -------------------------------------------------------------------  Mitral valve:   Structurally normal valve.    Mobility was not  restricted.  Doppler:  Transvalvular velocity was within the normal  range. There was no evidence for stenosis. There was moderate  regurgitation.    Valve area by pressure half-time: 3.44 cm^2.  Indexed valve area by pressure half-time: 1.53 cm^2/m^2.    Peak  gradient (D): 4 mm Hg.   -------------------------------------------------------------------  Left atrium:  The atrium was normal in size.   -------------------------------------------------------------------  Right ventricle:  The cavity size was normal. Wall thickness was  normal. Systolic function was normal.   -------------------------------------------------------------------  Pulmonic valve:   Poorly visualized.  Structurally normal valve.  Cusp separation was normal.  Doppler:  Transvalvular velocity was  within the normal range. There was no evidence for stenosis. There  was no regurgitation.   -------------------------------------------------------------------  Tricuspid valve:   Structurally normal valve.    Doppler:  Transvalvular velocity was within the normal range. There was mild  regurgitation.   -------------------------------------------------------------------  Pulmonary artery:   The main pulmonary artery was normal-sized.  Systolic pressure was mildly increased.   -------------------------------------------------------------------  Right atrium:  The atrium was normal in size.   -------------------------------------------------------------------  Pericardium:  There was no pericardial effusion.   -------------------------------------------------------------------  Systemic veins:  Inferior vena cava: The vessel was normal in size.   EKG:  EKG is not ordered today.   Recent Labs: No results found for requested labs within last 365 days.  Recent Lipid Panel    Component Value Date/Time   CHOL 157 07/31/2012 0319   CHOL 105 07/01/2012 0019   TRIG 39 07/31/2012 0319   TRIG 47  07/01/2012 0019   HDL 54 07/31/2012 0319   HDL 35 (L) 07/01/2012 0019   CHOLHDL 2.9 07/31/2012 0319   VLDL 8 07/31/2012 0319   VLDL 9 07/01/2012 0019   LDLCALC 95 07/31/2012 0319   LDLCALC 61 07/01/2012 0019    Home Medications   Current Meds  Medication Sig   amoxicillin (AMOXIL) 500 MG capsule Take 4 tablets (2,'000mg'$ ) by mouth one hour prior to dental work.   aspirin EC 81 MG tablet Take 1 tablet (81 mg total) by mouth daily.   Glucosamine 500 MG CAPS Take by mouth daily.   JARDIANCE 25 MG TABS tablet Take 25 mg by mouth daily.    meloxicam (MOBIC) 15 MG tablet Take 15 mg by mouth as needed.   metFORMIN (GLUCOPHAGE-XR) 500 MG 24 hr tablet Take 1,000 mg by mouth daily with breakfast.    ONETOUCH ULTRA test strip    pioglitazone (ACTOS) 45 MG tablet Take 45 mg by mouth daily.   pravastatin (PRAVACHOL) 20 MG tablet Take 1 tablet (20 mg total) by mouth every evening. Please call to schedule an overdue appointment with Dr. Irish Lack for refills, [phone], thank you. 2ND attempt.   RYBELSUS 7 MG TABS Take 7 mg by mouth daily.    Turmeric (QC TUMERIC COMPLEX PO) Take 500 mg by mouth daily.   [DISCONTINUED] Bempedoic Acid-Ezetimibe (NEXLIZET) 180-10 MG TABS Take 1 tablet by mouth daily.     Review of Systems      All other systems reviewed and are otherwise negative except as noted above.  Physical Exam    VS:  BP 132/84   Pulse 86   Ht '5\' 9"'$  (1.753 m)   Wt 193 lb 9.6 oz (87.8 kg)   SpO2 98%   BMI 28.59 kg/m  , BMI Body mass index is 28.59 kg/m.  Wt Readings from Last 3 Encounters:  05/14/22 193 lb 9.6 oz (87.8 kg)  05/15/21 188 lb (85.3 kg)  01/24/21 192 lb (87.1 kg)     GEN: Well nourished, well developed, in no acute distress. HEENT: normal. Neck: Supple, no JVD, carotid bruits, or masses. Cardiac: RRR, + systolic murmurs, rubs, or gallops. No clubbing, cyanosis, edema.  Radials/PT 2+ and equal bilaterally.  Respiratory:  Respirations regular and unlabored, clear  to auscultation bilaterally. GI: Soft, nontender, nondistended. MS: No  deformity or atrophy. Skin: Warm and dry, no rash. Neuro:  Strength and sensation are intact. Psych: Normal affect.  Assessment & Plan    CAD -Having some right-sided chest pains today with left-sided pain and numbness. -Deferred further work-up at this time, offered stress test -He will let us know if this pain becomes worse or he has more frequent episodes -Continue current medication regimen which includes aspirin 81 mg daily, next visit 180-10 mg daily, Jardiance 25 mg daily, pravastatin 20 mg daily -We added nitroglycerin tabs as needed for chest pain   S/P AVR -Recommended follow-up echocardiogram in the next few months -Last echocardiogram reviewed, 2019  HLD -Most recent lipid panel shows LDL 75, HDL 43, total cholesterol 136, triglycerides 100 -We will be due for follow-up lipid panel at his next office appointment  Moderate MR -Plan for follow-up echocardiogram in the next few months to reevaluate     Disposition: Follow up 3 months with Larae Grooms, MD or APP.  Signed, Elgie Collard, PA-C 05/14/2022, 6:12 PM [facility]

## 2022-05-26 ENCOUNTER — Other Ambulatory Visit: Payer: Self-pay | Admitting: Sports Medicine

## 2022-05-26 DIAGNOSIS — G8929 Other chronic pain: Secondary | ICD-10-CM

## 2022-05-28 ENCOUNTER — Ambulatory Visit
Admission: RE | Admit: 2022-05-28 | Discharge: 2022-05-28 | Disposition: A | Discharge status: Home / Self Care | Payer: BC Managed Care – PPO | Source: Ambulatory Visit | Attending: Sports Medicine | Admitting: Sports Medicine

## 2022-05-28 DIAGNOSIS — G8929 Other chronic pain: Secondary | ICD-10-CM

## 2022-06-09 ENCOUNTER — Telehealth: Payer: Self-pay | Admitting: Interventional Cardiology

## 2022-06-09 DIAGNOSIS — Z01818 Encounter for other preprocedural examination: Secondary | ICD-10-CM

## 2022-06-09 DIAGNOSIS — I2510 Atherosclerotic heart disease of native coronary artery without angina pectoris: Secondary | ICD-10-CM

## 2022-06-09 NOTE — Telephone Encounter (Signed)
I spoke with patient. He reports anesthesiology will not do his scheduled knee surgery because last office visit noted possibility of stress test.  Patient states he did not "go into detail" about upcoming knee surgery at recent office visit.  Patient will now consider having stress test if needed so he can have surgery. He reports he does not think he can do stress test due to knee issue.  I told him there is a type of stress test that does not require walking on the treadmill.  I talked with patient about surgical clearance and told him we had not received a clearance request.  He reports he was given a paper today which may be a clearance request.  He will bring paper to office or have surgeon fax request to the office.

## 2022-06-09 NOTE — Telephone Encounter (Signed)
Patient is calling about his surgery that is scheduled for Wednesday.  He said he can't have his surgery because someone in his note it has that he needs to have a stress test prior to his surgery.

## 2022-06-09 NOTE — Telephone Encounter (Signed)
Patient wants call back regarding getting a stress test before he can have surgery on his knee.  Patient is concerned his surgery is being delayed.

## 2022-06-10 ENCOUNTER — Telehealth: Payer: Self-pay | Admitting: *Deleted

## 2022-06-10 NOTE — Telephone Encounter (Signed)
I spoke with patient and gave him message from Dr Irish Lack.  He would like to proceed with lexiscan. I verbally went over instructions with patient.   Patient reports he dropped cardiac clearance paperwork off today

## 2022-06-10 NOTE — Telephone Encounter (Signed)
   Pre-operative Risk Assessment    Patient Name: ABDULKADIR EMMANUEL  DOB: 1960-09-18 MRN: [id number]      Request for Surgical Clearance    Procedure:   RIGHT KNEE SCOPE, MENISCECTOMY  Date of Surgery:  Clearance TBD                                 Surgeon:  DR. Lara Mulch Surgeon's Group or Practice Name:  [facility]  Phone number:  [phone] Fax number:  [phone]   Type of Clearance Requested:   - Medical ; ASA    Type of Anesthesia:   CHOICE   Additional requests/questions:    Jiles Prows   06/10/2022, 4:41 PM

## 2022-06-10 NOTE — Telephone Encounter (Signed)
Consideration of stress test was based on arm pain he was describing.  Is he still having arm pain?

## 2022-06-10 NOTE — Telephone Encounter (Signed)
Yes he said he was having arm pain and that he has had it for a long time.

## 2022-06-10 NOTE — Telephone Encounter (Signed)
OK to order a lexiscan myoview, CAD, preoperative cardiovascular exam

## 2022-06-11 NOTE — Telephone Encounter (Signed)
Patient was recently seen in the office by Nicholes Rough, PA-C, on 05/14/2022 and reported some right sided chest pain as well as some pain down his left arm. Myoview was recommended by patient declined. Echo was ordered for routine follow-up of AVR. Patient is now needing knee surgery but anesthesiology reportedly will not do this unless he has a stress test given his reports of chest pain at last visit. Patient now agreeable to a stress test. Echo scheduled for 06/26/2022. No date for Myoview yet. Will need to wait for results of both before providing risk assessment.  Darreld Mclean, PA-C 06/11/2022 11:43 AM

## 2022-06-16 NOTE — Telephone Encounter (Signed)
Left message on voicemail per DPR in reference to upcoming appointment scheduled on 06/17/22  with detailed instructions given per Myocardial Perfusion Study Information Sheet for the test. LM to arrive 15 minutes early, and that it is imperative to arrive on time for appointment to keep from having the test rescheduled. If you need to cancel or reschedule your appointment, please call the office within 24 hours of your appointment. Failure to do so may result in a cancellation of your appointment, and a $50 no show fee. Phone number given for call back for any questions. Kirstie Peri

## 2022-06-17 ENCOUNTER — Ambulatory Visit (HOSPITAL_COMMUNITY): Payer: BC Managed Care – PPO | Attending: Interventional Cardiology

## 2022-06-17 DIAGNOSIS — I2510 Atherosclerotic heart disease of native coronary artery without angina pectoris: Secondary | ICD-10-CM

## 2022-06-17 DIAGNOSIS — Z01818 Encounter for other preprocedural examination: Secondary | ICD-10-CM | POA: Diagnosis present

## 2022-06-17 LAB — MYOCARDIAL PERFUSION IMAGING
LV dias vol: 66 mL (ref 62–150)
LV sys vol: 26 mL
Nuc Stress EF: 61 %
Peak HR: 95 {beats}/min
Rest HR: 72 {beats}/min
Rest Nuclear Isotope Dose: 10.8 mCi
SDS: 1
SRS: 0
SSS: 1
ST Depression (mm): 0 mm
Stress Nuclear Isotope Dose: 31.4 mCi
TID: 0.96

## 2022-06-17 MED ORDER — REGADENOSON 0.4 MG/5ML IV SOLN
0.4000 mg | Freq: Once | INTRAVENOUS | Status: AC
  Administered 2022-06-17: 0.4 mg via INTRAVENOUS

## 2022-06-17 MED ORDER — TECHNETIUM TC 99M TETROFOSMIN IV KIT
31.4000 | PACK | Freq: Once | INTRAVENOUS | Status: AC | PRN
  Administered 2022-06-17: 31.4 via INTRAVENOUS

## 2022-06-17 MED ORDER — TECHNETIUM TC 99M TETROFOSMIN IV KIT
10.8000 | PACK | Freq: Once | INTRAVENOUS | Status: AC | PRN
  Administered 2022-06-17: 10.8 via INTRAVENOUS

## 2022-06-17 NOTE — Telephone Encounter (Signed)
   Name: Roy [name]  DOB: Nov 25, 1960  MRN: [id number]   Primary Cardiologist: Larae Grooms, MD  Chart reviewed as part of pre-operative protocol coverage.    Pt underwent nuclear stress test 06/17/22 that was low risk. Per Dr. Irish Lack, No further cardiac testing needed prior to surgery.   Given the patient's comorbid conditions, we prefer to continue ASA throughout the perioperative period. However, if doing so significantly increases morbidity or mortality, may hold for 5-7 days.  I will route this recommendation to the requesting party via Epic fax function and remove from pre-op pool. Please call with questions.  Ledora Bottcher, PA 06/17/2022, 4:49 PM

## 2022-06-26 ENCOUNTER — Other Ambulatory Visit (HOSPITAL_COMMUNITY): Payer: BC Managed Care – PPO

## 2022-07-17 ENCOUNTER — Ambulatory Visit (HOSPITAL_COMMUNITY): Payer: BC Managed Care – PPO | Attending: Physician Assistant

## 2022-07-17 DIAGNOSIS — Z952 Presence of prosthetic heart valve: Secondary | ICD-10-CM | POA: Diagnosis present

## 2022-07-17 DIAGNOSIS — I340 Nonrheumatic mitral (valve) insufficiency: Secondary | ICD-10-CM | POA: Diagnosis present

## 2022-07-17 LAB — ECHOCARDIOGRAM COMPLETE
AR max vel: 1.52 cm2
AV Area VTI: 1.68 cm2
AV Area mean vel: 1.62 cm2
AV Mean grad: 10 mmHg
AV Peak grad: 21.2 mmHg
Ao pk vel: 2.3 m/s
Area-P 1/2: 2.93 cm2
S' Lateral: 2.5 cm

## 2022-08-11 ENCOUNTER — Ambulatory Visit: Payer: BC Managed Care – PPO | Admitting: Interventional Cardiology

## 2022-12-29 LAB — LAB REPORT - SCANNED
A1c: 9.7
Albumin, Urine POC: 21.5
Creatinine, POC: 48.4 mg/dL
EGFR: 72
Microalb Creat Ratio: 44

## 2022-12-31 ENCOUNTER — Encounter: Payer: Self-pay | Admitting: Internal Medicine

## 2023-03-06 ENCOUNTER — Telehealth: Payer: Self-pay | Admitting: Interventional Cardiology

## 2023-03-06 NOTE — Telephone Encounter (Signed)
I spoke with patient and let him know our records indicate he is taking Pravastatin 20 mg daily.  He thinks increase may have been made by PCP.  He will contact PCP's office for refill.

## 2023-03-06 NOTE — Telephone Encounter (Signed)
Pt c/o medication issue:  1. Name of Medication:   pravastatin (PRAVACHOL) 20 MG tablet   2. How are you currently taking this medication (dosage and times per day)?   Taking 2 tablets daily  3. Are you having a reaction (difficulty breathing--STAT)?   No  4. What is your medication issue?   Patient stated he has been taking two 20 mg tablets daily.  Patient stated he wants to get 40 mg tablets instead to take just one tablet daily.  Patient stated he is out of this medication and wants refill sent to [id number] - [place], [place] - [address].

## 2023-04-23 NOTE — Progress Notes (Unsigned)
Cardiology Office Note:    Date:  04/23/2023   ID:  Roy [name], DOB 1960/09/09, MRN [id number]  PCP:  Laurann Montana, MD   [facility] Providers Cardiologist:  Raeghan Demeter [place] MD Click to update primary MD,subspecialty MD or APP then REFRESH:1}    Referring MD: Laurann Montana, MD   No chief complaint on file. ***  History of Present Illness:    Roy [name] is a 62 y.o. male seen for evaluation of CAD and prior AV disease. He is a former patient of Dr Eldridge Dace. He has a family h/o CAD. He had an acute MI in 6/13 with a DES placed in the LCx. In 12/13, he had knee surgery and 2 days later, he had another acute MI. The LCx was occluded and was restented with BMS x 2. On subsequent follow up he complained of refractory SOB and Echo showed normal EF with severe AI. He underwent repeat cardiac cath and TEE. 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 He underwent surgery with Dr Dorris Fetch including AVR with #25 Magna Ease valve and CABG with LIMA to the LAD and sequential SVG to 2 OMs. Did not want to take coumadin so bioprosthetic valve placed. He has done well from a cardiac standpoint since then. Myoview study in Nov 2023 was normal. Echo in Dec 2023 showed normal LV function. Normal functioning AV prosthesis. He does have a history of HLD, DM type 2. Reports history of contrast allergy.  Past Medical History:  Diagnosis Date   Aortic insufficiency    a. 07/2012 s/p AVR: #25 Magnaease Bioprosthetic Valve (performed @ time of CABG).   Arthritis    CAD (coronary artery disease) cardiologist--- dr Abe People   a. 12/2011 NSTEMI/ PCI with DES to OM1;  b. 06/2012 NSTEMI/ PCI with BMS to mid LCX 100%->;  c. 07/2012 Cath: dissectied LM & LCX with patent LCX/OM stents->CABG x 3: LIMA->LAD, VG->OM1->OM2.;  nuclear study 09-12-2014 no ischemia and normal LVF   Diastolic CHF (HCC) 06/2012   Diverticulosis of colon    History of diverticulitis of colon    History of  kidney stones    History of non-ST elevation myocardial infarction (NSTEMI) 2013   06/ 2013 s/p DES  and 12/ 2013  s/p BMS    History of skin cancer    04-13-2020  per pt has had multiiple areas burned off by dermatologist in office,  none have been biopsy'd   Lipoma of back    upper   Mixed hyperlipidemia    Moderate mitral regurgitation    with no stenosis per echo 03-05-2018   S/P aortic valve replacement 08/02/2012   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   LIMA to LAD,  SVG to OM1 ,  SVG to OM2   Type 2 diabetes mellitus (HCC)    followed by pcp  (04-13-2020  per pt checks blood sugar 3 times weekly,  fasting sugar-- 120--150)   Wears contact lenses     Past Surgical History:  Procedure Laterality Date   AORTIC VALVE REPLACEMENT  08/02/2012   Procedure: AORTIC VALVE REPLACEMENT (AVR);  Surgeon: Loreli Slot, MD;  Location: [facility] OR;  Service: Open Heart Surgery;  Laterality: N/A;   CARDIAC CATHETERIZATION  07/30/2012   dissection LM and CFx,  patent previous stents   CORONARY ANGIOGRAM  07/30/2012   Procedure: CORONARY ANGIOGRAM;  Surgeon: [name], MD;  Location: [facility] CATH LAB;  Service: Cardiovascular;;   CORONARY  ANGIOPLASTY WITH STENT PLACEMENT  12-22-2011  @[facility]    DES to OM1 (NSTEMI)   CORONARY ANGIOPLASTY WITH STENT PLACEMENT  07-01-2012  @[facility]    BMS to midCFx (NSTEMI)   CORONARY ARTERY BYPASS GRAFT  08/02/2012   Procedure: CORONARY ARTERY BYPASS GRAFTING (CABG);  Surgeon: Loreli Slot, MD;  Location[facility] OR;  Service: Open Heart Surgery;  Laterality: N/A;   ENDOVEIN HARVEST OF GREATER SAPHENOUS VEIN  08/02/2012   Procedure: ENDOVEIN HARVEST OF GREATER SAPHENOUS VEIN;  Surgeon: Loreli Slot, MD;  Location: [facility] OR;  Service: Open Heart Surgery;  Laterality: Left;  upper and lower leg   EXTRACORPOREAL SHOCK WAVE LITHOTRIPSY  09/2010   INTRAOPERATIVE TRANSESOPHAGEAL ECHOCARDIOGRAM  08/02/2012   Procedure: INTRAOPERATIVE TRANSESOPHAGEAL ECHOCARDIOGRAM;  Surgeon: Loreli Slot, MD;  Location: [facility] OR;  Service: Open Heart Surgery;  Laterality: N/A;   KERATOTOMIES Bilateral 1985   both eyes   KNEE ARTHROSCOPY  06/28/2012   Procedure: ARTHROSCOPY KNEE;  Surgeon: Dannielle Huh, MD;  Location: [facility] OR;  Service: Orthopedics;  Laterality: Left;   KNEE ARTHROSCOPY Left x1  after 2013 (unsure date)   ROTATOR CUFF REPAIR Bilateral 2007  and 11/ 2017   SEPTOPLASTY  1990s   SHOULDER ARTHROSCOPY Left 12/2018   w/ decompression, debridement and manipulation   TEE WITHOUT CARDIOVERSION  07/28/2012   Procedure: TRANSESOPHAGEAL ECHOCARDIOGRAM (TEE);  Surgeon: Vesta Mixer, MD;  Location: [facility] ENDOSCOPY;  Service: Cardiovascular;  Laterality: N/A;   TONSILLECTOMY  age 71   ULNAR TUNNEL RELEASE Right 1990s    Current Medications: No outpatient medications have been marked as taking for the 04/30/23 encounter (Appointment) with [place], Mischele Detter M, MD.     Allergies:   Contrast media [iodinated contrast media], Topiramate, and Gadolinium derivatives   Social History   Socioeconomic History   Marital status: Married    Spouse name: Research scientist (physical sciences)   Number of children: 3   Years of education: 16   Highest education level: Not on file  Occupational History   Occupation: [employer]  Tobacco Use   Smoking status: Never   Smokeless tobacco: Never  Vaping Use   Vaping status: Never Used  Substance and Sexual Activity   Alcohol use: No   Drug use: Never   Sexual activity: Not on file  Other Topics Concern   Not on file  Social History Narrative   Lives with wife   Caffeine use: 1 cup /day   Social Determinants of Health   Financial Resource Strain: Not on file  Food Insecurity: Not on file  Transportation Needs: Not on file  Physical Activity: Not on file  Stress: Not on file  Social Connections: Not on file     Family History: The patient's ***family history includes Coronary artery disease in his mother; Diabetes in his paternal grandmother; Heart attack in his  paternal grandfather; Heart disease in his father; Hyperlipidemia in his brother and another family member; Lung cancer in his mother; Melanoma in an other family member; Migraines in his father.  ROS:   Please see the history of present illness.    *** All other systems reviewed and are negative.  EKGs/Labs/Other Studies Reviewed:    The following studies were reviewed today:  Myoview 06/17/22:Study Highlights      The study is normal. The study is low risk.   No ST deviation was noted.   Left ventricular function is normal. End diastolic cavity size is normal. End systolic cavity size is normal.  Prior study available for comparison from 09/12/2014.   Diaphragmatic attenuation No ischemia/infarct Estimated EF 61%   Charlton Haws MD [facility]   Echo 07/17/22: IMPRESSIONS     1. Left ventricular ejection fraction, by estimation, is 60 to 65%. The  left ventricle has normal function. The left ventricle has no regional  wall motion abnormalities. There is mild concentric left ventricular  hypertrophy. Left ventricular diastolic  parameters are consistent with Grade I diastolic dysfunction (impaired  relaxation).   2. Right ventricular systolic function is normal. The right ventricular  size is normal. Tricuspid regurgitation signal is inadequate for assessing  PA pressure.   3. The mitral valve is normal in structure. Trivial mitral valve  regurgitation.   4. The aortic valve has been repaired/replaced. Aortic valve  regurgitation is not visualized. There is a 25 mm [employer] valve  present in the aortic position. Echo findings are consistent with normal  structure and function of the aortic valve  prosthesis. Aortic valve mean gradient measures 10.0 mmHg. Aortic valve  Vmax measures 2.30 m/s. EOA 2.1cm2, DI 0.6.   5. Aortic dilatation noted. There is borderline dilatation of the  ascending aorta, measuring 37 mm.   6. The inferior vena cava is normal in size with  greater than 50%  respiratory variability, suggesting right atrial pressure of 3 mmHg.   Comparison(s): No significant change from prior TTE in 2019. Prior mean  AoV gradient .        Recent Labs: No results found for requested labs within last 365 days.  Recent Lipid Panel    Component Value Date/Time   CHOL 157 07/31/2012 0319   CHOL 105 07/01/2012 0019   TRIG 39 07/31/2012 0319   TRIG 47 07/01/2012 0019   HDL 54 07/31/2012 0319   HDL 35 (L) 07/01/2012 0019   CHOLHDL 2.9 07/31/2012 0319   VLDL 8 07/31/2012 0319   VLDL 9 07/01/2012 0019   LDLCALC 95 07/31/2012 0319   LDLCALC 61 07/01/2012 0019   Dated 04/04/21: cholesterol 13, triglycerides 100, HDL 43, LDL 75. LFTs normal.   Risk Assessment/Calculations:   {Does this patient have ATRIAL FIBRILLATION?:[phone]}  No BP recorded.  {Refresh Note OR Click here to enter BP  :1}***         Physical Exam:    VS:  There were no vitals taken for this visit.    Wt Readings from Last 3 Encounters:  06/17/22 193 lb (87.5 kg)  05/14/22 193 lb 9.6 oz (87.8 kg)  05/15/21 188 lb (85.3 kg)     GEN: *** Well nourished, well developed in no acute distress HEENT: Normal NECK: No JVD; No carotid bruits LYMPHATICS: No lymphadenopathy CARDIAC: ***RRR, no murmurs, rubs, gallops RESPIRATORY:  Clear to auscultation without rales, wheezing or rhonchi  ABDOMEN: Soft, non-tender, non-distended MUSCULOSKELETAL:  No edema; No deformity  SKIN: Warm and dry NEUROLOGIC:  Alert and oriented x 3 PSYCHIATRIC:  Normal affect   ASSESSMENT:    No diagnosis found. PLAN:    In order of problems listed above:  ***      {Are you ordering a CV Procedure (e.g. stress test, cath, DCCV, TEE, etc)?   Press F2        :[id number]}    Medication Adjustments/Labs and Tests Ordered: Current medicines are reviewed at length with the patient today.  Concerns regarding medicines are outlined above.  No orders of the defined types were placed in  this encounter.  No orders of the  defined types were placed in this encounter.   There are no Patient Instructions on file for this visit.   Signed, Cedarius Kersh [place], MD  04/23/2023 12:57 PM    [facility]

## 2023-04-30 ENCOUNTER — Ambulatory Visit: Payer: BC Managed Care – PPO | Attending: Cardiology | Admitting: Cardiology

## 2023-04-30 ENCOUNTER — Encounter: Payer: Self-pay | Admitting: Cardiology

## 2023-04-30 VITALS — BP 130/78 | HR 72 | Ht 69.0 in | Wt 193.0 lb

## 2023-04-30 DIAGNOSIS — I10 Essential (primary) hypertension: Secondary | ICD-10-CM

## 2023-04-30 DIAGNOSIS — E1159 Type 2 diabetes mellitus with other circulatory complications: Secondary | ICD-10-CM

## 2023-04-30 DIAGNOSIS — I252 Old myocardial infarction: Secondary | ICD-10-CM

## 2023-04-30 DIAGNOSIS — Z9861 Coronary angioplasty status: Secondary | ICD-10-CM

## 2023-04-30 DIAGNOSIS — E785 Hyperlipidemia, unspecified: Secondary | ICD-10-CM

## 2023-04-30 DIAGNOSIS — Z952 Presence of prosthetic heart valve: Secondary | ICD-10-CM

## 2023-04-30 DIAGNOSIS — I2510 Atherosclerotic heart disease of native coronary artery without angina pectoris: Secondary | ICD-10-CM | POA: Diagnosis not present

## 2023-04-30 MED ORDER — NEXLIZET 180-10 MG PO TABS: 1.0000 | ORAL_TABLET | Freq: Every day | ORAL | 3 refills | Status: DC

## 2023-04-30 MED ORDER — AMOXICILLIN 500 MG PO CAPS: ORAL_CAPSULE | ORAL | 2 refills | Status: DC

## 2023-04-30 NOTE — Patient Instructions (Signed)
Medication Instructions:  Start Nexlizet 180/10 mg daily Continue all other medications *If you need a refill on your cardiac medications before your next appointment, please call your pharmacy*   Lab Work: Have labs repeated with Primary Care   Testing/Procedures: None ordered   Follow-Up: At [facility], you and your health needs are our priority.  As part of our continuing mission to provide you with exceptional heart care, we have created designated Provider Care Teams.  These Care Teams include your primary Cardiologist (physician) and Advanced Practice Providers (APPs -  Physician Assistants and Nurse Practitioners) who all work together to provide you with the care you need, when you need it.  We recommend signing up for the patient portal called "MyChart".  Sign up information is provided on this After Visit Summary.  MyChart is used to connect with patients for Virtual Visits (Telemedicine).  Patients are able to view lab/test results, encounter notes, upcoming appointments, etc.  Non-urgent messages can be sent to your provider as well.   To learn more about what you can do with MyChart, go to [email or web address].    Your next appointment:  6 months     Call in Jan to schedule April appointment    Provider:  Dr.Jordan

## 2023-05-24 ENCOUNTER — Other Ambulatory Visit: Payer: Self-pay

## 2023-05-24 ENCOUNTER — Emergency Department (HOSPITAL_COMMUNITY): Payer: BC Managed Care – PPO

## 2023-05-24 ENCOUNTER — Inpatient Hospital Stay (HOSPITAL_COMMUNITY)
Admission: EM | Admit: 2023-05-24 | Discharge: 2023-05-26 | DRG: 872 | Disposition: A | Discharge status: Home / Self Care | Payer: BC Managed Care – PPO | Attending: Student | Admitting: Student

## 2023-05-24 ENCOUNTER — Encounter (HOSPITAL_COMMUNITY): Payer: Self-pay

## 2023-05-24 DIAGNOSIS — Z91041 Radiographic dye allergy status: Secondary | ICD-10-CM | POA: Diagnosis not present

## 2023-05-24 DIAGNOSIS — A419 Sepsis, unspecified organism: Principal | ICD-10-CM | POA: Diagnosis present

## 2023-05-24 DIAGNOSIS — D72819 Decreased white blood cell count, unspecified: Secondary | ICD-10-CM | POA: Diagnosis present

## 2023-05-24 DIAGNOSIS — N390 Urinary tract infection, site not specified: Secondary | ICD-10-CM | POA: Insufficient documentation

## 2023-05-24 DIAGNOSIS — Z83438 Family history of other disorder of lipoprotein metabolism and other lipidemia: Secondary | ICD-10-CM

## 2023-05-24 DIAGNOSIS — Z955 Presence of coronary angioplasty implant and graft: Secondary | ICD-10-CM | POA: Diagnosis not present

## 2023-05-24 DIAGNOSIS — E1165 Type 2 diabetes mellitus with hyperglycemia: Secondary | ICD-10-CM | POA: Diagnosis present

## 2023-05-24 DIAGNOSIS — Z888 Allergy status to other drugs, medicaments and biological substances status: Secondary | ICD-10-CM

## 2023-05-24 DIAGNOSIS — E1159 Type 2 diabetes mellitus with other circulatory complications: Secondary | ICD-10-CM | POA: Diagnosis not present

## 2023-05-24 DIAGNOSIS — I959 Hypotension, unspecified: Secondary | ICD-10-CM | POA: Diagnosis present

## 2023-05-24 DIAGNOSIS — Z1152 Encounter for screening for COVID-19: Secondary | ICD-10-CM

## 2023-05-24 DIAGNOSIS — Z79899 Other long term (current) drug therapy: Secondary | ICD-10-CM | POA: Diagnosis not present

## 2023-05-24 DIAGNOSIS — N200 Calculus of kidney: Secondary | ICD-10-CM | POA: Diagnosis present

## 2023-05-24 DIAGNOSIS — Y92488 Other paved roadways as the place of occurrence of the external cause: Secondary | ICD-10-CM | POA: Diagnosis not present

## 2023-05-24 DIAGNOSIS — D696 Thrombocytopenia, unspecified: Secondary | ICD-10-CM | POA: Diagnosis present

## 2023-05-24 DIAGNOSIS — I252 Old myocardial infarction: Secondary | ICD-10-CM

## 2023-05-24 DIAGNOSIS — Z951 Presence of aortocoronary bypass graft: Secondary | ICD-10-CM | POA: Diagnosis not present

## 2023-05-24 DIAGNOSIS — Z9861 Coronary angioplasty status: Secondary | ICD-10-CM | POA: Diagnosis not present

## 2023-05-24 DIAGNOSIS — E872 Acidosis, unspecified: Secondary | ICD-10-CM | POA: Diagnosis present

## 2023-05-24 DIAGNOSIS — N179 Acute kidney failure, unspecified: Secondary | ICD-10-CM | POA: Diagnosis present

## 2023-05-24 DIAGNOSIS — Z833 Family history of diabetes mellitus: Secondary | ICD-10-CM

## 2023-05-24 DIAGNOSIS — Z8249 Family history of ischemic heart disease and other diseases of the circulatory system: Secondary | ICD-10-CM | POA: Diagnosis not present

## 2023-05-24 DIAGNOSIS — E119 Type 2 diabetes mellitus without complications: Secondary | ICD-10-CM

## 2023-05-24 DIAGNOSIS — Z7982 Long term (current) use of aspirin: Secondary | ICD-10-CM

## 2023-05-24 DIAGNOSIS — Z7984 Long term (current) use of oral hypoglycemic drugs: Secondary | ICD-10-CM

## 2023-05-24 DIAGNOSIS — I5032 Chronic diastolic (congestive) heart failure: Secondary | ICD-10-CM | POA: Diagnosis present

## 2023-05-24 DIAGNOSIS — N3001 Acute cystitis with hematuria: Principal | ICD-10-CM

## 2023-05-24 DIAGNOSIS — E782 Mixed hyperlipidemia: Secondary | ICD-10-CM | POA: Diagnosis present

## 2023-05-24 DIAGNOSIS — Z801 Family history of malignant neoplasm of trachea, bronchus and lung: Secondary | ICD-10-CM

## 2023-05-24 DIAGNOSIS — Z953 Presence of xenogenic heart valve: Secondary | ICD-10-CM | POA: Diagnosis not present

## 2023-05-24 DIAGNOSIS — I2510 Atherosclerotic heart disease of native coronary artery without angina pectoris: Secondary | ICD-10-CM | POA: Diagnosis present

## 2023-05-24 DIAGNOSIS — Z808 Family history of malignant neoplasm of other organs or systems: Secondary | ICD-10-CM

## 2023-05-24 DIAGNOSIS — R6520 Severe sepsis without septic shock: Secondary | ICD-10-CM | POA: Diagnosis present

## 2023-05-24 DIAGNOSIS — Z85828 Personal history of other malignant neoplasm of skin: Secondary | ICD-10-CM | POA: Diagnosis not present

## 2023-05-24 DIAGNOSIS — N210 Calculus in bladder: Secondary | ICD-10-CM | POA: Diagnosis present

## 2023-05-24 DIAGNOSIS — I4510 Unspecified right bundle-branch block: Secondary | ICD-10-CM | POA: Diagnosis present

## 2023-05-24 LAB — CBC WITH DIFFERENTIAL/PLATELET
Abs Immature Granulocytes: 0.01 10*3/uL (ref 0.00–0.07)
Basophils Absolute: 0 10*3/uL (ref 0.0–0.1)
Basophils Relative: 0 %
Eosinophils Absolute: 0 10*3/uL (ref 0.0–0.5)
Eosinophils Relative: 0 %
HCT: 42.2 % (ref 39.0–52.0)
Hemoglobin: 13.6 g/dL (ref 13.0–17.0)
Immature Granulocytes: 0 %
Lymphocytes Relative: 7 %
Lymphs Abs: 0.2 10*3/uL — ABNORMAL LOW (ref 0.7–4.0)
MCH: 28.8 pg (ref 26.0–34.0)
MCHC: 32.2 g/dL (ref 30.0–36.0)
MCV: 89.2 fL (ref 80.0–100.0)
Monocytes Absolute: 0 10*3/uL — ABNORMAL LOW (ref 0.1–1.0)
Monocytes Relative: 1 %
Neutro Abs: 2.4 10*3/uL (ref 1.7–7.7)
Neutrophils Relative %: 92 %
Platelets: 148 10*3/uL — ABNORMAL LOW (ref 150–400)
RBC: 4.73 MIL/uL (ref 4.22–5.81)
RDW: 12.7 % (ref 11.5–15.5)
WBC: 2.6 10*3/uL — ABNORMAL LOW (ref 4.0–10.5)
nRBC: 0 % (ref 0.0–0.2)

## 2023-05-24 LAB — RESP PANEL BY RT-PCR (RSV, FLU A&B, COVID)  RVPGX2
Influenza A by PCR: NEGATIVE
Influenza B by PCR: NEGATIVE
Resp Syncytial Virus by PCR: NEGATIVE
SARS Coronavirus 2 by RT PCR: NEGATIVE

## 2023-05-24 LAB — TROPONIN I (HIGH SENSITIVITY)
Troponin I (High Sensitivity): 6 ng/L (ref <18)
Troponin I (High Sensitivity): 7 ng/L (ref <18)

## 2023-05-24 LAB — URINALYSIS, ROUTINE W REFLEX MICROSCOPIC
Bilirubin Urine: NEGATIVE
Glucose, UA: >=500 mg/dL — AB
Ketones, ur: 5 mg/dL — AB
Nitrite: NEGATIVE
Protein, ur: 30 mg/dL — AB
Specific Gravity, Urine: 1.017 (ref 1.005–1.030)
WBC, UA: >50 WBC/hpf (ref 0–5)
pH: 5 (ref 5.0–8.0)

## 2023-05-24 LAB — GLUCOSE, CAPILLARY: Glucose-Capillary: 256 mg/dL — ABNORMAL HIGH (ref 70–99)

## 2023-05-24 LAB — BASIC METABOLIC PANEL
Anion gap: 11 (ref 5–15)
BUN: 20 mg/dL (ref 8–23)
CO2: 22 mmol/L (ref 22–32)
Calcium: 9.1 mg/dL (ref 8.9–10.3)
Chloride: 105 mmol/L (ref 98–111)
Creatinine, Ser: 1.37 mg/dL — ABNORMAL HIGH (ref 0.61–1.24)
GFR, Estimated: 59 mL/min — ABNORMAL LOW (ref >60)
Glucose, Bld: 153 mg/dL — ABNORMAL HIGH (ref 70–99)
Potassium: 4.2 mmol/L (ref 3.5–5.1)
Sodium: 138 mmol/L (ref 135–145)

## 2023-05-24 LAB — HEPATIC FUNCTION PANEL
ALT: 16 U/L (ref 0–44)
AST: 23 U/L (ref 15–41)
Albumin: 3.3 g/dL — ABNORMAL LOW (ref 3.5–5.0)
Alkaline Phosphatase: 40 U/L (ref 38–126)
Bilirubin, Direct: 0.2 mg/dL (ref 0.0–0.2)
Indirect Bilirubin: 0.6 mg/dL (ref 0.3–0.9)
Total Bilirubin: 0.8 mg/dL (ref 0.3–1.2)
Total Protein: 6.2 g/dL — ABNORMAL LOW (ref 6.5–8.1)

## 2023-05-24 LAB — I-STAT CG4 LACTIC ACID, ED
Lactic Acid, Venous: 3.1 mmol/L (ref 0.5–1.9)
Lactic Acid, Venous: 3.5 mmol/L (ref 0.5–1.9)

## 2023-05-24 LAB — LACTIC ACID, PLASMA: Lactic Acid, Venous: 2.6 mmol/L (ref 0.5–1.9)

## 2023-05-24 LAB — APTT: aPTT: 28 s (ref 24–36)

## 2023-05-24 LAB — PROTIME-INR
INR: 1.1 (ref 0.8–1.2)
Prothrombin Time: 14.6 s (ref 11.4–15.2)

## 2023-05-24 MED ORDER — ACETAMINOPHEN 650 MG RE SUPP: 650.0000 mg | Freq: Four times a day (QID) | RECTAL | Status: DC | PRN

## 2023-05-24 MED ORDER — TAMSULOSIN HCL 0.4 MG PO CAPS: 0.4000 mg | ORAL_CAPSULE | Freq: Every day | ORAL | 0 refills | Status: AC

## 2023-05-24 MED ORDER — KETOROLAC TROMETHAMINE 15 MG/ML IJ SOLN
15.0000 mg | Freq: Once | INTRAMUSCULAR | Status: AC
  Administered 2023-05-24: 15 mg via INTRAVENOUS
  Filled 2023-05-24: qty 1

## 2023-05-24 MED ORDER — SODIUM CHLORIDE 0.9 % IV SOLN
2.0000 g | INTRAVENOUS | Status: DC
  Administered 2023-05-25: 2 g via INTRAVENOUS
  Filled 2023-05-24: qty 20

## 2023-05-24 MED ORDER — ACETAMINOPHEN 325 MG PO TABS
650.0000 mg | ORAL_TABLET | Freq: Four times a day (QID) | ORAL | Status: DC | PRN
  Administered 2023-05-24 – 2023-05-25 (×2): 650 mg via ORAL
  Filled 2023-05-24 (×2): qty 2

## 2023-05-24 MED ORDER — HYDROMORPHONE HCL 1 MG/ML IJ SOLN: 0.5000 mg | INTRAMUSCULAR | Status: DC | PRN

## 2023-05-24 MED ORDER — INSULIN ASPART 100 UNIT/ML IJ SOLN
0.0000 [IU] | Freq: Three times a day (TID) | INTRAMUSCULAR | Status: DC
Start: 2023-05-25 — End: 2023-05-26
  Administered 2023-05-25: 2 [IU] via SUBCUTANEOUS

## 2023-05-24 MED ORDER — SODIUM CHLORIDE 0.9 % IV SOLN
2.0000 g | Freq: Once | INTRAVENOUS | Status: AC
  Administered 2023-05-24: 2 g via INTRAVENOUS
  Filled 2023-05-24: qty 20

## 2023-05-24 MED ORDER — SENNOSIDES-DOCUSATE SODIUM 8.6-50 MG PO TABS: 1.0000 | ORAL_TABLET | Freq: Every evening | ORAL | Status: DC | PRN

## 2023-05-24 MED ORDER — SODIUM CHLORIDE 0.9 % IV BOLUS
1000.0000 mL | Freq: Once | INTRAVENOUS | Status: AC
  Administered 2023-05-24: 1000 mL via INTRAVENOUS

## 2023-05-24 MED ORDER — SULFAMETHOXAZOLE-TRIMETHOPRIM 800-160 MG PO TABS: 1.0000 | ORAL_TABLET | Freq: Once | ORAL | Status: DC

## 2023-05-24 MED ORDER — ASPIRIN 81 MG PO TBEC
81.0000 mg | DELAYED_RELEASE_TABLET | Freq: Every day | ORAL | Status: DC
  Administered 2023-05-25 – 2023-05-26 (×2): 81 mg via ORAL
  Filled 2023-05-24 (×2): qty 1

## 2023-05-24 MED ORDER — ENOXAPARIN SODIUM 40 MG/0.4ML IJ SOSY
40.0000 mg | PREFILLED_SYRINGE | INTRAMUSCULAR | Status: DC
  Administered 2023-05-24 – 2023-05-25 (×2): 40 mg via SUBCUTANEOUS
  Filled 2023-05-24 (×2): qty 0.4

## 2023-05-24 MED ORDER — ONDANSETRON HCL 4 MG PO TABS: 4.0000 mg | ORAL_TABLET | Freq: Four times a day (QID) | ORAL | Status: DC | PRN

## 2023-05-24 MED ORDER — ONDANSETRON HCL 4 MG/2ML IJ SOLN: 4.0000 mg | Freq: Four times a day (QID) | INTRAMUSCULAR | Status: DC | PRN

## 2023-05-24 MED ORDER — METHOCARBAMOL 1000 MG/10ML IJ SOLN
500.0000 mg | Freq: Once | INTRAMUSCULAR | Status: AC
  Administered 2023-05-24: 500 mg via INTRAMUSCULAR
  Filled 2023-05-24: qty 10

## 2023-05-24 MED ORDER — SULFAMETHOXAZOLE-TRIMETHOPRIM 800-160 MG PO TABS: 1.0000 | ORAL_TABLET | Freq: Two times a day (BID) | ORAL | 0 refills | Status: DC

## 2023-05-24 MED ORDER — OXYCODONE HCL 5 MG PO TABS: 5.0000 mg | ORAL_TABLET | ORAL | Status: DC | PRN

## 2023-05-24 MED ORDER — LACTATED RINGERS IV SOLN: INTRAVENOUS | Status: DC

## 2023-05-24 MED ORDER — METHOCARBAMOL 500 MG PO TABS: 500.0000 mg | ORAL_TABLET | Freq: Two times a day (BID) | ORAL | 0 refills | Status: AC

## 2023-05-24 MED ORDER — SODIUM CHLORIDE 0.9 % IV SOLN: 1.0000 g | Freq: Once | INTRAVENOUS | Status: DC

## 2023-05-24 MED ORDER — SODIUM CHLORIDE 0.9 % IV BOLUS: 500.0000 mL | Freq: Once | INTRAVENOUS | Status: DC

## 2023-05-24 MED ORDER — INSULIN ASPART 100 UNIT/ML IJ SOLN
0.0000 [IU] | Freq: Every day | INTRAMUSCULAR | Status: DC
Start: 2023-05-24 — End: 2023-05-26
  Administered 2023-05-24: 3 [IU] via SUBCUTANEOUS

## 2023-05-24 MED ORDER — DIPHENHYDRAMINE HCL 50 MG/ML IJ SOLN
12.5000 mg | Freq: Once | INTRAMUSCULAR | Status: AC
  Administered 2023-05-24: 12.5 mg via INTRAVENOUS
  Filled 2023-05-24: qty 1

## 2023-05-24 MED ORDER — ONDANSETRON HCL 4 MG/2ML IJ SOLN
INTRAMUSCULAR | Status: AC
  Filled 2023-05-24: qty 2

## 2023-05-24 MED ORDER — LACTATED RINGERS IV BOLUS
1000.0000 mL | Freq: Once | INTRAVENOUS | Status: AC
  Administered 2023-05-24: 1000 mL via INTRAVENOUS

## 2023-05-24 MED ORDER — PRAVASTATIN SODIUM 40 MG PO TABS
40.0000 mg | ORAL_TABLET | Freq: Every day | ORAL | Status: DC
  Administered 2023-05-25: 40 mg via ORAL
  Filled 2023-05-24 (×2): qty 1

## 2023-05-24 MED ORDER — METOCLOPRAMIDE HCL 5 MG/ML IJ SOLN
10.0000 mg | Freq: Once | INTRAMUSCULAR | Status: AC
  Administered 2023-05-24: 10 mg via INTRAVENOUS
  Filled 2023-05-24: qty 2

## 2023-05-24 MED ORDER — SODIUM CHLORIDE 0.9 % IV BOLUS: 1000.0000 mL | Freq: Once | INTRAVENOUS | Status: DC

## 2023-05-24 MED ORDER — SODIUM CHLORIDE 0.9% FLUSH
3.0000 mL | Freq: Two times a day (BID) | INTRAVENOUS | Status: DC
  Administered 2023-05-24 – 2023-05-26 (×4): 3 mL via INTRAVENOUS
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
## 2023-05-24 NOTE — Sepsis Progress Note (Signed)
Sepsis protocol monitored by eLink ?

## 2023-05-24 NOTE — ED Notes (Signed)
Went to CT

## 2023-05-24 NOTE — ED Notes (Signed)
Pt Temp 102.1, T. Opyd MD notified & IP RN Nash Dimmer updated

## 2023-05-24 NOTE — ED Notes (Signed)
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 chloride 0.9 % 100 mL IVPB (0 g Intravenous Stopped 05/24/23 1732)  lactated ringers bolus 1,000 mL (0 mLs Intravenous Stopped 05/24/23 1822)    Mobility non-ambulatory     Focused Assessments     R Recommendations: See Admitting Provider Note  Report given to:   Additional Notes:

## 2023-05-24 NOTE — ED Notes (Signed)
Critical Care paged per PA verbal order.
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
## 2023-05-25 DIAGNOSIS — E1165 Type 2 diabetes mellitus with hyperglycemia: Secondary | ICD-10-CM | POA: Diagnosis not present

## 2023-05-25 DIAGNOSIS — I2510 Atherosclerotic heart disease of native coronary artery without angina pectoris: Secondary | ICD-10-CM | POA: Diagnosis not present

## 2023-05-25 DIAGNOSIS — N200 Calculus of kidney: Secondary | ICD-10-CM

## 2023-05-25 DIAGNOSIS — N390 Urinary tract infection, site not specified: Secondary | ICD-10-CM | POA: Diagnosis not present

## 2023-05-25 DIAGNOSIS — R6520 Severe sepsis without septic shock: Secondary | ICD-10-CM

## 2023-05-25 DIAGNOSIS — D696 Thrombocytopenia, unspecified: Secondary | ICD-10-CM

## 2023-05-25 DIAGNOSIS — A419 Sepsis, unspecified organism: Secondary | ICD-10-CM | POA: Diagnosis not present

## 2023-05-25 LAB — BASIC METABOLIC PANEL
Anion gap: 8 (ref 5–15)
BUN: 20 mg/dL (ref 8–23)
CO2: 23 mmol/L (ref 22–32)
Calcium: 8.3 mg/dL — ABNORMAL LOW (ref 8.9–10.3)
Chloride: 106 mmol/L (ref 98–111)
Creatinine, Ser: 1.49 mg/dL — ABNORMAL HIGH (ref 0.61–1.24)
GFR, Estimated: 53 mL/min — ABNORMAL LOW (ref >60)
Glucose, Bld: 133 mg/dL — ABNORMAL HIGH (ref 70–99)
Potassium: 4.6 mmol/L (ref 3.5–5.1)
Sodium: 137 mmol/L (ref 135–145)

## 2023-05-25 LAB — CBC WITH DIFFERENTIAL/PLATELET
Abs Immature Granulocytes: 0.03 10*3/uL (ref 0.00–0.07)
Basophils Absolute: 0 10*3/uL (ref 0.0–0.1)
Basophils Relative: 0 %
Eosinophils Absolute: 0 10*3/uL (ref 0.0–0.5)
Eosinophils Relative: 0 %
HCT: 35.7 % — ABNORMAL LOW (ref 39.0–52.0)
Hemoglobin: 11.6 g/dL — ABNORMAL LOW (ref 13.0–17.0)
Immature Granulocytes: 0 %
Lymphocytes Relative: 5 %
Lymphs Abs: 0.4 10*3/uL — ABNORMAL LOW (ref 0.7–4.0)
MCH: 29.5 pg (ref 26.0–34.0)
MCHC: 32.5 g/dL (ref 30.0–36.0)
MCV: 90.8 fL (ref 80.0–100.0)
Monocytes Absolute: 0.5 10*3/uL (ref 0.1–1.0)
Monocytes Relative: 7 %
Neutro Abs: 6.6 10*3/uL (ref 1.7–7.7)
Neutrophils Relative %: 88 %
Platelets: 125 10*3/uL — ABNORMAL LOW (ref 150–400)
RBC: 3.93 MIL/uL — ABNORMAL LOW (ref 4.22–5.81)
RDW: 13 % (ref 11.5–15.5)
WBC: 7.6 10*3/uL (ref 4.0–10.5)
nRBC: 0 % (ref 0.0–0.2)

## 2023-05-25 LAB — HIV ANTIBODY (ROUTINE TESTING W REFLEX): HIV Screen 4th Generation wRfx: NONREACTIVE

## 2023-05-25 LAB — GLUCOSE, CAPILLARY
Glucose-Capillary: 137 mg/dL — ABNORMAL HIGH (ref 70–99)
Glucose-Capillary: 270 mg/dL — ABNORMAL HIGH (ref 70–99)
Glucose-Capillary: 225 mg/dL — ABNORMAL HIGH (ref 70–99)
Glucose-Capillary: 200 mg/dL — ABNORMAL HIGH (ref 70–99)

## 2023-05-25 LAB — LACTIC ACID, PLASMA: Lactic Acid, Venous: 1.3 mmol/L (ref 0.5–1.9)

## 2023-05-25 LAB — HEMOGLOBIN A1C
Hgb A1c MFr Bld: 9.1 % — ABNORMAL HIGH (ref 4.8–5.6)
Mean Plasma Glucose: 214.47 mg/dL

## 2023-05-25 MED ORDER — SODIUM CHLORIDE 0.9 % IV BOLUS: 500.0000 mL | Freq: Once | INTRAVENOUS | Status: DC

## 2023-05-25 NOTE — Progress Notes (Signed)
Mobility Specialist Progress Note:   05/25/23 1156  Mobility  Activity Ambulated independently in hallway  Level of Assistance Independent after set-up  Assistive Device None  Distance Ambulated (ft) 470 ft  Activity Response Tolerated well  Mobility Referral Yes  $Mobility charge 1 Mobility  Mobility Specialist Start Time (ACUTE ONLY) 1147  Mobility Specialist Stop Time (ACUTE ONLY) 1157  Mobility Specialist Time Calculation (min) (ACUTE ONLY) 10 min   Pt received in bed, agreeable to mobility. Pt independent not requiring any hands on assistance. Ambulated on RA. VSS. Asx throughout. Pt returned to bed with call bell in reach and all needs met.   [name]  Mobility Specialist Please contact via [employer] office at [phone]
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
## 2023-05-26 DIAGNOSIS — N390 Urinary tract infection, site not specified: Secondary | ICD-10-CM | POA: Diagnosis not present

## 2023-05-26 DIAGNOSIS — E1165 Type 2 diabetes mellitus with hyperglycemia: Secondary | ICD-10-CM | POA: Diagnosis not present

## 2023-05-26 DIAGNOSIS — I2510 Atherosclerotic heart disease of native coronary artery without angina pectoris: Secondary | ICD-10-CM | POA: Diagnosis not present

## 2023-05-26 DIAGNOSIS — A419 Sepsis, unspecified organism: Secondary | ICD-10-CM | POA: Diagnosis not present

## 2023-05-26 LAB — RENAL FUNCTION PANEL
Albumin: 2.9 g/dL — ABNORMAL LOW (ref 3.5–5.0)
Anion gap: 9 (ref 5–15)
BUN: 14 mg/dL (ref 8–23)
CO2: 23 mmol/L (ref 22–32)
Calcium: 8.6 mg/dL — ABNORMAL LOW (ref 8.9–10.3)
Chloride: 105 mmol/L (ref 98–111)
Creatinine, Ser: 1.17 mg/dL (ref 0.61–1.24)
GFR, Estimated: >60 mL/min (ref >60)
Glucose, Bld: 136 mg/dL — ABNORMAL HIGH (ref 70–99)
Phosphorus: 2.1 mg/dL — ABNORMAL LOW (ref 2.5–4.6)
Potassium: 4.2 mmol/L (ref 3.5–5.1)
Sodium: 137 mmol/L (ref 135–145)

## 2023-05-26 LAB — CBC
HCT: 35.2 % — ABNORMAL LOW (ref 39.0–52.0)
Hemoglobin: 11.6 g/dL — ABNORMAL LOW (ref 13.0–17.0)
MCH: 29.4 pg (ref 26.0–34.0)
MCHC: 33 g/dL (ref 30.0–36.0)
MCV: 89.1 fL (ref 80.0–100.0)
Platelets: 123 10*3/uL — ABNORMAL LOW (ref 150–400)
RBC: 3.95 MIL/uL — ABNORMAL LOW (ref 4.22–5.81)
RDW: 12.6 % (ref 11.5–15.5)
WBC: 5.4 10*3/uL (ref 4.0–10.5)
nRBC: 0 % (ref 0.0–0.2)

## 2023-05-26 LAB — GLUCOSE, CAPILLARY: Glucose-Capillary: 113 mg/dL — ABNORMAL HIGH (ref 70–99)

## 2023-05-26 LAB — MAGNESIUM: Magnesium: 1.7 mg/dL (ref 1.7–2.4)

## 2023-05-26 MED ORDER — DAPAGLIFLOZIN PROPANEDIOL 10 MG PO TABS: 10.0000 mg | ORAL_TABLET | Freq: Every day | ORAL | Status: AC

## 2023-05-26 MED ORDER — CEFADROXIL 500 MG PO CAPS: 1000.0000 mg | ORAL_CAPSULE | Freq: Two times a day (BID) | ORAL | 0 refills | Status: AC
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
## 2023-05-28 LAB — URINE CULTURE: Culture: >=100000 — AB

## 2023-05-29 LAB — CULTURE, BLOOD (ROUTINE X 2): Culture: NO GROWTH

## 2023-05-29 LAB — BLOOD CULTURE ID PANEL (REFLEXED) - BCID2

## 2023-05-31 LAB — CULTURE, BLOOD (ROUTINE X 2): Special Requests: ADEQUATE

## 2023-06-03 ENCOUNTER — Ambulatory Visit: Payer: BC Managed Care – PPO | Admitting: Interventional Cardiology

## 2023-12-25 LAB — LAB REPORT - SCANNED
A1c: 9.1
Albumin, Urine POC: 0.6
Creatinine, POC: 66 mg/dL
EGFR: 69
Microalb Creat Ratio: 9
PSA, Total: 0.46

## 2024-02-09 ENCOUNTER — Other Ambulatory Visit: Payer: Self-pay | Admitting: Internal Medicine

## 2024-02-09 ENCOUNTER — Ambulatory Visit
Admission: RE | Admit: 2024-02-09 | Discharge: 2024-02-09 | Disposition: A | Discharge status: Home / Self Care | Source: Ambulatory Visit | Attending: Internal Medicine | Admitting: Internal Medicine

## 2024-02-09 ENCOUNTER — Encounter: Payer: Self-pay | Admitting: Cardiology

## 2024-02-09 DIAGNOSIS — R0602 Shortness of breath: Secondary | ICD-10-CM

## 2024-02-09 NOTE — Telephone Encounter (Signed)
 Error

## 2024-02-10 ENCOUNTER — Other Ambulatory Visit: Payer: Self-pay | Admitting: Internal Medicine

## 2024-02-10 DIAGNOSIS — R0602 Shortness of breath: Secondary | ICD-10-CM

## 2024-02-10 DIAGNOSIS — R079 Chest pain, unspecified: Secondary | ICD-10-CM

## 2024-02-10 NOTE — H&P (View-Only) (Signed)
 Cardiology Office Note:    Date:  02/11/2024   ID:  Roy [name], DOB 23-Jan-1961, MRN [id number]  PCP:  Valma Carwin, MD   [facility] Providers Cardiologist:  Marylou Wages [place] MD Click to update primary MD,subspecialty MD or APP then REFRESH:1}    Referring MD: Valma Carwin, MD   Chief Complaint  Patient presents with   Follow-up    Complains of pain in his eyes and balance issues.   Edema   Headache   Shortness of Breath    History of Present Illness:    Roy [name] is a 63 y.o. male seen for evaluation of CAD and prior AV disease. He is a former patient of Dr Dann. He has a family h/o CAD. He had an acute MI in 6/13 with a DES placed in the LCx. In 12/13, he had knee surgery and 2 days later, he had another acute MI. The LCx was occluded and was restented with BMS x 2. On subsequent follow up he complained of refractory SOB and Echo showed normal EF with severe AI. He underwent repeat cardiac cath and TEE. 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 He underwent surgery with Dr Kerrin including AVR with #25 Magna Ease valve and CABG with LIMA to the LAD and sequential SVG to 2 OMs. Did not want to take coumadin  so bioprosthetic valve placed. He has done well from a cardiac standpoint since then. Myoview  study in Nov 2023 was normal. Echo in Dec 2023 showed normal LV function. Normal functioning AV prosthesis. He does have a history of HLD, DM type 2. Reports history of contrast allergy.  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 Over the past week he has noticed symptoms of SOB and chest pain. Pain is central and worse with deep breathing. He was seen by Dr Valma and D dimer was elevated to 0.84. planned CT chest tomorrow.   Past Medical History:  Diagnosis Date   Aortic insufficiency    a. 07/2012 s/p AVR: #25 Magnaease Bioprosthetic Valve (performed @ time of CABG).   Arthritis     CAD (coronary artery disease) cardiologist--- dr coni   a. 12/2011 NSTEMI/ PCI with DES to OM1;  b. 06/2012 NSTEMI/ PCI with BMS to mid LCX 100%->;  c. 07/2012 Cath: dissectied LM & LCX with patent LCX/OM stents->CABG x 3: LIMA->LAD, VG->OM1->OM2.;  nuclear study 09-12-2014 no ischemia and normal LVF   Diastolic CHF (HCC) 06/2012   Diverticulosis of colon    History of diverticulitis of colon    History of kidney stones    History of non-ST elevation myocardial infarction (NSTEMI) 2013   06/ 2013 s/p DES  and 12/ 2013  s/p BMS    History of skin cancer    04-13-2020  per pt has had multiiple areas burned off by dermatologist in office,  none have been biopsy'd   Lipoma of back    upper   Mixed hyperlipidemia    Moderate mitral regurgitation    with no stenosis per echo 03-05-2018   S/P aortic valve replacement 08/02/2012   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   LIMA to LAD,  SVG to OM1 ,  SVG to OM2   Type 2 diabetes mellitus (HCC)    followed by pcp  (04-13-2020  per pt checks blood sugar 3 times weekly,  fasting sugar-- 120--150)   Wears contact lenses  Past Surgical History:  Procedure Laterality Date   AORTIC VALVE REPLACEMENT  08/02/2012   Procedure: AORTIC VALVE REPLACEMENT (AVR);  Surgeon: Elspeth JAYSON Millers, MD;  Location: [facility] OR;  Service: Open Heart Surgery;  Laterality: N/A;   CARDIAC CATHETERIZATION  07/30/2012   dissection LM and CFx,  patent previous stents   CORONARY ANGIOGRAM  07/30/2012   Procedure: CORONARY ANGIOGRAM;  Surgeon: Lynwood Schilling, MD;  Location: [facility] CATH LAB;  Service: Cardiovascular;;   CORONARY ANGIOPLASTY WITH STENT PLACEMENT  12-22-2011  @[facility]    DES to OM1 (NSTEMI)   CORONARY ANGIOPLASTY WITH STENT PLACEMENT  07-01-2012  @[facility]    BMS to midCFx (NSTEMI)   CORONARY ARTERY BYPASS GRAFT  08/02/2012   Procedure: CORONARY ARTERY BYPASS GRAFTING (CABG);  Surgeon: Elspeth JAYSON Millers, MD;  Location: [facility] OR;  Service: Open Heart Surgery;  Laterality: N/A;    ENDOVEIN HARVEST OF GREATER SAPHENOUS VEIN  08/02/2012   Procedure: ENDOVEIN HARVEST OF GREATER SAPHENOUS VEIN;  Surgeon: Elspeth JAYSON Millers, MD;  Location: [facility] OR;  Service: Open Heart Surgery;  Laterality: Left;  upper and lower leg   EXTRACORPOREAL SHOCK WAVE LITHOTRIPSY  09/2010   INTRAOPERATIVE TRANSESOPHAGEAL ECHOCARDIOGRAM  08/02/2012   Procedure: INTRAOPERATIVE TRANSESOPHAGEAL ECHOCARDIOGRAM;  Surgeon: Elspeth JAYSON Millers, MD;  Location[facility] OR;  Service: Open Heart Surgery;  Laterality: N/A;   KERATOTOMIES Bilateral 1985   both eyes   KNEE ARTHROSCOPY  06/28/2012   Procedure: ARTHROSCOPY KNEE;  Surgeon: Marcey Raman, MD;  Location: [facility] OR;  Service: Orthopedics;  Laterality: Left;   KNEE ARTHROSCOPY Left x1  after 2013 (unsure date)   ROTATOR CUFF REPAIR Bilateral 2007  and 11/ 2017   SEPTOPLASTY  1990s   SHOULDER ARTHROSCOPY Left 12/2018   w/ decompression, debridement and manipulation   TEE WITHOUT CARDIOVERSION  07/28/2012   Procedure: TRANSESOPHAGEAL ECHOCARDIOGRAM (TEE);  Surgeon: Aleene JINNY Passe, MD;  Location: [facility] ENDOSCOPY;  Service: Cardiovascular;  Laterality: N/A;   TONSILLECTOMY  age 48   ULNAR TUNNEL RELEASE Right 1990s    Current Medications: Current Meds  Medication Sig   aspirin  EC 81 MG tablet Take 1 tablet (81 mg total) by mouth daily.   Bempedoic Acid-Ezetimibe (NEXLIZET ) 180-10 MG TABS Take 1 tablet by mouth daily.   dapagliflozin  propanediol (FARXIGA ) 10 MG TABS tablet Take 1 tablet (10 mg total) by mouth daily.   meloxicam (MOBIC) 15 MG tablet Take 15 mg by mouth as needed.   metFORMIN  (GLUCOPHAGE -XR) 500 MG 24 hr tablet Take 1,000 mg by mouth 2 (two) times daily with a meal.   ONETOUCH ULTRA test strip    pioglitazone  (ACTOS ) 45 MG tablet Take 45 mg by mouth daily.   pravastatin  (PRAVACHOL ) 40 MG tablet Take 40 mg by mouth daily.   predniSONE  (DELTASONE ) 50 MG tablet Patient to take 50 mg of prednisone  on 02/11/2024 at 1020pm, 50 mg of prednisone  on 02/12/2024  at 420am, and 50 mg of prednisone  on 02/12/2024 at 1020am. Pt is also to take 50 mg of benadryl  on 02/12/2024 at 1020am. Please call [phone] with any questions.   Semaglutide, 1 MG/DOSE, (OZEMPIC, 1 MG/DOSE,) 2 MG/1.5ML SOPN Inject into the skin.   sildenafil (VIAGRA) 50 MG tablet Take 50 mg by mouth daily as needed for erectile dysfunction.   Turmeric (QC TUMERIC COMPLEX PO) Take 500 mg by mouth daily.     Allergies:   Contrast media [iodinated contrast media], Topiramate, and Gadolinium derivatives   Social History   Socioeconomic History   Marital  status: Married    Spouse name: Research scientist (physical sciences)   Number of children: 3   Years of education: 16   Highest education level: Not on file  Occupational History   Occupation: [employer]  Tobacco Use   Smoking status: Never   Smokeless tobacco: Never  Vaping Use   Vaping status: Never Used  Substance and Sexual Activity   Alcohol use: No   Drug use: Never   Sexual activity: Not on file  Other Topics Concern   Not on file  Social History Narrative   Lives with wife   Caffeine use: 1 cup /day   Social Drivers of Corporate investment banker Strain: Not on file  Food Insecurity: Not on file  Transportation Needs: Not on file  Physical Activity: Not on file  Stress: Not on file  Social Connections: Not on file     Family History: The patient's family history includes Coronary artery disease in his mother; Diabetes in his paternal grandmother; Heart attack in his paternal grandfather; Heart disease in his father; Hyperlipidemia in his brother and another family member; Lung cancer in his mother; Melanoma in an other family member; Migraines in his father.  ROS:   Please see the history of present illness.     All other systems reviewed and are negative.  EKGs/Labs/Other Studies Reviewed:    The following studies were reviewed today:       Myoview  06/17/22:Study Highlights      The study is normal. The study is low risk.    No ST deviation was noted.   Left ventricular function is normal. End diastolic cavity size is normal. End systolic cavity size is normal.   Prior study available for comparison from 09/12/2014.   Diaphragmatic attenuation No ischemia/infarct Estimated EF 61%   Maude Emmer MD [facility]   Echo 07/17/22: IMPRESSIONS     1. Left ventricular ejection fraction, by estimation, is 60 to 65%. The  left ventricle has normal function. The left ventricle has no regional  wall motion abnormalities. There is mild concentric left ventricular  hypertrophy. Left ventricular diastolic  parameters are consistent with Grade I diastolic dysfunction (impaired  relaxation).   2. Right ventricular systolic function is normal. The right ventricular  size is normal. Tricuspid regurgitation signal is inadequate for assessing  PA pressure.   3. The mitral valve is normal in structure. Trivial mitral valve  regurgitation.   4. The aortic valve has been repaired/replaced. Aortic valve  regurgitation is not visualized. There is a 25 mm [employer] valve  present in the aortic position. Echo findings are consistent with normal  structure and function of the aortic valve  prosthesis. Aortic valve mean gradient measures 10.0 mmHg. Aortic valve  Vmax measures 2.30 m/s. EOA 2.1cm2, DI 0.6.   5. Aortic dilatation noted. There is borderline dilatation of the  ascending aorta, measuring 37 mm.   6. The inferior vena cava is normal in size with greater than 50%  respiratory variability, suggesting right atrial pressure of 3 mmHg.   Comparison(s): No significant change from prior TTE in 2019. Prior mean  AoV gradient .        Recent Labs: 05/24/2023: ALT 16 05/26/2023: BUN 14; Creatinine, Ser 1.17; Hemoglobin 11.6; Magnesium  1.7; Platelets 123; Potassium 4.2; Sodium 137  Recent Lipid Panel    Component Value Date/Time   CHOL 157 07/31/2012 0319   CHOL 105 07/01/2012 0019   TRIG 39 07/31/2012 0319  TRIG 47 07/01/2012 0019   HDL 54 07/31/2012 0319   HDL 35 (L) 07/01/2012 0019   CHOLHDL 2.9 07/31/2012 0319   VLDL 8 07/31/2012 0319   VLDL 9 07/01/2012 0019   LDLCALC 95 07/31/2012 0319   LDLCALC 61 07/01/2012 0019   Dated 04/04/21: cholesterol 13, triglycerides 100, HDL 43, LDL 75. LFTs normal.  Dated 12/29/22: glucose 162. Creatinine 1.16. cholesterol 202, triglycerides 65, HDL 45, LDL 145. A1c 9.7%. otherwise CMET and CBC normal.  Risk Assessment/Calculations:                Physical Exam:    VS:  BP 104/68 (BP Location: Left Arm, Patient Position: Sitting, Cuff Size: Normal)   Pulse 83   Ht 5' 9 (1.753 m)   Wt 195 lb (88.5 kg)   BMI 28.80 kg/m     Wt Readings from Last 3 Encounters:  02/11/24 195 lb (88.5 kg)  05/26/23 192 lb 14.4 oz (87.5 kg)  04/30/23 193 lb (87.5 kg)     GEN:  Well nourished, well developed in no acute distress HEENT: Normal NECK: No JVD; No carotid bruits LYMPHATICS: No lymphadenopathy CARDIAC: RRR, soft 1/6 systolic murmur, rubs, gallops RESPIRATORY:  Clear to auscultation without rales, wheezing or rhonchi  ABDOMEN: Soft, non-tender, non-distended MUSCULOSKELETAL:  No edema; No deformity  SKIN: Warm and dry NEUROLOGIC:  Alert and oriented x 3 PSYCHIATRIC:  Normal affect   ASSESSMENT:    1. Chest pain of uncertain etiology   2. Shortness of breath   3. S/P AVR (aortic valve replacement)   4. Coronary artery disease involving native coronary artery of native heart with other form of angina pectoris (HCC)   5. Primary hypertension     PLAN:    In order of problems listed above:  CAD s/p MI x 2 due to LCx occlusion. S/p emergent stenting. Second MI complicated by dissection of the left main into the aorta resulting in acute severe AI. S/p emergent CABG x 2 and AVR in 2014. Myoview  November 2023 was normal. Now with new symptoms of dyspnea and atypical chest pain. Heat stress may be playing a role. Getting CT tomorrow to rule out PE. If  negative will recommend stress nuclear study. Continue ASA. Focus on risk factor modification S/p bioprosthetic AVR. Good valve function on last Echo in Dec 2023. SBE prophylaxis.  DM Per PCP. Currently on metformin , Actos , Rybelsus and Farxiga .  HLD. Goal LDL < 55. Last was 145. On pravastatin . nexlizet . Request labs from Dr Valma  Follow up TBD          Medication Adjustments/Labs and Tests Ordered: Current medicines are reviewed at length with the patient today.  Concerns regarding medicines are outlined above.  No orders of the defined types were placed in this encounter.  No orders of the defined types were placed in this encounter.   There are no Patient Instructions on file for this visit.   Signed, Iram Lundberg [place], MD  02/11/2024 10:05 AM    [facility]

## 2024-02-10 NOTE — Progress Notes (Unsigned)
 Cardiology Office Note:    Date:  02/11/2024   ID:  [name], DOB 01/15/1961, MRN [id number]  PCP:  Valma Carwin, MD   [facility] Providers Cardiologist:  Zaire Levesque [place] MD Click to update primary MD,subspecialty MD or APP then REFRESH:1}    Referring MD: Valma Carwin, MD   Chief Complaint  Patient presents with   Follow-up    Complains of pain in his eyes and balance issues.   Edema   Headache   Shortness of Breath    History of Present Illness:    Roy [name] is a 63 y.o. male seen for evaluation of CAD and prior AV disease. He is a former patient of Dr Dann. He has a family h/o CAD. He had an acute MI in 6/13 with a DES placed in the LCx. In 12/13, he had knee surgery and 2 days later, he had another acute MI. The LCx was occluded and was restented with BMS x 2. On subsequent follow up he complained of refractory SOB and Echo showed normal EF with severe AI. He underwent repeat cardiac cath and TEE. 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 He underwent surgery with Dr Kerrin including AVR with #25 Magna Ease valve and CABG with LIMA to the LAD and sequential SVG to 2 OMs. Did not want to take coumadin  so bioprosthetic valve placed. He has done well from a cardiac standpoint since then. Myoview  study in Nov 2023 was normal. Echo in Dec 2023 showed normal LV function. Normal functioning AV prosthesis. He does have a history of HLD, DM type 2. Reports history of contrast allergy.  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 Over the past week he has noticed symptoms of SOB and chest pain. Pain is central and worse with deep breathing. He was seen by Dr Valma and D dimer was elevated to 0.84. planned CT chest tomorrow.   Past Medical History:  Diagnosis Date   Aortic insufficiency    a. 07/2012 s/p AVR: #25 Magnaease Bioprosthetic Valve (performed @ time of CABG).   Arthritis     CAD (coronary artery disease) cardiologist--- dr coni   a. 12/2011 NSTEMI/ PCI with DES to OM1;  b. 06/2012 NSTEMI/ PCI with BMS to mid LCX 100%->;  c. 07/2012 Cath: dissectied LM & LCX with patent LCX/OM stents->CABG x 3: LIMA->LAD, VG->OM1->OM2.;  nuclear study 09-12-2014 no ischemia and normal LVF   Diastolic CHF (HCC) 06/2012   Diverticulosis of colon    History of diverticulitis of colon    History of kidney stones    History of non-ST elevation myocardial infarction (NSTEMI) 2013   06/ 2013 s/p DES  and 12/ 2013  s/p BMS    History of skin cancer    04-13-2020  per pt has had multiiple areas burned off by dermatologist in office,  none have been biopsy'd   Lipoma of back    upper   Mixed hyperlipidemia    Moderate mitral regurgitation    with no stenosis per echo 03-05-2018   S/P aortic valve replacement 08/02/2012   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   LIMA to LAD,  SVG to OM1 ,  SVG to OM2   Type 2 diabetes mellitus (HCC)    followed by pcp  (04-13-2020  per pt checks blood sugar 3 times weekly,  fasting sugar-- 120--150)   Wears contact lenses  Past Surgical History:  Procedure Laterality Date   AORTIC VALVE REPLACEMENT  08/02/2012   Procedure: AORTIC VALVE REPLACEMENT (AVR);  Surgeon: Elspeth JAYSON Millers, MD;  Location: [facility] OR;  Service: Open Heart Surgery;  Laterality: N/A;   CARDIAC CATHETERIZATION  07/30/2012   dissection LM and CFx,  patent previous stents   CORONARY ANGIOGRAM  07/30/2012   Procedure: CORONARY ANGIOGRAM;  Surgeon: Lynwood Schilling, MD;  Location: [facility] CATH LAB;  Service: Cardiovascular;;   CORONARY ANGIOPLASTY WITH STENT PLACEMENT  12-22-2011  @[facility]    DES to OM1 (NSTEMI)   CORONARY ANGIOPLASTY WITH STENT PLACEMENT  07-01-2012  @[facility]    BMS to midCFx (NSTEMI)   CORONARY ARTERY BYPASS GRAFT  08/02/2012   Procedure: CORONARY ARTERY BYPASS GRAFTING (CABG);  Surgeon: Elspeth JAYSON Millers, MD;  Location[facility] OR;  Service: Open Heart Surgery;  Laterality: N/A;    ENDOVEIN HARVEST OF GREATER SAPHENOUS VEIN  08/02/2012   Procedure: ENDOVEIN HARVEST OF GREATER SAPHENOUS VEIN;  Surgeon: Elspeth JAYSON Millers, MD;  Location: [facility] OR;  Service: Open Heart Surgery;  Laterality: Left;  upper and lower leg   EXTRACORPOREAL SHOCK WAVE LITHOTRIPSY  09/2010   INTRAOPERATIVE TRANSESOPHAGEAL ECHOCARDIOGRAM  08/02/2012   Procedure: INTRAOPERATIVE TRANSESOPHAGEAL ECHOCARDIOGRAM;  Surgeon: Elspeth JAYSON Millers, MD;  Location: [facility] OR;  Service: Open Heart Surgery;  Laterality: N/A;   KERATOTOMIES Bilateral 1985   both eyes   KNEE ARTHROSCOPY  06/28/2012   Procedure: ARTHROSCOPY KNEE;  Surgeon: Marcey Raman, MD;  Location: [facility] OR;  Service: Orthopedics;  Laterality: Left;   KNEE ARTHROSCOPY Left x1  after 2013 (unsure date)   ROTATOR CUFF REPAIR Bilateral 2007  and 11/ 2017   SEPTOPLASTY  1990s   SHOULDER ARTHROSCOPY Left 12/2018   w/ decompression, debridement and manipulation   TEE WITHOUT CARDIOVERSION  07/28/2012   Procedure: TRANSESOPHAGEAL ECHOCARDIOGRAM (TEE);  Surgeon: Aleene JINNY Passe, MD;  Location: [facility] ENDOSCOPY;  Service: Cardiovascular;  Laterality: N/A;   TONSILLECTOMY  age 43   ULNAR TUNNEL RELEASE Right 1990s    Current Medications: Current Meds  Medication Sig   aspirin  EC 81 MG tablet Take 1 tablet (81 mg total) by mouth daily.   Bempedoic Acid-Ezetimibe (NEXLIZET ) 180-10 MG TABS Take 1 tablet by mouth daily.   dapagliflozin  propanediol (FARXIGA ) 10 MG TABS tablet Take 1 tablet (10 mg total) by mouth daily.   meloxicam (MOBIC) 15 MG tablet Take 15 mg by mouth as needed.   metFORMIN  (GLUCOPHAGE -XR) 500 MG 24 hr tablet Take 1,000 mg by mouth 2 (two) times daily with a meal.   ONETOUCH ULTRA test strip    pioglitazone  (ACTOS ) 45 MG tablet Take 45 mg by mouth daily.   pravastatin  (PRAVACHOL ) 40 MG tablet Take 40 mg by mouth daily.   predniSONE  (DELTASONE ) 50 MG tablet Patient to take 50 mg of prednisone  on 02/11/2024 at 1020pm, 50 mg of prednisone  on 02/12/2024  at 420am, and 50 mg of prednisone  on 02/12/2024 at 1020am. Pt is also to take 50 mg of benadryl  on 02/12/2024 at 1020am. Please call [phone] with any questions.   Semaglutide, 1 MG/DOSE, (OZEMPIC, 1 MG/DOSE,) 2 MG/1.5ML SOPN Inject into the skin.   sildenafil (VIAGRA) 50 MG tablet Take 50 mg by mouth daily as needed for erectile dysfunction.   Turmeric (QC TUMERIC COMPLEX PO) Take 500 mg by mouth daily.     Allergies:   Contrast media [iodinated contrast media], Topiramate, and Gadolinium derivatives   Social History   Socioeconomic History   Marital  status: Married    Spouse name: Research scientist (physical sciences)   Number of children: 3   Years of education: 16   Highest education level: Not on file  Occupational History   Occupation: [employer]  Tobacco Use   Smoking status: Never   Smokeless tobacco: Never  Vaping Use   Vaping status: Never Used  Substance and Sexual Activity   Alcohol use: No   Drug use: Never   Sexual activity: Not on file  Other Topics Concern   Not on file  Social History Narrative   Lives with wife   Caffeine use: 1 cup /day   Social Drivers of Corporate investment banker Strain: Not on file  Food Insecurity: Not on file  Transportation Needs: Not on file  Physical Activity: Not on file  Stress: Not on file  Social Connections: Not on file     Family History: The patient's family history includes Coronary artery disease in his mother; Diabetes in his paternal grandmother; Heart attack in his paternal grandfather; Heart disease in his father; Hyperlipidemia in his brother and another family member; Lung cancer in his mother; Melanoma in an other family member; Migraines in his father.  ROS:   Please see the history of present illness.     All other systems reviewed and are negative.  EKGs/Labs/Other Studies Reviewed:    The following studies were reviewed today:       Myoview  06/17/22:Study Highlights      The study is normal. The study is low risk.    No ST deviation was noted.   Left ventricular function is normal. End diastolic cavity size is normal. End systolic cavity size is normal.   Prior study available for comparison from 09/12/2014.   Diaphragmatic attenuation No ischemia/infarct Estimated EF 61%   Maude Emmer MD [facility]   Echo 07/17/22: IMPRESSIONS     1. Left ventricular ejection fraction, by estimation, is 60 to 65%. The  left ventricle has normal function. The left ventricle has no regional  wall motion abnormalities. There is mild concentric left ventricular  hypertrophy. Left ventricular diastolic  parameters are consistent with Grade I diastolic dysfunction (impaired  relaxation).   2. Right ventricular systolic function is normal. The right ventricular  size is normal. Tricuspid regurgitation signal is inadequate for assessing  PA pressure.   3. The mitral valve is normal in structure. Trivial mitral valve  regurgitation.   4. The aortic valve has been repaired/replaced. Aortic valve  regurgitation is not visualized. There is a 25 mm [employer] valve  present in the aortic position. Echo findings are consistent with normal  structure and function of the aortic valve  prosthesis. Aortic valve mean gradient measures 10.0 mmHg. Aortic valve  Vmax measures 2.30 m/s. EOA 2.1cm2, DI 0.6.   5. Aortic dilatation noted. There is borderline dilatation of the  ascending aorta, measuring 37 mm.   6. The inferior vena cava is normal in size with greater than 50%  respiratory variability, suggesting right atrial pressure of 3 mmHg.   Comparison(s): No significant change from prior TTE in 2019. Prior mean  AoV gradient .        Recent Labs: 05/24/2023: ALT 16 05/26/2023: BUN 14; Creatinine, Ser 1.17; Hemoglobin 11.6; Magnesium  1.7; Platelets 123; Potassium 4.2; Sodium 137  Recent Lipid Panel    Component Value Date/Time   CHOL 157 07/31/2012 0319   CHOL 105 07/01/2012 0019   TRIG 39 07/31/2012 0319  TRIG 47 07/01/2012 0019   HDL 54 07/31/2012 0319   HDL 35 (L) 07/01/2012 0019   CHOLHDL 2.9 07/31/2012 0319   VLDL 8 07/31/2012 0319   VLDL 9 07/01/2012 0019   LDLCALC 95 07/31/2012 0319   LDLCALC 61 07/01/2012 0019   Dated 04/04/21: cholesterol 13, triglycerides 100, HDL 43, LDL 75. LFTs normal.  Dated 12/29/22: glucose 162. Creatinine 1.16. cholesterol 202, triglycerides 65, HDL 45, LDL 145. A1c 9.7%. otherwise CMET and CBC normal.  Risk Assessment/Calculations:                Physical Exam:    VS:  BP 104/68 (BP Location: Left Arm, Patient Position: Sitting, Cuff Size: Normal)   Pulse 83   Ht 5' 9 (1.753 m)   Wt 195 lb (88.5 kg)   BMI 28.80 kg/m     Wt Readings from Last 3 Encounters:  02/11/24 195 lb (88.5 kg)  05/26/23 192 lb 14.4 oz (87.5 kg)  04/30/23 193 lb (87.5 kg)     GEN:  Well nourished, well developed in no acute distress HEENT: Normal NECK: No JVD; No carotid bruits LYMPHATICS: No lymphadenopathy CARDIAC: RRR, soft 1/6 systolic murmur, rubs, gallops RESPIRATORY:  Clear to auscultation without rales, wheezing or rhonchi  ABDOMEN: Soft, non-tender, non-distended MUSCULOSKELETAL:  No edema; No deformity  SKIN: Warm and dry NEUROLOGIC:  Alert and oriented x 3 PSYCHIATRIC:  Normal affect   ASSESSMENT:    1. Chest pain of uncertain etiology   2. Shortness of breath   3. S/P AVR (aortic valve replacement)   4. Coronary artery disease involving native coronary artery of native heart with other form of angina pectoris (HCC)   5. Primary hypertension     PLAN:    In order of problems listed above:  CAD s/p MI x 2 due to LCx occlusion. S/p emergent stenting. Second MI complicated by dissection of the left main into the aorta resulting in acute severe AI. S/p emergent CABG x 2 and AVR in 2014. Myoview  November 2023 was normal. Now with new symptoms of dyspnea and atypical chest pain. Heat stress may be playing a role. Getting CT tomorrow to rule out PE. If  negative will recommend stress nuclear study. Continue ASA. Focus on risk factor modification S/p bioprosthetic AVR. Good valve function on last Echo in Dec 2023. SBE prophylaxis.  DM Per PCP. Currently on metformin , Actos , Rybelsus and Farxiga .  HLD. Goal LDL < 55. Last was 145. On pravastatin . nexlizet . Request labs from Dr Valma  Follow up TBD          Medication Adjustments/Labs and Tests Ordered: Current medicines are reviewed at length with the patient today.  Concerns regarding medicines are outlined above.  No orders of the defined types were placed in this encounter.  No orders of the defined types were placed in this encounter.   There are no Patient Instructions on file for this visit.   Signed, Aarian Cleaver [place], MD  02/11/2024 10:05 AM    [facility]

## 2024-02-11 ENCOUNTER — Encounter: Payer: Self-pay | Admitting: Internal Medicine

## 2024-02-11 ENCOUNTER — Encounter: Payer: Self-pay | Admitting: Cardiology

## 2024-02-11 ENCOUNTER — Telehealth: Payer: Self-pay

## 2024-02-11 ENCOUNTER — Ambulatory Visit: Attending: Cardiology | Admitting: Cardiology

## 2024-02-11 VITALS — BP 104/68 | HR 83 | Ht 69.0 in | Wt 195.0 lb

## 2024-02-11 DIAGNOSIS — Z952 Presence of prosthetic heart valve: Secondary | ICD-10-CM

## 2024-02-11 DIAGNOSIS — I10 Essential (primary) hypertension: Secondary | ICD-10-CM

## 2024-02-11 DIAGNOSIS — I25118 Atherosclerotic heart disease of native coronary artery with other forms of angina pectoris: Secondary | ICD-10-CM | POA: Diagnosis not present

## 2024-02-11 DIAGNOSIS — R0602 Shortness of breath: Secondary | ICD-10-CM

## 2024-02-11 DIAGNOSIS — R079 Chest pain, unspecified: Secondary | ICD-10-CM

## 2024-02-11 MED ORDER — PREDNISONE 50 MG PO TABS
ORAL_TABLET | ORAL | 0 refills | Status: DC
Start: 2024-02-11 — End: 2024-03-02

## 2024-02-11 NOTE — Telephone Encounter (Signed)
 Phone call to patient to review instructions for 13-hr prep for CT scan w/ contrast on 02/12/2024 at 1120am. Prescription called into [facility]. Patient is aware and verbalized understanding of instructions.    Patient to take 50 mg of prednisone  on 02/11/2024 at 1020pm, 50 mg of prednisone  on 02/12/2024 at 420am, and 50 mg of prednisone  on 02/12/2024 at 1020am. Pt is also to take 50 mg of benadryl  on 02/12/2024 at 1020am. Please call [phone] with any questions.

## 2024-02-11 NOTE — Patient Instructions (Signed)
 Medication Instructions:  Continue same medications *If you need a refill on your cardiac medications before your next appointment, please call your pharmacy*  Lab Work: None ordered  Testing/Procedures: None ordered  Follow-Up: At [facility], you and your health needs are our priority.  As part of our continuing mission to provide you with exceptional heart care, our providers are all part of one team.  This team includes your primary Cardiologist (physician) and Advanced Practice Providers or APPs (Physician Assistants and Nurse Practitioners) who all work together to provide you with the care you need, when you need it.  Your next appointment:  To Be Determined    Provider:  Dr.Jordan   We recommend signing up for the patient portal called MyChart.  Sign up information is provided on this After Visit Summary.  MyChart is used to connect with patients for Virtual Visits (Telemedicine).  Patients are able to view lab/test results, encounter notes, upcoming appointments, etc.  Non-urgent messages can be sent to your provider as well.   To learn more about what you can do with MyChart, go to [email or web address].

## 2024-02-12 ENCOUNTER — Ambulatory Visit
Admission: RE | Admit: 2024-02-12 | Discharge: 2024-02-12 | Disposition: A | Discharge status: Home / Self Care | Source: Ambulatory Visit | Attending: Internal Medicine | Admitting: Internal Medicine

## 2024-02-12 ENCOUNTER — Other Ambulatory Visit: Payer: Self-pay | Admitting: Cardiology

## 2024-02-12 ENCOUNTER — Encounter: Payer: Self-pay | Admitting: Radiology

## 2024-02-12 ENCOUNTER — Telehealth: Payer: Self-pay

## 2024-02-12 DIAGNOSIS — R079 Chest pain, unspecified: Secondary | ICD-10-CM

## 2024-02-12 DIAGNOSIS — I25118 Atherosclerotic heart disease of native coronary artery with other forms of angina pectoris: Secondary | ICD-10-CM

## 2024-02-12 DIAGNOSIS — R0602 Shortness of breath: Secondary | ICD-10-CM

## 2024-02-12 MED ORDER — IOPAMIDOL (ISOVUE-370) INJECTION 76%
75.0000 mL | Freq: Once | INTRAVENOUS | Status: AC | PRN
  Administered 2024-02-12: 75 mL via INTRAVENOUS

## 2024-02-12 NOTE — Telephone Encounter (Signed)
 Spoke to patient Dr.Jordan advised needs Stress Myoview .Scheduler will call back with appointment.

## 2024-02-17 ENCOUNTER — Telehealth (HOSPITAL_COMMUNITY): Payer: Self-pay | Admitting: *Deleted

## 2024-02-17 NOTE — Telephone Encounter (Signed)
 Patient given detailed instructions per Myocardial Perfusion Study Information Sheet for the test on 02/22/24 at 12:45. Patient notified to arrive 15 minutes early and that it is imperative to arrive on time for appointment to keep from having the test rescheduled.  If you need to cancel or reschedule your appointment, please call the office within 24 hours of your appointment. . Patient verbalized understanding.Roy [name]

## 2024-02-19 ENCOUNTER — Other Ambulatory Visit: Payer: Self-pay | Admitting: Cardiology

## 2024-02-19 DIAGNOSIS — R079 Chest pain, unspecified: Secondary | ICD-10-CM

## 2024-02-22 ENCOUNTER — Ambulatory Visit (HOSPITAL_COMMUNITY)
Admission: RE | Admit: 2024-02-22 | Discharge: 2024-02-22 | Disposition: A | Discharge status: Home / Self Care | Source: Ambulatory Visit | Attending: Cardiology | Admitting: Cardiology

## 2024-02-22 DIAGNOSIS — R079 Chest pain, unspecified: Secondary | ICD-10-CM | POA: Insufficient documentation

## 2024-02-22 LAB — MYOCARDIAL PERFUSION IMAGING
LV dias vol: 83 mL (ref 62–150)
LV sys vol: 24 mL (ref 4.2–5.8)
Nuc Stress EF: 71 %
Peak HR: 100 {beats}/min
Rest HR: 70 {beats}/min
Rest Nuclear Isotope Dose: 10.1 mCi
SDS: 0
SRS: 0
SSS: 0
ST Depression (mm): 0 mm
Stress Nuclear Isotope Dose: 31.5 mCi
TID: 0.86

## 2024-02-22 MED ORDER — TECHNETIUM TC 99M TETROFOSMIN IV KIT
31.5000 | PACK | Freq: Once | INTRAVENOUS | Status: AC | PRN
  Administered 2024-02-22: 31.5 via INTRAVENOUS

## 2024-02-22 MED ORDER — REGADENOSON 0.4 MG/5ML IV SOLN
INTRAVENOUS | Status: AC
  Filled 2024-02-22: qty 5

## 2024-02-22 MED ORDER — TECHNETIUM TC 99M TETROFOSMIN IV KIT
10.1000 | PACK | Freq: Once | INTRAVENOUS | Status: AC | PRN
  Administered 2024-02-22: 10.1 via INTRAVENOUS

## 2024-02-22 MED ORDER — REGADENOSON 0.4 MG/5ML IV SOLN
0.4000 mg | Freq: Once | INTRAVENOUS | Status: AC
  Administered 2024-02-22: 0.4 mg via INTRAVENOUS

## 2024-02-23 ENCOUNTER — Ambulatory Visit: Payer: Self-pay | Admitting: Cardiology

## 2024-02-23 ENCOUNTER — Other Ambulatory Visit (HOSPITAL_COMMUNITY): Payer: Self-pay

## 2024-02-23 ENCOUNTER — Telehealth: Payer: Self-pay

## 2024-02-23 DIAGNOSIS — Z01812 Encounter for preprocedural laboratory examination: Secondary | ICD-10-CM

## 2024-02-23 MED ORDER — DIPHENHYDRAMINE HCL 25 MG PO TABS
50.0000 mg | ORAL_TABLET | Freq: Once | ORAL | 0 refills | Status: AC
  Filled 2024-02-23: qty 2, 1d supply, fill #0

## 2024-02-23 MED ORDER — PREDNISONE 50 MG PO TABS
50.0000 mg | ORAL_TABLET | Freq: Three times a day (TID) | ORAL | 0 refills | Status: DC
  Filled 2024-02-23: qty 3, 1d supply, fill #0

## 2024-02-23 NOTE — Telephone Encounter (Signed)
  [facility] A DEPT OF [facility] [address] A DEPT OF THE . [facility] [address] [place] [address] Dept: [phone] Loc: [phone]  Roy [name]  02/23/2024  You are scheduled for a Cardiac Catheterization on Wednesday, August 13 with Dr. Peter [place].  1. Please arrive at the [facility] (Main Entrance A) at [facility]: [address] [place], [place] [address] at 6:30 AM (This time is 2 hour(s) before your procedure to ensure your preparation).   Free valet parking service is available. You will check in at ADMITTING. The support person will be asked to wait in the waiting room.  It is OK to have someone drop you off and come back when you are ready to be discharged.    Special note: Every effort is made to have your procedure done on time. Please understand that emergencies sometimes delay scheduled procedures.  2. Diet: Nothing to eat after midnight   3. Hydration: You need to be well hydrated before your procedure time. You may drink approved liquids (see below) until you arrive at the hospital. On the way to the hospital, please drink a 16-oz (1 plastic bottle) of water.   List of approved liquids water, clear juice, clear tea, black coffee, fruit juices, non-citric and without pulp, carbonated beverages, Gatorade, Kool -Aid, plain Jello-O and plain ice popsicles.   4. Labs: You will need to have blood drawn on  Wed 8/6 at Dr.Jordan's office [facility].You do not need to be fasting.  5. Medication instructions in preparation for your procedure:   Contrast Allergy: Yes, Please take Prednisone  50mg  by mouth at: Thirteen hours prior to cath   Seven hours prior to cath   And prior to leaving home please take last dose of Prednisone  50mg  and Benadryl  50mg  by mouth.   Hold Ozempic 7 days before cath  Hold Farxiga  3 days before cath  Hold Metformin  day of cath and Hold 2 days after cath    On the  morning of your procedure, take your Aspirin  81 mg and any morning medicines NOT listed above.  You may use sips of water.  6. Plan to go home the same day, you will only stay overnight if medically necessary. 7. Bring a current list of your medications and current insurance cards. 8. You MUST have a responsible person to drive you home. 9. Someone MUST be with you the first 24 hours after you arrive home or your discharge will be delayed. 10. Please wear clothes that are easy to get on and off and wear slip-on shoes.  Thank you for allowing us  to care for you!   -- [facility]

## 2024-02-24 ENCOUNTER — Other Ambulatory Visit: Payer: Self-pay

## 2024-02-24 ENCOUNTER — Other Ambulatory Visit (HOSPITAL_COMMUNITY): Payer: Self-pay

## 2024-02-24 ENCOUNTER — Other Ambulatory Visit: Payer: Self-pay | Admitting: Cardiology

## 2024-02-24 DIAGNOSIS — R072 Precordial pain: Secondary | ICD-10-CM

## 2024-02-25 ENCOUNTER — Ambulatory Visit: Payer: Self-pay | Admitting: Cardiology

## 2024-02-25 LAB — BASIC METABOLIC PANEL WITH GFR
BUN/Creatinine Ratio: 12 (ref 10–24)
BUN: 17 mg/dL (ref 8–27)
CO2: 21 mmol/L (ref 20–29)
Calcium: 9.3 mg/dL (ref 8.6–10.2)
Chloride: 101 mmol/L (ref 96–106)
Creatinine, Ser: 1.4 mg/dL — ABNORMAL HIGH (ref 0.76–1.27)
Glucose: 193 mg/dL — ABNORMAL HIGH (ref 70–99)
Potassium: 4.8 mmol/L (ref 3.5–5.2)
Sodium: 138 mmol/L (ref 134–144)
eGFR: 57 mL/min/1.73 — ABNORMAL LOW (ref >59)

## 2024-02-25 LAB — CBC WITH DIFFERENTIAL/PLATELET
Basophils Absolute: 0 x10E3/uL (ref 0.0–0.2)
Basos: 0 %
EOS (ABSOLUTE): 0.2 x10E3/uL (ref 0.0–0.4)
Eos: 3 %
Hematocrit: 41.1 % (ref 37.5–51.0)
Hemoglobin: 13.4 g/dL (ref 13.0–17.7)
Immature Grans (Abs): 0 x10E3/uL (ref 0.0–0.1)
Immature Granulocytes: 0 %
Lymphocytes Absolute: 1.5 x10E3/uL (ref 0.7–3.1)
Lymphs: 25 %
MCH: 30 pg (ref 26.6–33.0)
MCHC: 32.6 g/dL (ref 31.5–35.7)
MCV: 92 fL (ref 79–97)
Monocytes Absolute: 0.5 x10E3/uL (ref 0.1–0.9)
Monocytes: 9 %
Neutrophils Absolute: 3.7 x10E3/uL (ref 1.4–7.0)
Neutrophils: 62 %
Platelets: 158 x10E3/uL (ref 150–450)
RBC: 4.47 x10E6/uL (ref 4.14–5.80)
RDW: 12.8 % (ref 11.6–15.4)
WBC: 6 x10E3/uL (ref 3.4–10.8)

## 2024-02-29 ENCOUNTER — Telehealth: Payer: Self-pay | Admitting: *Deleted

## 2024-02-29 ENCOUNTER — Telehealth: Payer: Self-pay | Admitting: Cardiology

## 2024-02-29 NOTE — Telephone Encounter (Signed)
 Cardiac Catheterization scheduled at [facility] for: Wednesday March 02, 2024 8:30 AM Arrival time [facility] Main Entrance A at: 6:30 AM  Diet: -Nothing to eat after midnight prior to procedure.  Hydration: -May drink clear liquids until leaving for hospital. Approved liquids: Water , clear tea, black coffee, fruit juices-non-citric and without pulp,Gatorade, plain Jello/popsicles.  Drink 16 oz. bottle of water  on the way to the hospital.   CONTRAST ALLERGY: 13 hour Prednisone  and Benadryl  Prep: 03/01/24 Prednisone  50 mg at 7:30 PM 03/02/24 Prednisone  50 mg at 1 :30 AM 03/02/24 Prednisone  50 mg and Benadryl  50 mg just prior to leaving home for hospital Do not drive after taking Benadryl   Medication instructions: -Hold:   Metformin -day of procedure and 48 hours post procedure  Actos /Farxiga -AM of procedure  Ozempic-weeekly on Tuesdays-will not take 03/01/24 -Other usual morning medications can be taken including aspirin  81 mg.  Plan to go home the same day, you will only stay overnight if medically necessary.  You must have responsible adult to drive you home.  Someone must be with you the first 24 hours after you arrive home.  Reviewed procedure instructions with patient.

## 2024-02-29 NOTE — Telephone Encounter (Signed)
 Paper Work Dropped Off:  Disability Forms  Date: 02/29/24  Location of paper:  Provider Mailbox

## 2024-03-01 DIAGNOSIS — Z0279 Encounter for issue of other medical certificate: Secondary | ICD-10-CM

## 2024-03-02 ENCOUNTER — Encounter (HOSPITAL_COMMUNITY)
Admission: RE | Disposition: A | Discharge status: Home / Self Care | Payer: Self-pay | Source: Home / Self Care | Attending: Cardiology

## 2024-03-02 ENCOUNTER — Ambulatory Visit (HOSPITAL_COMMUNITY)
Admission: RE | Admit: 2024-03-02 | Discharge: 2024-03-02 | Disposition: A | Discharge status: Home / Self Care | Attending: Cardiology | Admitting: Cardiology

## 2024-03-02 ENCOUNTER — Other Ambulatory Visit: Payer: Self-pay

## 2024-03-02 ENCOUNTER — Encounter (HOSPITAL_COMMUNITY): Payer: Self-pay | Admitting: Cardiology

## 2024-03-02 DIAGNOSIS — Z79899 Other long term (current) drug therapy: Secondary | ICD-10-CM | POA: Insufficient documentation

## 2024-03-02 DIAGNOSIS — Z953 Presence of xenogenic heart valve: Secondary | ICD-10-CM | POA: Diagnosis not present

## 2024-03-02 DIAGNOSIS — I5032 Chronic diastolic (congestive) heart failure: Secondary | ICD-10-CM | POA: Diagnosis not present

## 2024-03-02 DIAGNOSIS — E119 Type 2 diabetes mellitus without complications: Secondary | ICD-10-CM | POA: Diagnosis not present

## 2024-03-02 DIAGNOSIS — Z91041 Radiographic dye allergy status: Secondary | ICD-10-CM | POA: Insufficient documentation

## 2024-03-02 DIAGNOSIS — Z951 Presence of aortocoronary bypass graft: Secondary | ICD-10-CM | POA: Diagnosis not present

## 2024-03-02 DIAGNOSIS — Z8249 Family history of ischemic heart disease and other diseases of the circulatory system: Secondary | ICD-10-CM | POA: Diagnosis not present

## 2024-03-02 DIAGNOSIS — E785 Hyperlipidemia, unspecified: Secondary | ICD-10-CM | POA: Insufficient documentation

## 2024-03-02 DIAGNOSIS — I110 Hypertensive heart disease with heart failure: Secondary | ICD-10-CM | POA: Diagnosis not present

## 2024-03-02 DIAGNOSIS — Z7982 Long term (current) use of aspirin: Secondary | ICD-10-CM | POA: Diagnosis not present

## 2024-03-02 DIAGNOSIS — Z955 Presence of coronary angioplasty implant and graft: Secondary | ICD-10-CM | POA: Diagnosis not present

## 2024-03-02 DIAGNOSIS — Z7985 Long-term (current) use of injectable non-insulin antidiabetic drugs: Secondary | ICD-10-CM | POA: Insufficient documentation

## 2024-03-02 DIAGNOSIS — R079 Chest pain, unspecified: Secondary | ICD-10-CM | POA: Diagnosis present

## 2024-03-02 DIAGNOSIS — Z7984 Long term (current) use of oral hypoglycemic drugs: Secondary | ICD-10-CM | POA: Insufficient documentation

## 2024-03-02 DIAGNOSIS — I2510 Atherosclerotic heart disease of native coronary artery without angina pectoris: Secondary | ICD-10-CM

## 2024-03-02 DIAGNOSIS — R072 Precordial pain: Secondary | ICD-10-CM

## 2024-03-02 DIAGNOSIS — I252 Old myocardial infarction: Secondary | ICD-10-CM | POA: Diagnosis not present

## 2024-03-02 DIAGNOSIS — I2582 Chronic total occlusion of coronary artery: Secondary | ICD-10-CM | POA: Diagnosis not present

## 2024-03-02 HISTORY — PX: LEFT HEART CATH AND CORS/GRAFTS ANGIOGRAPHY: CATH118250

## 2024-03-02 LAB — GLUCOSE, CAPILLARY
Glucose-Capillary: 386 mg/dL — ABNORMAL HIGH (ref 70–99)
Glucose-Capillary: 348 mg/dL — ABNORMAL HIGH (ref 70–99)
Glucose-Capillary: 387 mg/dL — ABNORMAL HIGH (ref 70–99)
Glucose-Capillary: 406 mg/dL — ABNORMAL HIGH (ref 70–99)

## 2024-03-02 SURGERY — LEFT HEART CATH AND CORS/GRAFTS ANGIOGRAPHY
Anesthesia: LOCAL

## 2024-03-02 MED ORDER — ASPIRIN 81 MG PO CHEW: 81.0000 mg | CHEWABLE_TABLET | ORAL | Status: DC

## 2024-03-02 MED ORDER — HEPARIN SODIUM (PORCINE) 1000 UNIT/ML IJ SOLN
INTRAMUSCULAR | Status: AC
  Filled 2024-03-02: qty 10

## 2024-03-02 MED ORDER — METFORMIN HCL ER 500 MG PO TB24: 1000.0000 mg | ORAL_TABLET | Freq: Every day | ORAL | Status: AC

## 2024-03-02 MED ORDER — INSULIN ASPART 100 UNIT/ML IJ SOLN
10.0000 [IU] | Freq: Once | INTRAMUSCULAR | Status: AC
  Administered 2024-03-02 (×2): 10 [IU] via SUBCUTANEOUS
  Filled 2024-03-02: qty 1

## 2024-03-02 MED ORDER — SODIUM CHLORIDE 0.9% FLUSH: 3.0000 mL | INTRAVENOUS | Status: DC | PRN

## 2024-03-02 MED ORDER — FREE WATER
500.0000 mL | Freq: Once | Status: AC
  Administered 2024-03-02 (×2): 500 mL via ORAL

## 2024-03-02 MED ORDER — MIDAZOLAM HCL 2 MG/2ML IJ SOLN
INTRAMUSCULAR | Status: DC | PRN
  Administered 2024-03-02 (×2): 2 mg via INTRAVENOUS

## 2024-03-02 MED ORDER — LIDOCAINE HCL (PF) 1 % IJ SOLN
INTRAMUSCULAR | Status: DC | PRN
  Administered 2024-03-02 (×2): 2 mL via INTRADERMAL

## 2024-03-02 MED ORDER — VERAPAMIL HCL 2.5 MG/ML IV SOLN
INTRAVENOUS | Status: DC | PRN
  Administered 2024-03-02 (×2): 10 mL via INTRA_ARTERIAL

## 2024-03-02 MED ORDER — SODIUM CHLORIDE 0.9 % IV SOLN: 250.0000 mL | INTRAVENOUS | Status: DC | PRN

## 2024-03-02 MED ORDER — VERAPAMIL HCL 2.5 MG/ML IV SOLN
INTRAVENOUS | Status: AC
  Filled 2024-03-02: qty 2

## 2024-03-02 MED ORDER — INSULIN ASPART 100 UNIT/ML IJ SOLN
8.0000 [IU] | Freq: Once | INTRAMUSCULAR | Status: AC
  Administered 2024-03-02 (×2): 8 [IU] via SUBCUTANEOUS
  Filled 2024-03-02: qty 1

## 2024-03-02 MED ORDER — IOHEXOL 350 MG/ML SOLN
INTRAVENOUS | Status: DC | PRN
  Administered 2024-03-02 (×2): 60 mL

## 2024-03-02 MED ORDER — SODIUM CHLORIDE 0.9% FLUSH: 3.0000 mL | Freq: Two times a day (BID) | INTRAVENOUS | Status: DC

## 2024-03-02 MED ORDER — HEPARIN SODIUM (PORCINE) 1000 UNIT/ML IJ SOLN
INTRAMUSCULAR | Status: DC | PRN
  Administered 2024-03-02 (×2): 4000 [IU] via INTRAVENOUS

## 2024-03-02 MED ORDER — FREE WATER: 500.0000 mL | Freq: Once | Status: DC

## 2024-03-02 MED ORDER — MIDAZOLAM HCL 2 MG/2ML IJ SOLN
INTRAMUSCULAR | Status: AC
  Filled 2024-03-02: qty 2

## 2024-03-02 MED ORDER — FENTANYL CITRATE (PF) 100 MCG/2ML IJ SOLN
INTRAMUSCULAR | Status: DC | PRN
  Administered 2024-03-02 (×2): 25 ug via INTRAVENOUS

## 2024-03-02 MED ORDER — LIDOCAINE HCL (PF) 1 % IJ SOLN
INTRAMUSCULAR | Status: AC
  Filled 2024-03-02: qty 30

## 2024-03-02 MED ORDER — HEPARIN (PORCINE) IN NACL 1000-0.9 UT/500ML-% IV SOLN
INTRAVENOUS | Status: DC | PRN
  Administered 2024-03-02 (×4): 500 mL

## 2024-03-02 MED ORDER — FENTANYL CITRATE (PF) 100 MCG/2ML IJ SOLN
INTRAMUSCULAR | Status: AC
  Filled 2024-03-02: qty 2

## 2024-03-02 MED ORDER — HYDRALAZINE HCL 20 MG/ML IJ SOLN: 10.0000 mg | INTRAMUSCULAR | Status: DC | PRN

## 2024-03-02 MED ORDER — ONDANSETRON HCL 4 MG/2ML IJ SOLN: 4.0000 mg | Freq: Four times a day (QID) | INTRAMUSCULAR | Status: DC | PRN

## 2024-03-02 MED ORDER — ACETAMINOPHEN 325 MG PO TABS: 650.0000 mg | ORAL_TABLET | ORAL | Status: DC | PRN

## 2024-03-02 SURGICAL SUPPLY — 12 items
CATH INFINITI 5 FR IM (CATHETERS) IMPLANT
CATH INFINITI 5FR MULTPACK ANG (CATHETERS) IMPLANT
DEVICE RAD COMP TR BAND LRG (VASCULAR PRODUCTS) IMPLANT
ELECT DEFIB PAD ADLT CADENCE (PAD) IMPLANT
GLIDESHEATH SLEND SS 6F .021 (SHEATH) IMPLANT
GUIDEWIRE INQWIRE 1.5J.035X260 (WIRE) IMPLANT
KIT SINGLE USE MANIFOLD (KITS) IMPLANT
KIT SYRINGE INJ CVI SPIKEX1 (MISCELLANEOUS) IMPLANT
PACK CARDIAC CATHETERIZATION (CUSTOM PROCEDURE TRAY) ×1 IMPLANT
SET ATX-X65L (MISCELLANEOUS) IMPLANT
SHEATH PROBE COVER 6X72 (BAG) IMPLANT
STATION PROTECTION PRESSURIZED (MISCELLANEOUS) IMPLANT

## 2024-03-02 NOTE — Progress Notes (Signed)
 Danna Dunn,PA notified of CBG 410 ; no new orders and ok to d/c home

## 2024-03-02 NOTE — Interval H&P Note (Signed)
 History and Physical Interval Note:  03/02/2024 8:04 AM  Roy [name]  has presented today for surgery, with the diagnosis of abnormal myoview .  The various methods of treatment have been discussed with the patient and family. After consideration of risks, benefits and other options for treatment, the patient has consented to  Procedure(s): LEFT HEART CATH AND CORS/GRAFTS ANGIOGRAPHY (N/A) as a surgical intervention.  The patient's history has been reviewed, patient examined, no change in status, stable for surgery.  I have reviewed the patient's chart and labs.  Questions were answered to the patient's satisfaction.    Cath Lab Visit (complete for each Cath Lab visit)  Clinical Evaluation Leading to the Procedure:   ACS: Yes.    Non-ACS:    Anginal Classification: CCS III  Anti-ischemic medical therapy: No Therapy  Non-Invasive Test Results: Intermediate-risk stress test findings: cardiac mortality 1-3%/year  Prior CABG: Previous CABG       [facility]ude [facility] 03/02/2024 8:05 AM'

## 2024-03-02 NOTE — Discharge Instructions (Signed)
 NO METFORMIN  FOR 2 DAYS

## 2024-03-02 NOTE — Discharge Instructions (Signed)
NO METFORMIN FOR 2 DAYS 

## 2024-03-02 NOTE — Progress Notes (Addendum)
 CBG 348. Will give 10u SSI. Held antihyperglycemics for cath and also got prednisone , likely contributing. Per nurse otherwise feels fine. D/w Dr. Sw[place]iland, still ok to be dc post cath obs.  Addendum: f/u CBG after eating lunch is 387. D/w Dr. [place] -> will give another 10u and allow for DC. As above, suspect due to pre-cath disruption in meds + prednisone . Patient states this often happens when he is in the hospital and usually returns to normal at home. He will monitor at home and notify PCP if f/u CBG persistently elevated despite washout of prednisone . He will be instructed by short stay staff to still hold metformin  48 hr post cath given contrast exposure.

## 2024-03-02 NOTE — Telephone Encounter (Signed)
 Patient came in and signed the release of information and paid the $29 form fee.  Form in Dr. Gib box.

## 2024-03-15 ENCOUNTER — Encounter: Payer: Self-pay | Admitting: Nurse Practitioner

## 2024-03-15 ENCOUNTER — Ambulatory Visit: Attending: Nurse Practitioner | Admitting: Nurse Practitioner

## 2024-03-15 VITALS — BP 118/72 | HR 76 | Ht 69.0 in | Wt 198.0 lb

## 2024-03-15 DIAGNOSIS — Z951 Presence of aortocoronary bypass graft: Secondary | ICD-10-CM

## 2024-03-15 DIAGNOSIS — Z952 Presence of prosthetic heart valve: Secondary | ICD-10-CM | POA: Diagnosis not present

## 2024-03-15 DIAGNOSIS — I2510 Atherosclerotic heart disease of native coronary artery without angina pectoris: Secondary | ICD-10-CM | POA: Diagnosis not present

## 2024-03-15 DIAGNOSIS — I25118 Atherosclerotic heart disease of native coronary artery with other forms of angina pectoris: Secondary | ICD-10-CM | POA: Diagnosis not present

## 2024-03-15 DIAGNOSIS — E785 Hyperlipidemia, unspecified: Secondary | ICD-10-CM

## 2024-03-15 DIAGNOSIS — E1159 Type 2 diabetes mellitus with other circulatory complications: Secondary | ICD-10-CM

## 2024-03-15 MED ORDER — AMOXICILLIN 500 MG PO TABS
ORAL_TABLET | ORAL | 3 refills | Status: AC
Start: 2024-03-15 — End: ?

## 2024-03-15 NOTE — Progress Notes (Unsigned)
 Office Visit    Patient Name: Roy [name] Date of Encounter: 03/15/2024  Primary Care Provider:  Valma Carwin, MD Primary Cardiologist:  Peter Swaziland, MD  Chief Complaint    63 year old male with a history of CAD s/p DES-LCx in 2013, BMS x 2-LCx in 2013, CABG x 3 (LIMA-LAD, sequential SVG-OM1/OM 2) in 2014, aortic valve regurgitation s/p AVR in 2014, hyperlipidemia, and type 2 diabetes who presents for follow-up related to CAD.  Past Medical History    Past Medical History:  Diagnosis Date   Aortic insufficiency    a. 07/2012 s/p AVR: #25 Magnaease Bioprosthetic Valve (performed @ time of CABG).   Arthritis    CAD (coronary artery disease) cardiologist--- dr coni   a. 12/2011 NSTEMI/ PCI with DES to OM1;  b. 06/2012 NSTEMI/ PCI with BMS to mid LCX 100%->;  c. 07/2012 Cath: dissectied LM & LCX with patent LCX/OM stents->CABG x 3: LIMA->LAD, VG->OM1->OM2.;  nuclear study 09-12-2014 no ischemia and normal LVF   Diastolic CHF (HCC) 06/2012   Diverticulosis of colon    History of diverticulitis of colon    History of kidney stones    History of non-ST elevation myocardial infarction (NSTEMI) 2013   06/ 2013 s/p DES  and 12/ 2013  s/p BMS    History of skin cancer    04-13-2020  per pt has had multiiple areas burned off by dermatologist in office,  none have been biopsy'd   Lipoma of back    upper   Mixed hyperlipidemia    Moderate mitral regurgitation    with no stenosis per echo 03-05-2018   S/P aortic valve replacement 08/02/2012   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   LIMA to LAD,  SVG to OM1 ,  SVG to OM2   Type 2 diabetes mellitus (HCC)    followed by pcp  (04-13-2020  per pt checks blood sugar 3 times weekly,  fasting sugar-- 120--150)   Wears contact lenses    Past Surgical History:  Procedure Laterality Date   AORTIC VALVE REPLACEMENT  08/02/2012   Procedure: AORTIC VALVE REPLACEMENT (AVR);  Surgeon: Elspeth JAYSON Millers, MD;  Location: [facility] OR;  Service: Open Heart Surgery;   Laterality: N/A;   CARDIAC CATHETERIZATION  07/30/2012   dissection LM and CFx,  patent previous stents   CORONARY ANGIOGRAM  07/30/2012   Procedure: CORONARY ANGIOGRAM;  Surgeon: Lynwood Schilling, MD;  Location: [facility] CATH LAB;  Service: Cardiovascular;;   CORONARY ANGIOPLASTY WITH STENT PLACEMENT  12-22-2011  @[facility]    DES to OM1 (NSTEMI)   CORONARY ANGIOPLASTY WITH STENT PLACEMENT  07-01-2012  @[facility]    BMS to midCFx (NSTEMI)   CORONARY ARTERY BYPASS GRAFT  08/02/2012   Procedure: CORONARY ARTERY BYPASS GRAFTING (CABG);  Surgeon: Elspeth JAYSON Millers, MD;  Location: [facility] OR;  Service: Open Heart Surgery;  Laterality: N/A;   ENDOVEIN HARVEST OF GREATER SAPHENOUS VEIN  08/02/2012   Procedure: ENDOVEIN HARVEST OF GREATER SAPHENOUS VEIN;  Surgeon: Elspeth JAYSON Millers, MD;  Location: [facility] OR;  Service: Open Heart Surgery;  Laterality: Left;  upper and lower leg   EXTRACORPOREAL SHOCK WAVE LITHOTRIPSY  09/2010   INTRAOPERATIVE TRANSESOPHAGEAL ECHOCARDIOGRAM  08/02/2012   Procedure: INTRAOPERATIVE TRANSESOPHAGEAL ECHOCARDIOGRAM;  Surgeon: Elspeth JAYSON Millers, MD;  Location: [facility] OR;  Service: Open Heart Surgery;  Laterality: N/A;   KERATOTOMIES Bilateral 1985   both eyes   KNEE ARTHROSCOPY  06/28/2012   Procedure: ARTHROSCOPY KNEE;  Surgeon: Marcey Raman, MD;  Location: [facility] OR;  Service: Orthopedics;  Laterality: Left;   KNEE ARTHROSCOPY Left x1  after 2013 (unsure date)   LEFT HEART CATH AND CORS/GRAFTS ANGIOGRAPHY N/A 03/02/2024   Procedure: LEFT HEART CATH AND CORS/GRAFTS ANGIOGRAPHY;  Surgeon: Swaziland, Peter M, MD;  Location: [facility] INVASIVE CV LAB;  Service: Cardiovascular;  Laterality: N/A;   ROTATOR CUFF REPAIR Bilateral 2007  and 11/ 2017   SEPTOPLASTY  1990s   SHOULDER ARTHROSCOPY Left 12/2018   w/ decompression, debridement and manipulation   TEE WITHOUT CARDIOVERSION  07/28/2012   Procedure: TRANSESOPHAGEAL ECHOCARDIOGRAM (TEE);  Surgeon: Aleene JINNY Passe, MD;  Location: [facility] ENDOSCOPY;  Service: Cardiovascular;   Laterality: N/A;   TONSILLECTOMY  age 33   ULNAR TUNNEL RELEASE Right 1990s    Allergies  Allergies  Allergen Reactions   Contrast Media [Iodinated Contrast Media] Other (See Comments)    Pt'shad streaks up and down his arms   Topiramate Other (See Comments)   Gadolinium Derivatives Itching and Swelling    MultiHance  adminstered. Itching of eyes, ears, throat. Tongue thickness.     Labs/Other Studies Reviewed    The following studies were reviewed today:  Cardiac Studies & Procedures   ______________________________________________________________________________________________ CARDIAC CATHETERIZATION  CARDIAC CATHETERIZATION 03/02/2024  Conclusion   Prox RCA lesion is 99% stenosed.   Prox RCA to Mid RCA lesion is 99% stenosed.   Mid LAD lesion is 100% stenosed.   1st Diag lesion is 80% stenosed.   2nd Diag lesion is 80% stenosed.   1st Mrg-1 lesion is 20% stenosed.   1st Mrg-2 lesion is 100% stenosed.   Mid Cx to Dist Cx lesion is 100% stenosed.   LPDA lesion is 100% stenosed.   Dist LAD lesion is 90% stenosed.   LIMA graft was visualized by angiography and is normal in caliber.   Seq SVG- OM1 and OM2 graft was visualized by angiography and is large.   The graft exhibits no disease.   The graft exhibits no disease.  Left dominant circulation Severe 3 vessel occlusive CAD Patent LIMA to the LAD Patent sequential SVG to OM1 and OM2  Bypass grafts are widely patent. He does have small vessel disease involving 2 small diagonal branches, distal LAD at the apex, nondominant RCA and occluded left PDA supplied by collaterals. These are all too small for PCI. Recommend medical therapy.  Findings Coronary Findings Diagnostic  Dominance: Left  Left Anterior Descending Mid LAD lesion is 100% stenosed. Dist LAD lesion is 90% stenosed.  First Diagonal Branch Vessel is small in size. 1st Diag lesion is 80% stenosed.  Second Diagonal Branch Vessel is small in  size. 2nd Diag lesion is 80% stenosed.  Left Circumflex Vessel is large. Mid Cx to Dist Cx lesion is 100% stenosed. The lesion was previously treated using a bare metal stent over 2 years ago.  First Obtuse Marginal Branch 1st Mrg-1 lesion is 20% stenosed. The lesion was previously treated using a bare metal stent over 2 years ago. 1st Mrg-2 lesion is 100% stenosed.  Third Obtuse Marginal Branch Vessel is large in size.  Left Posterior Descending Artery Collaterals LPDA filled by collaterals from 2nd Sept.  LPDA lesion is 100% stenosed.  Left Posterior Atrioventricular Artery  Right Coronary Artery Vessel is small. Prox RCA lesion is 99% stenosed. Prox RCA to Mid RCA lesion is 99% stenosed.  Sequential Jump Graft Graft To 1st Mrg, 3rd Mrg Seq SVG- OM1 and OM2 graft was visualized by angiography and is large.  The graft exhibits no disease.  LIMA LIMA Graft To Dist LAD LIMA graft was visualized by angiography and is normal in caliber.  The graft exhibits no disease.  Intervention  No interventions have been documented.   STRESS TESTS  MYOCARDIAL PERFUSION IMAGING 02/22/2024  Interpretation Summary   Findings are consistent with ischemia and infarction. The study is intermediate risk.   The patient walked on the treadmill for 10:30 minutes. He requested to stop due to knee pain and was unable to achieve target heart rate. A pharmacological stress test was performed using IV Lexiscan  0.4mg  over 10 seconds performed without concurrent submaximal exercise.   No ST deviation was noted. The ECG was not diagnostic due to pharmacologic protocol.   LV perfusion is abnormal. There is evidence of ischemia. There is no evidence of infarction. Defect 1: There is a medium defect with mild reduction in uptake present in the mid to basal anterior location(s) that is reversible. There is normal wall motion in the defect area. Consistent with ischemia.   Left ventricular function is normal.  Nuclear stress EF: 71%. The left ventricular ejection fraction is hyperdynamic (>65%). End diastolic cavity size is normal. End systolic cavity size is normal.   CT images were obtained for attenuation correction and were examined for the presence of coronary calcium  when appropriate.   Coronary calcium  was present on the attenuation correction CT images. Moderate coronary calcifications were present. Coronary calcifications were present in the left anterior descending artery and left circumflex artery distribution(s).   Prior study available for comparison from 06/17/2022. There are changes compared to prior study which appear to be new.  Mid to basal anterior wall with mild reversible perfusion defect on attenuation corrected images consistent with ischemia.   ECHOCARDIOGRAM  ECHOCARDIOGRAM COMPLETE 07/17/2022  Narrative ECHOCARDIOGRAM REPORT    Patient Name:   Roy [name] Date of Exam: 07/17/2022 Medical Rec #:  [id number]      Height:       69.0 in Accession #:    [id number]     Weight:       193.0 lb Date of Birth:  Apr 09, 1961      BSA:          2.035 m Patient Age:    61 years       BP:           132/84 mmHg Patient Gender: M              HR:           84 bpm. Exam Location:  [address]  Procedure: 2D Echo, Cardiac Doppler and Color Doppler  Indications:    S/p AVR Z95.2  History:        Patient has prior history of Echocardiogram examinations, most recent 03/05/2018. CAD and Previous Myocardial Infarction, AVR (25mm [employer]), Signs/Symptoms:MR; Risk Factors:Diabetes, Hypertension and Dyslipidemia. Aortic Valve: 25 mm [facility] Ease valve is present in the aortic position.  Sonographer:    Elsie Bohr RDCS Referring Phys: [id number] TESSA N CONTE  IMPRESSIONS   1. Left ventricular ejection fraction, by estimation, is 60 to 65%. The left ventricle has normal function. The left ventricle has no regional wall motion abnormalities. There is mild concentric  left ventricular hypertrophy. Left ventricular diastolic parameters are consistent with Grade I diastolic dysfunction (impaired relaxation). 2. Right ventricular systolic function is normal. The right ventricular size is normal. Tricuspid regurgitation signal is inadequate for assessing PA pressure. 3. The mitral valve is normal in structure. Trivial  mitral valve regurgitation. 4. The aortic valve has been repaired/replaced. Aortic valve regurgitation is not visualized. There is a 25 mm [employer] valve present in the aortic position. Echo findings are consistent with normal structure and function of the aortic valve prosthesis. Aortic valve mean gradient measures 10.0 mmHg. Aortic valve Vmax measures 2.30 m/s. EOA 2.1cm2, DI 0.6. 5. Aortic dilatation noted. There is borderline dilatation of the ascending aorta, measuring 37 mm. 6. The inferior vena cava is normal in size with greater than 50% respiratory variability, suggesting right atrial pressure of 3 mmHg.  Comparison(s): No significant change from prior TTE in 2019. Prior mean AoV gradient .  FINDINGS Left Ventricle: Left ventricular ejection fraction, by estimation, is 60 to 65%. The left ventricle has normal function. The left ventricle has no regional wall motion abnormalities. The left ventricular internal cavity size was normal in size. There is mild concentric left ventricular hypertrophy. Left ventricular diastolic parameters are consistent with Grade I diastolic dysfunction (impaired relaxation).  Right Ventricle: The right ventricular size is normal. No increase in right ventricular wall thickness. Right ventricular systolic function is normal. Tricuspid regurgitation signal is inadequate for assessing PA pressure.  Left Atrium: Left atrial size was normal in size.  Right Atrium: Right atrial size was normal in size.  Pericardium: There is no evidence of pericardial effusion.  Mitral Valve: The mitral valve is  normal in structure. Trivial mitral valve regurgitation.  Tricuspid Valve: The tricuspid valve is normal in structure. Tricuspid valve regurgitation is not demonstrated.  Aortic Valve: DI 0.6, EOA 2.1cm2. The aortic valve has been repaired/replaced. Aortic valve regurgitation is not visualized. Aortic valve mean gradient measures 10.0 mmHg. Aortic valve peak gradient measures 21.2 mmHg. Aortic valve area, by VTI measures 1.68 cm. There is a 25 mm [employer] valve present in the aortic position. Echo findings are consistent with normal structure and function of the aortic valve prosthesis.  Pulmonic Valve: The pulmonic valve was normal in structure. Pulmonic valve regurgitation is trivial.  Aorta: Aortic dilatation noted. There is borderline dilatation of the ascending aorta, measuring 37 mm.  Venous: The inferior vena cava is normal in size with greater than 50% respiratory variability, suggesting right atrial pressure of 3 mmHg.  IAS/Shunts: The atrial septum is grossly normal.   LEFT VENTRICLE PLAX 2D LVIDd:         4.00 cm   Diastology LVIDs:         2.50 cm   LV e' medial:    8.38 cm/s LV PW:         1.60 cm   LV E/e' medial:  9.8 LV IVS:        1.60 cm   LV e' lateral:   9.79 cm/s LVOT diam:     2.10 cm   LV E/e' lateral: 8.4 LV SV:         78 LV SV Index:   38 LVOT Area:     3.46 cm   RIGHT VENTRICLE             IVC RV S prime:     12.50 cm/s  IVC diam: 1.30 cm TAPSE (M-mode): 1.5 cm  LEFT ATRIUM             Index        RIGHT ATRIUM           Index LA diam:        4.50 cm 2.21 cm/m   RA  Pressure: 3.00 mmHg LA Vol (A2C):   53.2 ml 26.14 ml/m  RA Area:     15.40 cm LA Vol (A4C):   53.3 ml 26.19 ml/m  RA Volume:   35.20 ml  17.30 ml/m LA Biplane Vol: 53.0 ml 26.04 ml/m AORTIC VALVE AV Area (Vmax):    1.52 cm AV Area (Vmean):   1.62 cm AV Area (VTI):     1.68 cm AV Vmax:           230.00 cm/s AV Vmean:          144.333 cm/s AV VTI:            0.462  m AV Peak Grad:      21.2 mmHg AV Mean Grad:      10.0 mmHg LVOT Vmax:         101.00 cm/s LVOT Vmean:        67.300 cm/s LVOT VTI:          0.224 m LVOT/AV VTI ratio: 0.49  AORTA Ao Root diam: 3.30 cm Ao Asc diam:  3.70 cm  MITRAL VALVE                TRICUSPID VALVE MV Area (PHT): 2.93 cm     Estimated RAP:  3.00 mmHg MV Decel Time: 259 msec MV E velocity: 82.30 cm/s   SHUNTS MV A velocity: 101.00 cm/s  Systemic VTI:  0.22 m MV E/A ratio:  0.81         Systemic Diam: 2.10 cm  Powell Sorrow MD Electronically signed by Powell Sorrow MD Signature Date/Time: 07/17/2022/2:18:50 PM    Final      CT SCANS  CT CORONARY MORPH W/CTA COR W/SCORE 07/30/2012  Narrative Cardiac CT:  Indication: R/O dissection  Protocol:  The patient was fairly sick with recent MI, ? SBE and severe AR.  BP was low and he could only be premedicated with 2.5mg  of iv lopressor .  No nitro was given.  Dye allergy prophylaxis was also given.  Average HR during scan was in the 90;s.  A 120 kV retrospective scan was done.  Patient received 80 cc of contrast. He was scanned on a Philips 256 slice scanner with gantry rotation speed 270 msec and collimation .9mm.  The 3D data set was reviewed using MIP, VRT and MPR modes  Findings: There was no evidence of sinus of valsalva aneurysm, or aortic dissection  Sinus of Valsalva: 3.2 cm Ascending Aorta: 3.2 cm Arch: 2.4 cm Descending thoracic aorta: 2.2 cm  The great vessels arose from the arch normally with no coarctation.  The Aortic valve appeared tri-leaflet.  Although not well seen there appeared to be a central area of malcoaptation between the noncoronary cusp and right coronary cusp.  No obvious vegetation was seen.  The main pulmonary artery was mildly dilated at 3.0 cm  There was no pericardial effusion.  Coronary arteries:  Left dominant with no anomalies  LM- calcified especially distally near take off of circumflex with no  obvious dissection  LAD- patent with non obstructive calcific disease with distal vessel poorly visualized  Diagonals:  2 small diagonals visualize without critical disease  Circumflex- dominant and patent but with large stent in mid vessel making analysis impossible  OM1: Patent with stent  RCA: nondominant and small with likely obstructive disease in mid vessel  See separate report from [facility] for interpretation of limited soft tissue and lung windows  Impression:  1)  No aortic dissection Ascending aorta 3.2 cm see other measurements above  2)    Trileaflet AV with central malcoaptation and no vegetation seen on poor quality study 3)    No LM dissection appreciated with dense area of calcification at circumflex take off 4)    Patent stent to mid left dominant circumflex artery 5)    No pericardial effusion 6)    Mildly dilated main pulmonary artery  Maude Emmer MD [facility]   Original Report Authenticated By: Maude Emmer, M.D.     ______________________________________________________________________________________________     Recent Labs: 05/24/2023: ALT 16 05/26/2023: Magnesium  1.7 02/24/2024: BUN 17; Creatinine, Ser 1.40; Hemoglobin 13.4; Platelets 158; Potassium 4.8; Sodium 138  Recent Lipid Panel    Component Value Date/Time   CHOL 157 07/31/2012 0319   CHOL 105 07/01/2012 0019   TRIG 39 07/31/2012 0319   TRIG 47 07/01/2012 0019   HDL 54 07/31/2012 0319   HDL 35 (L) 07/01/2012 0019   CHOLHDL 2.9 07/31/2012 0319   VLDL 8 07/31/2012 0319   VLDL 9 07/01/2012 0019   LDLCALC 95 07/31/2012 0319   LDLCALC 61 07/01/2012 0019    History of Present Illness    63 year old male with the above past medical history including CAD s/p DES-LCx in 2013, BMS x 2-LCx in 2013, CABG x 3 (LIMA-LAD, sequential SVG-OM1/OM 2) in 2014, aortic valve regurgitation s/p AVR in 2014, hyperlipidemia, and type 2 diabetes.  Previously followed by Dr. Dann. He has a  history of CAD s/p MI x 2 due to LCx occlusion s/p emergent stenting.  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  He underwent emergent CABG x 2, AVR in 07/2012.  Myoview  in November 2023 was normal.  Echocardiogram in December 2023 showed stable aortic valve prosthesis.  He was last seen in the office on 02/11/2024 and reported new symptoms of dyspnea, atypical chest pain. Stress Myoview  in 03/09/2024 showed evidence of ischemia. Cardiac catheterization on 03/02/2024 revealed severe native three-vessel occlusive CAD, widely patent bypass grafts, small vessel disease involving 2 small diagonal branches, distal LAD at the apex, nondominant RCA occluded left PDA supplied by collaterals, too small for PCI. Medical therapy was recommended.  He presents today for follow-up.  Since his last visit and since his procedure He has done well from a cardiac standpoint.  He denies any symptoms concerning for angina.  He has noted some mild discomfort and bruising to his left arm post cath.  Otherwise , he reports feeling well.  Will refer to Pharm.D. for consideration of PCSK9 inhibitor. Will request most recent labs from PCP. Prescription sent for amoxicillin . Follow-up in 6 months, sooner if needed. He may return to work, no restrictions.   1. CAD: S/p DES-LCx in 2013, BMS x 2-LCx in 2013, CABG x 3 (LIMA-LAD, sequential SVG-OM1/OM 2) in 2014.  Cardiac catheterization on 03/02/2024 revealed severe native three-vessel occlusive CAD, widely patent bypass grafts, small vessel disease involving 2 small diagonal branches, distal LAD at the apex, nondominant RCA occluded left PDA supplied by collaterals, too small for PCI. Medical therapy was recommended. Continue aspirin , pravastatin , Nexlizet .   2. 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 S/p AVR in 2014. Echocardiogram in December 2023 showed stable  aortic valve prosthesis. Continue SBE prophylaxis.   3. Hyperlipidemia: No recent LDL on file. Will update fasting lipids.  Continue pravastatin , Nexlizet . If  LDL remains elevated above goal, consider escalation of statin therapy.  4. Type 2 diabetes: A1c was 9.1 in 05/2023. Monitored and managed per PCP.   5. Disposition: Follow-up in 6 months.   Home Medications    Current Outpatient Medications  Medication Sig Dispense Refill   aspirin  EC 81 MG tablet Take 1 tablet (81 mg total) by mouth daily. 90 tablet 3   Bempedoic Acid-Ezetimibe (NEXLIZET ) 180-10 MG TABS Take 1 tablet by mouth daily. 90 tablet 3   dapagliflozin  propanediol (FARXIGA ) 10 MG TABS tablet Take 1 tablet (10 mg total) by mouth daily.     diphenhydrAMINE  (BENADRYL ) 25 MG tablet Take 2 tablets (50 mg total) by mouth once for 1 dose before leaving house for cardiac cath. 2 tablet 0   metFORMIN  (GLUCOPHAGE -XR) 500 MG 24 hr tablet Take 2 tablets (1,000 mg total) by mouth daily with breakfast.     nitroGLYCERIN  (NITROSTAT ) 0.4 MG SL tablet Place 0.4 mg under the tongue every 5 (five) minutes as needed for chest pain.     ONETOUCH ULTRA test strip      pioglitazone  (ACTOS ) 45 MG tablet Take 45 mg by mouth daily.     pravastatin  (PRAVACHOL ) 40 MG tablet Take 40 mg by mouth daily.     Semaglutide, 1 MG/DOSE, (OZEMPIC, 1 MG/DOSE,) 2 MG/1.5ML SOPN Inject 2 mg into the skin once a week.     tetrahydrozoline 0.05 % ophthalmic solution Place 1 drop into both eyes daily as needed (red eye).     tiZANidine (ZANAFLEX) 4 MG tablet Take 4 mg by mouth at bedtime as needed.     Turmeric (QC TUMERIC COMPLEX PO) Take 500 mg by mouth daily.     sildenafil (VIAGRA) 50 MG tablet Take 50 mg by mouth daily as needed for erectile dysfunction. (Patient not taking: Reported on 03/15/2024)     No current facility-administered medications for this visit.     Review of Systems    ***.  All other systems reviewed and are otherwise negative except as  noted above.    Physical Exam    VS:  BP (!) 160/84 (BP Location: Left Arm, Patient Position: Sitting, Cuff Size: Normal)   Pulse 76   Ht 5' 9 (1.753 m)   Wt 198 lb (89.8 kg)   SpO2 96%   BMI 29.24 kg/m  , BMI Body mass index is 29.24 kg/m.     GEN: Well nourished, well developed, in no acute distress. HEENT: normal. Neck: Supple, no JVD, carotid bruits, or masses. Cardiac: RRR, no murmurs, rubs, or gallops. No clubbing, cyanosis, edema.  Radials/DP/PT 2+ and equal bilaterally.  Respiratory:  Respirations regular and unlabored, clear to auscultation bilaterally. GI: Soft, nontender, nondistended, BS + x 4. MS: no deformity or atrophy. Skin: warm and dry, no rash. Neuro:  Strength and sensation are intact. Psych: Normal affect.  Accessory Clinical Findings    ECG personally reviewed by me today -    - no acute changes.   Lab Results  Component Value Date   WBC 6.0 02/24/2024   HGB 13.4 02/24/2024   HCT 41.1 02/24/2024   MCV 92 02/24/2024   PLT 158 02/24/2024   Lab Results  Component Value Date   CREATININE 1.40 (H) 02/24/2024   BUN 17 02/24/2024   NA 138 02/24/2024   K 4.8 02/24/2024   CL 101 02/24/2024   CO2 21 02/24/2024   Lab Results  Component Value Date   ALT 16 05/24/2023  AST 23 05/24/2023   ALKPHOS 40 05/24/2023   BILITOT 0.8 05/24/2023   Lab Results  Component Value Date   CHOL 157 07/31/2012   HDL 54 07/31/2012   LDLCALC 95 07/31/2012   TRIG 39 07/31/2012   CHOLHDL 2.9 07/31/2012    Lab Results  Component Value Date   HGBA1C 9.1 (H) 05/25/2023    Assessment & Plan    1.  ***  The patient's 1st BP is elevated (>139/89)*** Repeat BP and {Click to enter a 2nd BP Refresh Note  :1}   Damien JAYSON Braver, NP 03/15/2024, 8:43 AM

## 2024-03-15 NOTE — Patient Instructions (Addendum)
 Medication Instructions:  Amoxicillin  take 2 gm(4 tablets) 1 hour piror to dental procedures.  *If you need a refill on your cardiac medications before your next appointment, please call your pharmacy*  Lab Work: NONE ordered at this time of appointment   Testing/Procedures: NONE ordered at this time of appointment   Follow-Up: At [facility], you and your health needs are our priority.  As part of our continuing mission to provide you with exceptional heart care, our providers are all part of one team.  This team includes your primary Cardiologist (physician) and Advanced Practice Providers or APPs (Physician Assistants and Nurse Practitioners) who all work together to provide you with the care you need, when you need it.  Your next appointment:   6 month(s)  Provider:   Peter Swaziland, MD    We recommend signing up for the patient portal called MyChart.  Sign up information is provided on this After Visit Summary.  MyChart is used to connect with patients for Virtual Visits (Telemedicine).  Patients are able to view lab/test results, encounter notes, upcoming appointments, etc.  Non-urgent messages can be sent to your provider as well.   To learn more about what you can do with MyChart, go to [email or web address].   Other Instructions

## 2024-03-15 NOTE — Telephone Encounter (Signed)
 Completed disability form faxed to [employer] [facility] and scanned into chart.  Billing notified.

## 2024-03-16 ENCOUNTER — Encounter: Payer: Self-pay | Admitting: Nurse Practitioner

## 2024-04-22 ENCOUNTER — Other Ambulatory Visit (HOSPITAL_COMMUNITY): Payer: Self-pay

## 2024-04-22 ENCOUNTER — Ambulatory Visit: Attending: Cardiology | Admitting: Pharmacist

## 2024-04-22 ENCOUNTER — Encounter: Payer: Self-pay | Admitting: Pharmacist

## 2024-04-22 ENCOUNTER — Telehealth: Payer: Self-pay | Admitting: Pharmacist

## 2024-04-22 DIAGNOSIS — E7849 Other hyperlipidemia: Secondary | ICD-10-CM

## 2024-04-22 MED ORDER — REPATHA SURECLICK 140 MG/ML ~~LOC~~ SOAJ: 140.0000 mg | SUBCUTANEOUS | 3 refills | Status: AC

## 2024-04-22 NOTE — Progress Notes (Signed)
 Patient ID: Roy [name]                 DOB: June 22, 1961                    MRN: [id number]      HPI: Roy [name] is a 63 y.o. male patient referred to lipid clinic by Damien braver, NP. PMH is significant for hypertension,  Premature CAD at age of 63 DES-LCx in 2013, BMS x 2-LCx in 2013, CABG x 3 (LIMA-LAD, sequential SVG-OM1/OM 2) in 2014, aortic valve regurgitation s/p AVR in 2014, hyperlipidemia, and type 2 diabetes, hx of TIA  Patient presented today for lipid clinic. Reports one of his co-worker with same insurance is on Repatha and it is working well for him. In the past he was reluctant to injectables so he was put on Nexlizet  but now that he is on GLP1 for DM management he wants to consider Repatha. He eats healthy most part due to busy work schedule ends up eating out 2 times per week still pick healthy food and does not get time to do exercise.  Reviewed PCSK-9 inhibitors, .  Discussed mechanisms of action, dosing, side effects and potential decreases in LDL cholesterol.  Also reviewed cost information and potential options for patient assistance.   Current Medications: pravastatin  40 mg daily,  Nexlizet  80/10 mg daily  Intolerances: atorvastatin  20-40mg  and rosuvastatin  5, 10, and 20mg   Risk Factors: Premature CAD DES-LCx in 2013, BMS x 2-LCx in 2013, CABG x 3 (LIMA-LAD, sequential SVG-OM1/OM 2) in 2014, hypertension, T2DM  LDL goal: <55 mg/dl and TG <849 mg/dl  Last lab 93/7975 TC 797 TG 65, HDLc 45, VLDL 12, LDLc 145   TG 98, TC 179, HDL 53 LDL was 107 in 12/2023    Diet:  Eats healthy , due to work schedule eats out 2 times a week  Exercise:  None due to busy work schedule   Family History:  Relation Problem Comments  Mother (Deceased at age 62) Coronary artery disease   Lung cancer     Father (Alive) Heart disease   Migraines     Brother Hyperlipidemia     Paternal Grandmother Diabetes     Paternal Grandfather Heart attack     Other Hyperlipidemia father's side      Social History:   Alcohol: none  Smoking : none   Labs:  Lipid Panel     Component Value Date/Time   CHOL 157 07/31/2012 0319   CHOL 105 07/01/2012 0019   TRIG 39 07/31/2012 0319   TRIG 47 07/01/2012 0019   HDL 54 07/31/2012 0319   HDL 35 (L) 07/01/2012 0019   CHOLHDL 2.9 07/31/2012 0319   VLDL 8 07/31/2012 0319   VLDL 9 07/01/2012 0019   LDLCALC 95 07/31/2012 0319   LDLCALC 61 07/01/2012 0019    Past Medical History:  Diagnosis Date   Aortic insufficiency    a. 07/2012 s/p AVR: #25 Magnaease Bioprosthetic Valve (performed @ time of CABG).   Arthritis    CAD (coronary artery disease) cardiologist--- dr coni   a. 12/2011 NSTEMI/ PCI with DES to OM1;  b. 06/2012 NSTEMI/ PCI with BMS to mid LCX 100%->;  c. 07/2012 Cath: dissectied LM & LCX with patent LCX/OM stents->CABG x 3: LIMA->LAD, VG->OM1->OM2.;  nuclear study 09-12-2014 no ischemia and normal LVF   Diastolic CHF (HCC) 06/2012   Diverticulosis of colon    History of diverticulitis of colon  History of kidney stones    History of non-ST elevation myocardial infarction (NSTEMI) 2013   06/ 2013 s/p DES  and 12/ 2013  s/p BMS    History of skin cancer    04-13-2020  per pt has had multiiple areas burned off by dermatologist in office,  none have been biopsy'd   Lipoma of back    upper   Mixed hyperlipidemia    Moderate mitral regurgitation    with no stenosis per echo 03-05-2018   S/P aortic valve replacement 08/02/2012   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 Take 2 grams (4x 500mg  tablets) 1 hour before procedure. 4 tablet 3   aspirin  EC 81 MG tablet Take 1 tablet (81  mg total) by mouth daily. 90 tablet 3   Bempedoic Acid-Ezetimibe (NEXLIZET ) 180-10 MG TABS Take 1 tablet by mouth daily. 90 tablet 3   dapagliflozin  propanediol (FARXIGA ) 10 MG TABS tablet Take 1 tablet (10 mg total) by mouth daily.     diphenhydrAMINE  (BENADRYL ) 25 MG tablet Take 2 tablets (50 mg total) by mouth once for 1 dose before leaving house for cardiac cath. 2 tablet 0   metFORMIN  (GLUCOPHAGE -XR) 500 MG 24 hr tablet Take 2 tablets (1,000 mg total) by mouth daily with breakfast.     nitroGLYCERIN  (NITROSTAT ) 0.4 MG SL tablet Place 0.4 mg under the tongue every 5 (five) minutes as needed for chest pain.     ONETOUCH ULTRA test strip      pioglitazone  (ACTOS ) 45 MG tablet Take 45 mg by mouth daily.     pravastatin  (PRAVACHOL ) 40 MG tablet Take 40 mg by mouth daily.     Semaglutide, 1 MG/DOSE, (OZEMPIC, 1 MG/DOSE,) 2 MG/1.5ML SOPN Inject 2 mg into the skin once a week.     sildenafil (VIAGRA) 50 MG tablet Take 50 mg by mouth daily as needed for erectile dysfunction.     tetrahydrozoline 0.05 % ophthalmic solution Place 1 drop into both eyes daily as needed (red eye).     tiZANidine (ZANAFLEX) 4 MG tablet Take 4 mg by mouth at bedtime as needed.     Turmeric (QC TUMERIC COMPLEX PO) Take 500 mg by mouth daily.     No current facility-administered medications on file prior to visit.    Allergies  Allergen Reactions   Contrast Media [Iodinated Contrast Media] Other (See Comments)    Pt'shad streaks up and down his arms   Topiramate Other (See Comments)   Gadolinium Derivatives Itching and Swelling    MultiHance  adminstered. Itching of eyes, ears, throat. Tongue thickness.    Assessment/Plan:  1. Hyperlipidemia -  Problem  Hyperlipidemia   Hyperlipidemia Assessment:  LDL goal: <55 mg/dl last LDLc 892 mg/dl (93/7974) Tolerates Zetia and moderate/high intensity statins well without any side effects  Intolerance to high intensity/moderate intensity /low intensity  statins   Discussed next potential options (PCSK-9 inhibitors, bempedoic acid and inclisiran); cost, dosing efficacy, side effects   Plan: Continue taking pravastatin  40 mg daily,  Nexlizet  80/10 mg daily Start taking Repatha 140 mg every  14 days will repeat lab in 3 months  May de-escalate Nexlizet  in future if LDL at goal on PCSK9i and Statin     Thank you,  Robbi Blanch, Pharm.D [facility]. [facility] [address], [place], [place] [address] Phone: [phone]; Fax: [phone]

## 2024-04-22 NOTE — Assessment & Plan Note (Addendum)
 Assessment:  LDL goal: <55 mg/dl last LDLc 892 mg/dl (93/7974) Tolerates Zetia and moderate/high intensity statins well without any side effects  Intolerance to high intensity/moderate intensity /low intensity  statins  Discussed next potential options (PCSK-9 inhibitors, bempedoic acid and inclisiran); cost, dosing efficacy, side effects   Plan: Continue taking pravastatin  40 mg daily,  Nexlizet  80/10 mg daily Start taking Repatha 140 mg every 14 days will repeat lab in 3 months  May de-escalate Nexlizet  in future if LDL at goal on PCSK9i and Statin

## 2024-04-22 NOTE — Telephone Encounter (Signed)
Pharmacy Patient Advocate Encounter   Received notification from Physician's Office that prior authorization for REPATHA is required/requested.   Insurance verification completed.   The patient is insured through [facility] .   Per test claim: The current 28 day co-pay is, $45.  No PA needed at this time. This test claim was processed through [facility]- copay amounts may vary at other pharmacies due to pharmacy/plan contracts, or as the patient moves through the different stages of their insurance plan.

## 2024-05-27 ENCOUNTER — Other Ambulatory Visit: Payer: Self-pay

## 2024-05-30 MED ORDER — NEXLIZET 180-10 MG PO TABS: 1.0000 | ORAL_TABLET | Freq: Every day | ORAL | 2 refills | Status: AC

## 2024-07-24 ENCOUNTER — Other Ambulatory Visit: Payer: Self-pay

## 2024-07-24 ENCOUNTER — Emergency Department (HOSPITAL_BASED_OUTPATIENT_CLINIC_OR_DEPARTMENT_OTHER): Admit: 2024-07-24

## 2024-07-24 ENCOUNTER — Emergency Department (HOSPITAL_BASED_OUTPATIENT_CLINIC_OR_DEPARTMENT_OTHER)
Admission: EM | Admit: 2024-07-24 | Discharge: 2024-07-24 | Disposition: A | Discharge status: Home / Self Care | Source: Home / Self Care | Attending: Emergency Medicine | Admitting: Emergency Medicine

## 2024-07-24 ENCOUNTER — Encounter (HOSPITAL_BASED_OUTPATIENT_CLINIC_OR_DEPARTMENT_OTHER): Payer: Self-pay

## 2024-07-24 ENCOUNTER — Other Ambulatory Visit (HOSPITAL_BASED_OUTPATIENT_CLINIC_OR_DEPARTMENT_OTHER): Payer: Self-pay | Admitting: Emergency Medicine

## 2024-07-24 DIAGNOSIS — M79605 Pain in left leg: Secondary | ICD-10-CM

## 2024-07-24 DIAGNOSIS — Z7982 Long term (current) use of aspirin: Secondary | ICD-10-CM | POA: Insufficient documentation

## 2024-07-24 LAB — CBC WITH DIFFERENTIAL/PLATELET
Abs Immature Granulocytes: 0.02 K/uL (ref 0.00–0.07)
Basophils Absolute: 0 K/uL (ref 0.0–0.1)
Basophils Relative: 1 %
Eosinophils Absolute: 0.2 K/uL (ref 0.0–0.5)
Eosinophils Relative: 4 %
HCT: 33.8 % — ABNORMAL LOW (ref 39.0–52.0)
Hemoglobin: 11.7 g/dL — ABNORMAL LOW (ref 13.0–17.0)
Immature Granulocytes: 1 %
Lymphocytes Relative: 35 %
Lymphs Abs: 1.4 K/uL (ref 0.7–4.0)
MCH: 30.9 pg (ref 26.0–34.0)
MCHC: 34.6 g/dL (ref 30.0–36.0)
MCV: 89.2 fL (ref 80.0–100.0)
Monocytes Absolute: 0.5 K/uL (ref 0.1–1.0)
Monocytes Relative: 13 %
Neutro Abs: 2 K/uL (ref 1.7–7.7)
Neutrophils Relative %: 46 %
Platelets: 136 K/uL — ABNORMAL LOW (ref 150–400)
RBC: 3.79 MIL/uL — ABNORMAL LOW (ref 4.22–5.81)
RDW: 13 % (ref 11.5–15.5)
WBC: 4.1 K/uL (ref 4.0–10.5)
nRBC: 0 % (ref 0.0–0.2)

## 2024-07-24 LAB — BASIC METABOLIC PANEL WITH GFR
Anion gap: 11 (ref 5–15)
BUN: 22 mg/dL (ref 8–23)
CO2: 25 mmol/L (ref 22–32)
Calcium: 9.6 mg/dL (ref 8.9–10.3)
Chloride: 103 mmol/L (ref 98–111)
Creatinine, Ser: 1.22 mg/dL (ref 0.61–1.24)
GFR, Estimated: >60 mL/min
Glucose, Bld: 267 mg/dL — ABNORMAL HIGH (ref 70–99)
Potassium: 4.1 mmol/L (ref 3.5–5.1)
Sodium: 139 mmol/L (ref 135–145)

## 2024-07-24 LAB — CK: Total CK: 62 U/L (ref 49–397)

## 2024-07-24 LAB — D-DIMER, QUANTITATIVE: D-Dimer, Quant: 0.56 ug{FEU}/mL — ABNORMAL HIGH (ref 0.00–0.50)

## 2024-07-24 MED ORDER — IBUPROFEN 400 MG PO TABS
400.0000 mg | ORAL_TABLET | Freq: Once | ORAL | Status: AC
  Administered 2024-07-24: 400 mg via ORAL
  Filled 2024-07-24: qty 1

## 2024-07-24 MED ORDER — OXYCODONE-ACETAMINOPHEN 5-325 MG PO TABS
1.0000 | ORAL_TABLET | Freq: Once | ORAL | Status: AC
  Administered 2024-07-24: 1 via ORAL
  Filled 2024-07-24: qty 1

## 2024-07-24 NOTE — ED Triage Notes (Signed)
 Arrives POV. Pain from L groin to knee x2 days. Pain comes and goes. Patient c/f blood clot. Denies hx of blood clots. No redness, swelling on exam.

## 2024-07-24 NOTE — ED Provider Notes (Signed)
 Patient was given results for his ultrasound which was ordered for DVT.  I informed him that the ultrasound was negative and did not show any signs of DVT.  Reassured the patient.  Recommended patient to follow-up with primary care for further evaluation.  Answered questions.  Distal sensation tact and palpable pulses.  Patient is in agreement with plan.     Roy Dubois, PA-C 07/24/24 1412    Dreama Longs, MD 07/25/24 [phone]

## 2024-07-24 NOTE — ED Provider Notes (Signed)
 " [place] EMERGENCY DEPARTMENT AT [facility] Provider Note   CSN: [id number] Arrival date & time: 07/24/24  0110     Patient presents with: No chief complaint on file.   Roy [name] is a 64 y.o. male.   Patient presents to the emergency department for evaluation of left leg.  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  He did not, however, noticed any specific injury.  Patient reports that he has been having diffuse pain in his leg from groin down to the foot.  Pain was quite severe earlier but it is easing off.  He first noticed it 2 days ago.       Prior to Admission medications  Medication Sig Start Date End Date Taking? Authorizing Provider  amoxicillin  (AMOXIL ) 500 MG tablet Prophylaxis for dental procedure. Take 2 grams (4x 500mg  tablets) 1 hour before procedure. 03/15/24   Daneen Damien BROCKS, NP  aspirin  EC 81 MG tablet Take 1 tablet (81 mg total) by mouth daily. 05/18/19   Dunn, Dayna N, PA-C  Bempedoic Acid-Ezetimibe (NEXLIZET ) 180-10 MG TABS Take 1 tablet by mouth daily. 05/30/24   Jordan, Peter M, MD  dapagliflozin  propanediol (FARXIGA ) 10 MG TABS tablet Take 1 tablet (10 mg total) by mouth daily. 06/02/23   Gonfa, Taye T, MD  diphenhydrAMINE  (BENADRYL ) 25 MG tablet Take 2 tablets (50 mg total) by mouth once for 1 dose before leaving house for cardiac cath. 02/23/24 04/22/24  Jordan, Peter M, MD  Evolocumab  (REPATHA  SURECLICK) 140 MG/ML SOAJ Inject 140 mg into the skin every 14 (fourteen) days. 04/22/24   Jordan, Peter M, MD  metFORMIN  (GLUCOPHAGE -XR) 500 MG 24 hr tablet Take 2 tablets (1,000 mg total) by mouth daily with breakfast. 03/05/24   Jordan, Peter M, MD  nitroGLYCERIN  (NITROSTAT ) 0.4 MG SL tablet Place 0.4 mg under the tongue every 5 (five) minutes as needed for chest pain. 02/09/24   [provider]  [facility] ULTRA test strip  05/14/22   [provider]  pioglitazone  (ACTOS ) 45 MG tablet Take 45 mg by mouth daily.     [provider]  pravastatin  (PRAVACHOL ) 40 MG tablet Take 40 mg by mouth daily.    [provider]  Semaglutide, 1 MG/DOSE, (OZEMPIC, 1 MG/DOSE,) 2 MG/1.5ML SOPN Inject 2 mg into the skin once a week.    [provider]  sildenafil (VIAGRA) 50 MG tablet Take 50 mg by mouth daily as needed for erectile dysfunction.    [provider]  tetrahydrozoline 0.05 % ophthalmic solution Place 1 drop into both eyes daily as needed (red eye).    [provider]  tiZANidine (ZANAFLEX) 4 MG tablet Take 4 mg by mouth at bedtime as needed. 01/19/24   [provider]  Turmeric (QC TUMERIC COMPLEX PO) Take 500 mg by mouth daily.    [provider]    Allergies: Contrast media [iodinated contrast media], Topiramate, and Gadolinium derivatives    Review of Systems  Updated Vital Signs BP (!) 147/89 (BP Location: Right Arm)   Pulse 85   Temp 97.9 F (36.6 C) (Oral)   Resp 18   SpO2 98%   Physical Exam Vitals and nursing note reviewed.  Constitutional:      General: He is not in acute distress.    Appearance: He is well-developed.  HENT:     Head: Normocephalic and atraumatic.     Mouth/Throat:     Mouth: Mucous membranes are moist.  Eyes:     General: Vision grossly intact. Gaze aligned appropriately.     Extraocular Movements: Extraocular movements intact.     Conjunctiva/sclera: Conjunctivae normal.  Cardiovascular:     Rate and Rhythm: Normal rate and regular rhythm.     Pulses:          Dorsalis pedis pulses are 1+ on the left side.     Heart sounds: Normal heart sounds, S1 normal and S2 normal. No murmur heard.    No friction rub. No gallop.  Pulmonary:     Effort: Pulmonary effort is normal. No respiratory distress.     Breath sounds: Normal breath sounds.  Abdominal:     Palpations: Abdomen is soft.     Tenderness: There is no abdominal tenderness. There is no guarding or rebound.     Hernia: No hernia is present.   Musculoskeletal:        General: No swelling.     Cervical back: Full passive range of motion without pain, normal range of motion and neck supple. No pain with movement, spinous process tenderness or muscular tenderness. Normal range of motion.     Left hip: Normal. No deformity. Normal range of motion.     Left upper leg: No swelling or tenderness.     Left knee: No swelling, deformity, effusion or erythema. Normal range of motion.     Right lower leg: No edema.     Left lower leg: No swelling. No edema.  Skin:    General: Skin is warm and dry.     Capillary Refill: Capillary refill takes less than 2 seconds.     Findings: No ecchymosis, erythema, lesion or wound.  Neurological:     Mental Status: He is alert and oriented to person, place, and time.     GCS: GCS eye subscore is 4. GCS verbal subscore is 5. GCS motor subscore is 6.     Cranial Nerves: Cranial nerves 2-12 are intact.     Sensory: Sensation is intact.     Motor: Motor function is intact. No weakness or abnormal muscle tone.     Coordination: Coordination is intact.  Psychiatric:        Mood and Affect: Mood normal.        Speech: Speech normal.        Behavior: Behavior normal.     (all labs ordered are listed, but only abnormal results are displayed) Labs Reviewed  CBC WITH DIFFERENTIAL/PLATELET - Abnormal; Notable for the following components:      Result Value   RBC 3.79 (*)    Hemoglobin 11.7 (*)    HCT 33.8 (*)    Platelets 136 (*)    All other components within normal limits  D-DIMER, QUANTITATIVE - Abnormal; Notable for the following components:   D-Dimer, Quant 0.56 (*)    All other components within normal limits  BASIC METABOLIC PANEL WITH GFR  CK    EKG: None  Radiology: No results found.   Procedures   Medications Ordered in the ED  oxyCODONE -acetaminophen  (PERCOCET/ROXICET) 5-325 MG per tablet 1 tablet (has no administration in time range)  ibuprofen  (ADVIL ) tablet 400 mg (has no  administration in time range)                                    Medical Decision Making Amount and/or Complexity of Data Reviewed Labs: ordered. Decision-making details  documented in ED Course.  Risk Prescription drug management.   Differential diagnosis considered includes, but not limited to:  Orthopedic injury; arthritis; septic arthritis; ischemic limb; DVT; cellulitis; phlebitis  Presents to the emergency department for evaluation of left leg pain.  Pain has been fairly diffuse down the entire leg.  No back pain or evidence of radiculopathy.  There are no skin changes to suggest infection.  Patient has distal sensation and palpable pulses.  No concern for ischemic leg.  Patient does perform a fairly demanding job, likely has some musculoskeletal pain.  Normal range of motion, no swelling or warmth of the joints to suggest septic arthritis.  Patient concerned about blood clot.  A D-dimer was drawn which is very slightly elevated at 0.56, however age adjustment makes this normal.  This is felt to be very low risk for DVT.  Will bring back for outpatient ultrasound to confirm, based on low risk does not require empiric anticoagulation.     Final diagnoses:  Pain of left lower extremity    ED Discharge Orders          Ordered    US  Venous Img Lower Unilateral Left        07/24/24 0214               Haze Lonni PARAS, MD 07/24/24 0215  "
